# Patient Record
Sex: Male | Born: 1965
Health system: Southern US, Community
[De-identification: ages and names within clinical notes are randomized; demographics above are authoritative.]

## PROBLEM LIST (undated history)

## (undated) DIAGNOSIS — R0789 Other chest pain: Secondary | ICD-10-CM

## (undated) DIAGNOSIS — M8580 Other specified disorders of bone density and structure, unspecified site: Secondary | ICD-10-CM

## (undated) DIAGNOSIS — E669 Obesity, unspecified: Secondary | ICD-10-CM

## (undated) DIAGNOSIS — F17200 Nicotine dependence, unspecified, uncomplicated: Secondary | ICD-10-CM

## (undated) DIAGNOSIS — J45909 Unspecified asthma, uncomplicated: Secondary | ICD-10-CM

## (undated) DIAGNOSIS — K760 Fatty (change of) liver, not elsewhere classified: Secondary | ICD-10-CM

## (undated) DIAGNOSIS — E785 Hyperlipidemia, unspecified: Secondary | ICD-10-CM

## (undated) DIAGNOSIS — I10 Essential (primary) hypertension: Secondary | ICD-10-CM

## (undated) DIAGNOSIS — C6290 Malignant neoplasm of unspecified testis, unspecified whether descended or undescended: Secondary | ICD-10-CM

## (undated) DIAGNOSIS — C439 Malignant melanoma of skin, unspecified: Secondary | ICD-10-CM

## (undated) DIAGNOSIS — Z0000 Encounter for general adult medical examination without abnormal findings: Secondary | ICD-10-CM

## (undated) DIAGNOSIS — F411 Generalized anxiety disorder: Secondary | ICD-10-CM

## (undated) DIAGNOSIS — I517 Cardiomegaly: Secondary | ICD-10-CM

## (undated) DIAGNOSIS — E119 Type 2 diabetes mellitus without complications: Secondary | ICD-10-CM

## (undated) DIAGNOSIS — C4441 Basal cell carcinoma of skin of scalp and neck: Secondary | ICD-10-CM

## (undated) DIAGNOSIS — I38 Endocarditis, valve unspecified: Secondary | ICD-10-CM

## (undated) DIAGNOSIS — M109 Gout, unspecified: Secondary | ICD-10-CM

## (undated) DIAGNOSIS — H9319 Tinnitus, unspecified ear: Secondary | ICD-10-CM

## (undated) HISTORY — DX: Fatty (change of) liver, not elsewhere classified: K76.0

## (undated) HISTORY — DX: Essential (primary) hypertension: I10

## (undated) HISTORY — DX: Type 2 diabetes mellitus without complications: E11.9

## (undated) HISTORY — DX: Generalized anxiety disorder: F41.1

## (undated) HISTORY — DX: Tinnitus, unspecified ear: H93.19

## (undated) HISTORY — DX: Unspecified asthma, uncomplicated: J45.909

## (undated) HISTORY — PX: OTHER SURGICAL HISTORY: SHX169

## (undated) HISTORY — DX: Obesity, unspecified: E66.9

## (undated) HISTORY — DX: Nicotine dependence, unspecified, uncomplicated: F17.200

## (undated) HISTORY — PX: COLONOSCOPY: SHX174

## (undated) HISTORY — PX: POLYPECTOMY: SHX149

## (undated) HISTORY — DX: Basal cell carcinoma of skin of scalp and neck: C44.41

## (undated) HISTORY — DX: Hyperlipidemia, unspecified: E78.5

## (undated) HISTORY — PX: BASAL CELL CARCINOMA EXCISION: SHX1214

## (undated) HISTORY — PX: SKIN CANCER EXCISION: SHX779

## (undated) HISTORY — DX: Other specified disorders of bone density and structure, unspecified site: M85.80

## (undated) HISTORY — DX: Encounter for general adult medical examination without abnormal findings: Z00.00

## (undated) HISTORY — DX: Endocarditis, valve unspecified: I38

## (undated) HISTORY — DX: Other chest pain: R07.89

## (undated) HISTORY — DX: Malignant melanoma of skin, unspecified: C43.9

## (undated) HISTORY — DX: Gout, unspecified: M10.9

## (undated) HISTORY — DX: Malignant neoplasm of unspecified testis, unspecified whether descended or undescended: C62.90

## (undated) HISTORY — DX: Cardiomegaly: I51.7

---
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
## 1997-09-30 HISTORY — PX: SURGERY SCROTAL / TESTICULAR: SUR1316

## 2010-07-03 LAB — HM COLONOSCOPY

## 2012-09-02 ENCOUNTER — Ambulatory Visit (INDEPENDENT_AMBULATORY_CARE_PROVIDER_SITE_OTHER): Payer: BC Managed Care – PPO | Admitting: Family Medicine

## 2012-09-02 VITALS — BP 153/98 | Ht 71.0 in | Wt 225.0 lb

## 2012-09-02 DIAGNOSIS — M79609 Pain in unspecified limb: Secondary | ICD-10-CM

## 2012-09-02 DIAGNOSIS — M79673 Pain in unspecified foot: Secondary | ICD-10-CM

## 2012-09-04 ENCOUNTER — Ambulatory Visit: Payer: BC Managed Care – PPO | Admitting: Internal Medicine

## 2012-09-08 ENCOUNTER — Telehealth: Payer: Self-pay | Admitting: Internal Medicine

## 2012-09-08 ENCOUNTER — Ambulatory Visit (INDEPENDENT_AMBULATORY_CARE_PROVIDER_SITE_OTHER): Payer: BC Managed Care – PPO | Admitting: Internal Medicine

## 2012-09-08 ENCOUNTER — Encounter: Payer: Self-pay | Admitting: Internal Medicine

## 2012-09-08 ENCOUNTER — Encounter: Payer: Self-pay | Admitting: Family Medicine

## 2012-09-08 VITALS — BP 140/82 | HR 66 | Ht 71.25 in | Wt 239.0 lb

## 2012-09-08 DIAGNOSIS — E119 Type 2 diabetes mellitus without complications: Secondary | ICD-10-CM

## 2012-09-08 DIAGNOSIS — C6290 Malignant neoplasm of unspecified testis, unspecified whether descended or undescended: Secondary | ICD-10-CM

## 2012-09-08 DIAGNOSIS — M8580 Other specified disorders of bone density and structure, unspecified site: Secondary | ICD-10-CM

## 2012-09-08 DIAGNOSIS — E559 Vitamin D deficiency, unspecified: Secondary | ICD-10-CM

## 2012-09-08 DIAGNOSIS — M949 Disorder of cartilage, unspecified: Secondary | ICD-10-CM

## 2012-09-08 DIAGNOSIS — M79673 Pain in unspecified foot: Secondary | ICD-10-CM | POA: Insufficient documentation

## 2012-09-08 DIAGNOSIS — I10 Essential (primary) hypertension: Secondary | ICD-10-CM

## 2012-09-08 DIAGNOSIS — E785 Hyperlipidemia, unspecified: Secondary | ICD-10-CM

## 2012-09-08 MED ORDER — METFORMIN HCL 1000 MG PO TABS: 1000.0000 mg | ORAL_TABLET | Freq: Two times a day (BID) | ORAL | Status: DC

## 2012-09-08 MED ORDER — LOSARTAN POTASSIUM-HCTZ 100-25 MG PO TABS: 1.0000 | ORAL_TABLET | Freq: Every day | ORAL | Status: DC

## 2012-09-08 NOTE — Telephone Encounter (Signed)
Lab order

## 2012-09-08 NOTE — Patient Instructions (Signed)
Please return for fasting labs this Thursday morning (cbc, chem7, a1c, urine microalbumin-250.00, lipid/lft-272.4 and vitamin d-vit d def) Also please schedule chem7, a1c-250.0 prior to next visit

## 2012-09-08 NOTE — Assessment & Plan Note (Signed)
most consistent with plantar fasciitis or PF strain.  Much improved today compared to yesterday.  Given this improvement, how injury occurred, fracture or stress fracture extremely unlikely though discussed radiograph possibility.  He would like to wait on imaging as well.  Tylenol, icing, wait a few days before retrying treadmill.  Stretches, heel lifts also discussed.  F/u prn.

## 2012-09-08 NOTE — Addendum Note (Signed)
Addended by: Candie Echevaria L on: 09/08/2012 05:11 PM   Modules accepted: Orders

## 2012-09-08 NOTE — Progress Notes (Signed)
  Subjective:    Patient ID: Steve Beasley, male    DOB: 1966/07/31, 46 y.o.   MRN: [id number]  PCP: None  HPI 46 yo M here for left foot/heel pain  Patient reports he used to be a regular runner for about 10 years then took a break. He had developed stress fractures in his sesamoid bones which led to him stopping running. Then saw another provider who recommended orthotics and stiff shoes and he was able to resume running. Runs 2 times a week a 5K at a time. Also weight trains. States after doing a run on the treadmill on 12/2 (no problems during this) he stepped off the treadmill and felt plantar lateral heel pain. Has known h/o osteopenia as a result of treatment for testicular cancer. Limping yesterday. Was 7/10 pain, now only 2/10 and feels like he's improving.  Past Medical History  Diagnosis Date  . Diabetes mellitus without complication   . Hypertension   . Osteopenia     Current Outpatient Prescriptions on File Prior to Visit  Medication Sig Dispense Refill  . losartan (COZAAR) 50 MG tablet Take 50 mg by mouth daily.      . metFORMIN (GLUCOPHAGE) 1000 MG tablet Take 1,000 mg by mouth 2 (two) times daily with a meal.        Past Surgical History  Procedure Date  . Testicular cancer surgery     No Known Allergies  History   Social History  . Marital Status: Married    Spouse Name: N/A    Number of Children: N/A  . Years of Education: N/A   Occupational History  . Not on file.   Social History Main Topics  . Smoking status: Former Games developer  . Smokeless tobacco: Not on file  . Alcohol Use: Not on file  . Drug Use: Not on file  . Sexually Active: Not on file   Other Topics Concern  . Not on file   Social History Narrative  . No narrative on file    Family History  Problem Relation Age of Onset  . Diabetes Mother   . Hyperlipidemia Mother   . Hypertension Father   . Heart attack Neg Hx   . Sudden death Neg Hx     BP 153/98  Ht 5\' 11"  (1.803 m)   Wt 225 lb (102.059 kg)  BMI 31.38 kg/m2  Review of Systems See HPI above.    Objective:   Physical Exam Gen: NAD  L ankle/foot: No gross deformity, swelling, ecchymoses FROM TTP Negative ant drawer and talar tilt.   Negative syndesmotic compression. Thompsons test negative. NV intact distally.    Assessment & Plan:  1. Left heel pain - most consistent with plantar fasciitis or PF strain.  Much improved today compared to yesterday.  Given this improvement, how injury occurred, fracture or stress fracture extremely unlikely though discussed radiograph possibility.  He would like to wait on imaging as well.  Tylenol, icing, wait a few days before retrying treadmill.  Stretches, heel lifts also discussed.  F/u prn.

## 2012-09-08 NOTE — Telephone Encounter (Signed)
Lab order 09-10-2012 and then future order for week of 11-30-2012           Steve Beasley  Description:  46 year old male  09/08/2012 2:15 PM Office Visit Provider:  Edwyna Perfect, MD  MRN: [id number] Department:  [facility]            Diagnoses  Reason for Visit    DM (diabetes mellitus) - Primary  new to establish discuss health issue   250.00  DM, osteoporosis, hypertension   Testicular cancer      186.9           Vitals - Last Recorded       BP  Pulse  Ht  Wt  BMI  SpO2    140/82  66  5' 11.25" (1.81 m)  239 lb (108.41 kg)  33.10 kg/m2  98%        Vitals History Recorded            Progress Notes     Electronic signature on 09/08/2012          Not recorded            Medications Ordered This Encounter         Disp  Refills  Start  End    losartan-hydrochlorothiazide (HYZAAR) 100-25 MG per tablet  30 tablet  6  09/08/2012      Take 1 tablet by mouth daily. - Oral    metFORMIN (GLUCOPHAGE) 1000 MG tablet  60 tablet  6  09/08/2012      Take 1 tablet (1,000 mg total) by mouth 2 (two) times daily with a meal. - Oral             Discontinued Medications         Reason for Discontinue    losartan (COZAAR) 50 MG tablet      metFORMIN (GLUCOPHAGE) 1000 MG tablet  Reorder             Orders Placed This Encounter     Ambulatory referral to Oncology [[id number] Custom]    Ambulatory referral to Ophthalmology [[id number] Custom]           Patient Instructions     Please return for fasting labs this Thursday morning  (cbc, chem7, a1c, urine microalbumin-250.00, lipid/lft-272.4 and vitamin d-vit d def)  Also please schedule chem7, a1c-250.0 prior to next visit           Follow-up and Disposition     Return in about 3 months (around 12/07/2012).          All Flowsheet Templates (all recorded)     Encounter Vitals Flowsheet   Custom Formula Data Flowsheet   Anthropometrics Flowsheet                                          Other Encounter Related Information          Allergies & Medications      Problem List      History      Patient-Entered Questionnaires      Printed AVS Reports       Printed at    Printed by    09/08/2012 3:16 PM  After Visit Summary  Edwyna Perfect, MD           No data filed  Please return for fasting labs this Thursday morning  (cbc, chem7, a1c, urine microalbumin-250.00, lipid/lft-272.4 and vitamin d-vit d def)  Also please schedule chem7, a1c-250.0 prior to next visit

## 2012-09-10 ENCOUNTER — Other Ambulatory Visit: Payer: Self-pay | Admitting: Internal Medicine

## 2012-09-10 NOTE — Addendum Note (Signed)
Addended by: Mervin Kung A on: 09/10/2012 12:02 PM   Modules accepted: Orders

## 2012-09-10 NOTE — Addendum Note (Signed)
Addended by: Mervin Kung A on: 09/10/2012 11:59 AM   Modules accepted: Orders

## 2012-09-10 NOTE — Telephone Encounter (Signed)
Future lab orders entered and given to the lab for the week of 11/30/12 per 09/08/12 office note below:  Also please schedule chem7, a1c-250.0 prior to next visit

## 2012-09-11 ENCOUNTER — Telehealth: Payer: Self-pay | Admitting: Internal Medicine

## 2012-09-11 LAB — CBC WITH DIFFERENTIAL/PLATELET
Basophils Absolute: 0 10*3/uL (ref 0.0–0.1)
Eosinophils Relative: 1 % (ref 0–5)
HCT: 43.2 % (ref 39.0–52.0)
Hemoglobin: 14.8 g/dL (ref 13.0–17.0)
Lymphocytes Relative: 35 % (ref 12–46)
MCHC: 34.3 g/dL (ref 30.0–36.0)
MCV: 83.4 fL (ref 78.0–100.0)
Monocytes Absolute: 0.3 10*3/uL (ref 0.1–1.0)
Monocytes Relative: 4 % (ref 3–12)
Neutro Abs: 4.4 10*3/uL (ref 1.7–7.7)
RDW: 13.8 % (ref 11.5–15.5)
WBC: 7.4 10*3/uL (ref 4.0–10.5)

## 2012-09-11 LAB — BASIC METABOLIC PANEL
BUN: 22 mg/dL (ref 6–23)
Chloride: 102 mEq/L (ref 96–112)
Creat: 1.26 mg/dL (ref 0.50–1.35)
Glucose, Bld: 129 mg/dL — ABNORMAL HIGH (ref 70–99)
Potassium: 4.5 mEq/L (ref 3.5–5.3)

## 2012-09-11 LAB — HEPATIC FUNCTION PANEL
Alkaline Phosphatase: 77 U/L (ref 39–117)
Bilirubin, Direct: 0.1 mg/dL (ref 0.0–0.3)
Indirect Bilirubin: 0.8 mg/dL (ref 0.0–0.9)
Total Bilirubin: 0.9 mg/dL (ref 0.3–1.2)

## 2012-09-11 LAB — LIPID PANEL
LDL Cholesterol: 103 mg/dL — ABNORMAL HIGH (ref 0–99)
VLDL: 57 mg/dL — ABNORMAL HIGH (ref 0–40)

## 2012-09-11 LAB — MICROALBUMIN / CREATININE URINE RATIO: Microalb Creat Ratio: 24.3 mg/g (ref 0.0–30.0)

## 2012-09-11 LAB — HEMOGLOBIN A1C: Mean Plasma Glucose: 128 mg/dL — ABNORMAL HIGH (ref <117)

## 2012-09-11 LAB — VITAMIN D 25 HYDROXY (VIT D DEFICIENCY, FRACTURES): Vit D, 25-Hydroxy: 23 ng/mL — ABNORMAL LOW (ref 30–89)

## 2012-09-11 NOTE — Telephone Encounter (Signed)
Received medical records from [facility]. Gladis Riffle  P: [phone] F: [phone]

## 2012-09-12 ENCOUNTER — Encounter: Payer: Self-pay | Admitting: Internal Medicine

## 2012-09-12 DIAGNOSIS — I10 Essential (primary) hypertension: Secondary | ICD-10-CM | POA: Insufficient documentation

## 2012-09-12 DIAGNOSIS — E559 Vitamin D deficiency, unspecified: Secondary | ICD-10-CM | POA: Insufficient documentation

## 2012-09-12 DIAGNOSIS — M8580 Other specified disorders of bone density and structure, unspecified site: Secondary | ICD-10-CM | POA: Insufficient documentation

## 2012-09-12 DIAGNOSIS — C6290 Malignant neoplasm of unspecified testis, unspecified whether descended or undescended: Secondary | ICD-10-CM | POA: Insufficient documentation

## 2012-09-12 DIAGNOSIS — E119 Type 2 diabetes mellitus without complications: Secondary | ICD-10-CM | POA: Insufficient documentation

## 2012-09-12 NOTE — Assessment & Plan Note (Signed)
Obtain vitamin d level 

## 2012-09-12 NOTE — Assessment & Plan Note (Signed)
>>  ASSESSMENT AND PLAN FOR HTN (HYPERTENSION) WRITTEN ON 09/12/2012  8:38 PM BY HODGIN, Acie Fredrickson, MD  Mildly suboptimal control. Diet, exercise and weight loss. If elevations persist then consider medication titration

## 2012-09-12 NOTE — Assessment & Plan Note (Signed)
Mildly suboptimal control. Diet, exercise and weight loss. If elevations persist then consider medication titration

## 2012-09-12 NOTE — Assessment & Plan Note (Signed)
Labs bmd ~1.5 years ago

## 2012-09-12 NOTE — Progress Notes (Signed)
  Subjective:    Patient ID: Steve Beasley, male    DOB: 02/19/1966, 46 y.o.   MRN: [id number]  HPI Pt presents to clinic to establish care. Has known h/o testicular cancer without known recurrence. No recent surveillance. Recalls past compression fx and osteopenia noted on BMD. Remembers negative evaluation for secondary causes. fsbs range ~115 without hypoglycemia. Due for diabetic eye exam. H/o past vitamin d deficiency with value of 19 s/p supplementation. BP mildly high. Received flu vaccine already.  Past Medical History  Diagnosis Date  . Diabetes mellitus without complication   . Hypertension   . Osteopenia   . Testicular cancer   . Allergy-induced asthma    Past Surgical History  Procedure Date  . Testicular cancer surgery     reports that he has quit smoking. He does not have any smokeless tobacco history on file. He reports that he drinks alcohol. He reports that he does not use illicit drugs. family history includes Bone cancer in his mother; Breast cancer in his mother; Diabetes in his mother; Hyperlipidemia in his mother; Hypertension in his father; Prostate cancer in his father and maternal grandfather; and Stroke in his paternal grandfather.  There is no history of Heart attack and Sudden death. No Known Allergies   Review of Systems  Respiratory: Negative for shortness of breath.   Cardiovascular: Negative for chest pain.  All other systems reviewed and are negative.       Objective:   Physical Exam  Nursing note and vitals reviewed. Constitutional: He appears well-developed and well-nourished. No distress.  HENT:  Head: Normocephalic and atraumatic.  Right Ear: External ear normal.  Left Ear: External ear normal.  Mouth/Throat: Oropharynx is clear and moist. No oropharyngeal exudate.  Eyes: Conjunctivae normal and EOM are normal. Pupils are equal, round, and reactive to light. No scleral icterus.  Neck: Neck supple. No thyromegaly present.  Cardiovascular:  Normal rate, regular rhythm and normal heart sounds.  Exam reveals no gallop and no friction rub.   No murmur heard. Pulmonary/Chest: Effort normal and breath sounds normal. No respiratory distress. He has no wheezes. He has no rales.  Lymphadenopathy:    He has no cervical adenopathy.  Neurological: He is alert.  Skin: Skin is warm and dry. He is not diaphoretic.  Psychiatric: He has a normal mood and affect.          Assessment & Plan:

## 2012-09-12 NOTE — Assessment & Plan Note (Signed)
Arrange for oncology surveillance.

## 2012-09-12 NOTE — Assessment & Plan Note (Signed)
Obtain cbc, chem7, a1c, tsh and urine microalbumin. Schedule diabetic eye exam. Encourage diabetic diet, regular exercise and weight loss

## 2012-09-16 ENCOUNTER — Telehealth: Payer: Self-pay | Admitting: *Deleted

## 2012-09-16 DIAGNOSIS — E559 Vitamin D deficiency, unspecified: Secondary | ICD-10-CM

## 2012-09-16 NOTE — Telephone Encounter (Signed)
LMOM with contact name & number for return call RE: results & further provider instructions; Future lab order placed/SLS

## 2012-09-16 NOTE — Telephone Encounter (Signed)
Message copied by Regis Bill on Wed Sep 16, 2012  3:51 PM ------      Message from: Edwyna Perfect      Created: Tue Sep 15, 2012  8:10 PM       a1c 6.1. tg mildy high. Low fat diet. Vit d low. Take otc vit d 2000 units qd. Add vit d 25 to next labs dx-vit d def

## 2012-10-01 ENCOUNTER — Ambulatory Visit: Payer: BC Managed Care – PPO

## 2012-10-01 ENCOUNTER — Other Ambulatory Visit: Payer: BC Managed Care – PPO | Admitting: Lab

## 2012-10-01 ENCOUNTER — Telehealth: Payer: Self-pay | Admitting: Hematology & Oncology

## 2012-10-01 ENCOUNTER — Encounter: Payer: BC Managed Care – PPO | Admitting: Hematology & Oncology

## 2012-10-01 NOTE — Telephone Encounter (Signed)
Pt will call back to reschedule no show from today. Helen at referring aware pt was a no show

## 2012-10-06 NOTE — Telephone Encounter (Signed)
Patient informed, understood & agreed/SLS  

## 2012-11-23 ENCOUNTER — Encounter: Payer: Self-pay | Admitting: Internal Medicine

## 2012-12-07 ENCOUNTER — Ambulatory Visit: Payer: BC Managed Care – PPO | Admitting: Internal Medicine

## 2013-04-05 ENCOUNTER — Telehealth: Payer: Self-pay | Admitting: Internal Medicine

## 2013-04-05 MED ORDER — LOSARTAN POTASSIUM-HCTZ 100-25 MG PO TABS: 1.0000 | ORAL_TABLET | Freq: Every day | ORAL | Status: DC

## 2013-04-05 NOTE — Telephone Encounter (Signed)
Refill- losartan potassium hctz 100-25mg . Take one tablet by mouth one time daily. Qty 30 last fill 6.11.14

## 2013-04-05 NOTE — Telephone Encounter (Signed)
Rx request to pharmacy; **Office Visit Needed Prior to Future Refills**/SLS  

## 2013-05-20 ENCOUNTER — Other Ambulatory Visit: Payer: Self-pay | Admitting: Family Medicine

## 2013-05-21 NOTE — Telephone Encounter (Signed)
Left a message for pt to return my call. 15 tablets sent to pharmacy. Pt needs appt before any addt refills will be done.

## 2013-06-02 ENCOUNTER — Telehealth: Payer: Self-pay | Admitting: Family Medicine

## 2013-06-02 ENCOUNTER — Ambulatory Visit (INDEPENDENT_AMBULATORY_CARE_PROVIDER_SITE_OTHER): Payer: BC Managed Care – PPO | Admitting: Family Medicine

## 2013-06-02 ENCOUNTER — Encounter: Payer: Self-pay | Admitting: Family Medicine

## 2013-06-02 VITALS — BP 132/92 | HR 67 | Temp 97.9°F | Ht 71.0 in | Wt 236.1 lb

## 2013-06-02 DIAGNOSIS — E119 Type 2 diabetes mellitus without complications: Secondary | ICD-10-CM

## 2013-06-02 DIAGNOSIS — I10 Essential (primary) hypertension: Secondary | ICD-10-CM

## 2013-06-02 DIAGNOSIS — Z789 Other specified health status: Secondary | ICD-10-CM

## 2013-06-02 DIAGNOSIS — E559 Vitamin D deficiency, unspecified: Secondary | ICD-10-CM

## 2013-06-02 DIAGNOSIS — M79609 Pain in unspecified limb: Secondary | ICD-10-CM

## 2013-06-02 DIAGNOSIS — M8580 Other specified disorders of bone density and structure, unspecified site: Secondary | ICD-10-CM

## 2013-06-02 DIAGNOSIS — C6290 Malignant neoplasm of unspecified testis, unspecified whether descended or undescended: Secondary | ICD-10-CM

## 2013-06-02 DIAGNOSIS — M899 Disorder of bone, unspecified: Secondary | ICD-10-CM

## 2013-06-02 DIAGNOSIS — Z0000 Encounter for general adult medical examination without abnormal findings: Secondary | ICD-10-CM

## 2013-06-02 DIAGNOSIS — M79673 Pain in unspecified foot: Secondary | ICD-10-CM

## 2013-06-02 DIAGNOSIS — F17200 Nicotine dependence, unspecified, uncomplicated: Secondary | ICD-10-CM

## 2013-06-02 DIAGNOSIS — E785 Hyperlipidemia, unspecified: Secondary | ICD-10-CM

## 2013-06-02 DIAGNOSIS — E669 Obesity, unspecified: Secondary | ICD-10-CM

## 2013-06-02 LAB — RENAL FUNCTION PANEL
BUN: 19 mg/dL (ref 6–23)
CO2: 30 mEq/L (ref 19–32)
Chloride: 99 mEq/L (ref 96–112)
Creat: 1.17 mg/dL (ref 0.50–1.35)
Glucose, Bld: 125 mg/dL — ABNORMAL HIGH (ref 70–99)
Phosphorus: 1.5 mg/dL — ABNORMAL LOW (ref 2.3–4.6)
Potassium: 4 mEq/L (ref 3.5–5.3)

## 2013-06-02 LAB — LIPID PANEL
LDL Cholesterol: 95 mg/dL (ref 0–99)
Triglycerides: 288 mg/dL — ABNORMAL HIGH (ref <150)
VLDL: 58 mg/dL — ABNORMAL HIGH (ref 0–40)

## 2013-06-02 LAB — CBC
Platelets: 160 10*3/uL (ref 150–400)
RDW: 13.8 % (ref 11.5–15.5)
WBC: 8.6 10*3/uL (ref 4.0–10.5)

## 2013-06-02 LAB — HEPATIC FUNCTION PANEL
Alkaline Phosphatase: 92 U/L (ref 39–117)
Bilirubin, Direct: 0.2 mg/dL (ref 0.0–0.3)
Indirect Bilirubin: 1 mg/dL — ABNORMAL HIGH (ref 0.0–0.9)
Total Protein: 7 g/dL (ref 6.0–8.3)

## 2013-06-02 LAB — HEMOGLOBIN A1C: Mean Plasma Glucose: 137 mg/dL — ABNORMAL HIGH (ref <117)

## 2013-06-02 MED ORDER — CIPROFLOXACIN HCL 500 MG PO TABS: 500.0000 mg | ORAL_TABLET | Freq: Two times a day (BID) | ORAL | Status: DC

## 2013-06-02 MED ORDER — LOSARTAN POTASSIUM-HCTZ 100-25 MG PO TABS: 1.0000 | ORAL_TABLET | Freq: Every day | ORAL | Status: DC

## 2013-06-02 NOTE — Telephone Encounter (Signed)
LAB ORDER WEEK OF 08-30-2013 Annual exam prior to visit liver, renal, cbc, tsh, hgba1c

## 2013-06-02 NOTE — Patient Instructions (Addendum)
DASH Diet  The DASH diet stands for "Dietary Approaches to Stop Hypertension." It is a healthy eating plan that has been shown to reduce high blood pressure (hypertension) in as little as 14 days, while also possibly providing other significant health benefits. These other health benefits include reducing the risk of breast cancer after menopause and reducing the risk of type 2 diabetes, heart disease, colon cancer, and stroke. Health benefits also include weight loss and slowing kidney failure in patients with chronic kidney disease.   DIET GUIDELINES  · Limit salt (sodium). Your diet should contain less than 1500 mg of sodium daily.  · Limit refined or processed carbohydrates. Your diet should include mostly whole grains. Desserts and added sugars should be used sparingly.  · Include small amounts of heart-healthy fats. These types of fats include nuts, oils, and tub margarine. Limit saturated and trans fats. These fats have been shown to be harmful in the body.  CHOOSING FOODS   The following food groups are based on a 2000 calorie diet. See your Registered Dietitian for individual calorie needs.  Grains and Grain Products (6 to 8 servings daily)  · Eat More Often: Whole-wheat bread, brown rice, whole-grain or wheat pasta, quinoa, popcorn without added fat or salt (air popped).  · Eat Less Often: White bread, white pasta, white rice, cornbread.  Vegetables (4 to 5 servings daily)  · Eat More Often: Fresh, frozen, and canned vegetables. Vegetables may be raw, steamed, roasted, or grilled with a minimal amount of fat.  · Eat Less Often/Avoid: Creamed or fried vegetables. Vegetables in a cheese sauce.  Fruit (4 to 5 servings daily)  · Eat More Often: All fresh, canned (in natural juice), or frozen fruits. Dried fruits without added sugar. One hundred percent fruit juice (½ cup [237 mL] daily).  · Eat Less Often: Dried fruits with added sugar. Canned fruit in light or heavy syrup.  Lean Meats, Fish, and Poultry (2  servings or less daily. One serving is 3 to 4 oz [85-114 g]).  · Eat More Often: Ninety percent or leaner ground beef, tenderloin, sirloin. Round cuts of beef, chicken breast, turkey breast. All fish. Grill, bake, or broil your meat. Nothing should be fried.  · Eat Less Often/Avoid: Fatty cuts of meat, turkey, or chicken leg, thigh, or wing. Fried cuts of meat or fish.  Dairy (2 to 3 servings)  · Eat More Often: Low-fat or fat-free milk, low-fat plain or light yogurt, reduced-fat or part-skim cheese.  · Eat Less Often/Avoid: Milk (whole, 2%). Whole milk yogurt. Full-fat cheeses.  Nuts, Seeds, and Legumes (4 to 5 servings per week)  · Eat More Often: All without added salt.  · Eat Less Often/Avoid: Salted nuts and seeds, canned beans with added salt.  Fats and Sweets (limited)  · Eat More Often: Vegetable oils, tub margarines without trans fats, sugar-free gelatin. Mayonnaise and salad dressings.  · Eat Less Often/Avoid: Coconut oils, palm oils, butter, stick margarine, cream, half and half, cookies, candy, pie.  FOR MORE INFORMATION  The Dash Diet Eating Plan: www.dashdiet.org  Document Released: 09/05/2011 Document Revised: 12/09/2011 Document Reviewed: 09/05/2011  Exit[employer]® Patient Information ©2014 [employer].

## 2013-06-03 LAB — VITAMIN D 25 HYDROXY (VIT D DEFICIENCY, FRACTURES): Vit D, 25-Hydroxy: 30 ng/mL (ref 30–89)

## 2013-06-03 NOTE — Telephone Encounter (Signed)
Lab order placed.

## 2013-06-06 ENCOUNTER — Encounter: Payer: Self-pay | Admitting: Family Medicine

## 2013-06-06 DIAGNOSIS — F17200 Nicotine dependence, unspecified, uncomplicated: Secondary | ICD-10-CM

## 2013-06-06 DIAGNOSIS — E669 Obesity, unspecified: Secondary | ICD-10-CM

## 2013-06-06 DIAGNOSIS — E663 Overweight: Secondary | ICD-10-CM | POA: Insufficient documentation

## 2013-06-06 HISTORY — DX: Obesity, unspecified: E66.9

## 2013-06-06 HISTORY — DX: Nicotine dependence, unspecified, uncomplicated: F17.200

## 2013-06-06 NOTE — Assessment & Plan Note (Signed)
>>  ASSESSMENT AND PLAN FOR HTN (HYPERTENSION) WRITTEN ON 06/06/2013 11:05 AM BY BLYTH, STACEY A, MD  Well controlled at today's visit but has been fluctuant elsewhere. No changes for now.

## 2013-06-06 NOTE — Assessment & Plan Note (Signed)
Encouraged DASH diet, avoid trans fats, increase exercise.

## 2013-06-06 NOTE — Assessment & Plan Note (Signed)
Well controlled at today's visit but has been fluctuant elsewhere. No changes for now.

## 2013-06-06 NOTE — Assessment & Plan Note (Signed)
Avoid simple carbs. Continue metformin and monitor

## 2013-06-06 NOTE — Assessment & Plan Note (Signed)
Taking vitamin D 2000IU daily

## 2013-06-06 NOTE — Assessment & Plan Note (Signed)
Encouraged complete cessation. 

## 2013-06-06 NOTE — Assessment & Plan Note (Signed)
Check vitamin d, Citracal bid.

## 2013-06-06 NOTE — Assessment & Plan Note (Signed)
Doing well 

## 2013-06-06 NOTE — Assessment & Plan Note (Signed)
Good foot wear, inserts, ice and Aspercreme stretching.

## 2013-06-06 NOTE — Progress Notes (Signed)
Patient ID: Steve Beasley, male   DOB: [birth year], 47 y.o.   MRN: [id number] MANDY FITZWATER [id number] Sep 27, 1966 06/06/2013      Progress Note-Follow Up  Subjective  Chief Complaint  Chief Complaint  Patient presents with  . Follow-up    HPI  Patient is a 47 year old male who is in today for followup. He is generally doing well. Continues to struggle with heel pain but is manageable. No recent illness. Denies any chest pain or palpitations. No new fractures. No fevers or chills. No shortness or breath GI or GU concerns noted today. Taking medications as prescribed and to take his medications today  Past Medical History  Diagnosis Date  . Diabetes mellitus without complication   . Hypertension   . Osteopenia   . Testicular cancer   . Allergy-induced asthma   . Obesity, unspecified 06/06/2013    Past Surgical History  Procedure Laterality Date  . Testicular cancer surgery      Family History  Problem Relation Age of Onset  . Diabetes Mother   . Hyperlipidemia Mother   . Hypertension Father   . Heart attack Neg Hx   . Sudden death Neg Hx   . Stroke Paternal Grandfather   . Prostate cancer Father   . Prostate cancer Maternal Grandfather   . Breast cancer Mother   . Bone cancer Mother     History   Social History  . Marital Status: Married    Spouse Name: N/A    Number of Children: N/A  . Years of Education: N/A   Occupational History  . Not on file.   Social History Main Topics  . Smoking status: Former Games developer  . Smokeless tobacco: Not on file  . Alcohol Use: Yes  . Drug Use: No  . Sexual Activity: Not on file   Other Topics Concern  . Not on file   Social History Narrative  . No narrative on file    Current Outpatient Prescriptions on File Prior to Visit  Medication Sig Dispense Refill  . metFORMIN (GLUCOPHAGE) 1000 MG tablet Take 1 tablet (1,000 mg total) by mouth 2 (two) times daily with a meal.  60 tablet  6  . Probiotic Product (ALIGN) 4 MG  CAPS Take 1 tablet by mouth.       No current facility-administered medications on file prior to visit.    No Known Allergies  Review of Systems  Review of Systems  Constitutional: Negative for fever and malaise/fatigue.  HENT: Negative for congestion.   Eyes: Negative for discharge.  Respiratory: Negative for shortness of breath.   Cardiovascular: Negative for chest pain, palpitations and leg swelling.  Gastrointestinal: Negative for nausea, abdominal pain and diarrhea.  Genitourinary: Negative for dysuria.  Musculoskeletal: Negative for falls.  Skin: Negative for rash.  Neurological: Negative for loss of consciousness and headaches.  Endo/Heme/Allergies: Negative for polydipsia.  Psychiatric/Behavioral: Negative for depression and suicidal ideas. The patient is not nervous/anxious and does not have insomnia.     Objective  BP 132/92  Pulse 67  Temp(Src) 97.9 F (36.6 C) (Oral)  Ht 5\' 11"  (1.803 m)  Wt 236 lb 1.3 oz (107.085 kg)  BMI 32.94 kg/m2  SpO2 97%  Physical Exam  Physical Exam  Constitutional: He is oriented to person, place, and time and well-developed, well-nourished, and in no distress. No distress.  HENT:  Head: Normocephalic and atraumatic.  Eyes: Conjunctivae are normal.  Neck: Neck supple. No thyromegaly present.  Cardiovascular: Normal rate, regular  rhythm and normal heart sounds.   No murmur heard. Pulmonary/Chest: Effort normal and breath sounds normal. No respiratory distress.  Abdominal: He exhibits no distension and no mass. There is no tenderness.  Musculoskeletal: He exhibits no edema.  Neurological: He is alert and oriented to person, place, and time.  Skin: Skin is warm.  Psychiatric: Memory, affect and judgment normal.    Lab Results  Component Value Date   TSH 1.553 06/02/2013   Lab Results  Component Value Date   WBC 8.6 06/02/2013   HGB 15.1 06/02/2013   HCT 43.2 06/02/2013   MCV 83.6 06/02/2013   PLT 160 06/02/2013   Lab Results   Component Value Date   CREATININE 1.17 06/02/2013   BUN 19 06/02/2013   NA 136 06/02/2013   K 4.0 06/02/2013   CL 99 06/02/2013   CO2 30 06/02/2013   Lab Results  Component Value Date   ALT 52 06/02/2013   AST 29 06/02/2013   ALKPHOS 92 06/02/2013   BILITOT 1.2 06/02/2013   Lab Results  Component Value Date   CHOL 189 06/02/2013   Lab Results  Component Value Date   HDL 36* 06/02/2013   Lab Results  Component Value Date   LDLCALC 95 06/02/2013   Lab Results  Component Value Date   TRIG 288* 06/02/2013   Lab Results  Component Value Date   CHOLHDL 5.3 06/02/2013     Assessment & Plan  Osteopenia Check vitamin d, Citracal bid.   Vitamin D deficiency Taking vitamin D 2000IU daily  Heel pain Good foot wear, inserts, ice and Aspercreme stretching.   HTN (hypertension) Well controlled at today's visit but has been fluctuant elsewhere. No changes for now.   Obesity, unspecified Encouraged DASH diet, avoid trans fats, increase exercise.   Testicular cancer Doing well.  DM (diabetes mellitus), type 2 Avoid simple carbs. Continue metformin and monitor  Nicotine use disorder Encouraged complete cessation.

## 2013-06-14 MED ORDER — ATORVASTATIN CALCIUM 10 MG PO TABS: 10.0000 mg | ORAL_TABLET | Freq: Every day | ORAL | Status: DC

## 2013-06-14 NOTE — Addendum Note (Signed)
Addended by: Court Joy on: 06/14/2013 12:27 PM   Modules accepted: Orders

## 2013-06-14 NOTE — Progress Notes (Signed)
Quick Note:  Patient Informed and voiced understanding.  RX sent to pharmacy. Pt is in [place] so he won't be able to pick this up until next week ______

## 2013-06-28 NOTE — Progress Notes (Signed)
This encounter was created in error - please disregard.

## 2013-07-12 ENCOUNTER — Ambulatory Visit (INDEPENDENT_AMBULATORY_CARE_PROVIDER_SITE_OTHER): Payer: BC Managed Care – PPO | Admitting: Physician Assistant

## 2013-07-12 ENCOUNTER — Encounter: Payer: Self-pay | Admitting: Physician Assistant

## 2013-07-12 ENCOUNTER — Ambulatory Visit (HOSPITAL_BASED_OUTPATIENT_CLINIC_OR_DEPARTMENT_OTHER)
Admission: RE | Admit: 2013-07-12 | Discharge: 2013-07-12 | Disposition: A | Discharge status: Home / Self Care | Payer: BC Managed Care – PPO | Source: Ambulatory Visit | Attending: Physician Assistant | Admitting: Physician Assistant

## 2013-07-12 VITALS — BP 126/80 | HR 64 | Temp 98.8°F | Resp 16 | Ht 71.0 in | Wt 239.3 lb

## 2013-07-12 DIAGNOSIS — Z8547 Personal history of malignant neoplasm of testis: Secondary | ICD-10-CM | POA: Insufficient documentation

## 2013-07-12 DIAGNOSIS — R1011 Right upper quadrant pain: Secondary | ICD-10-CM | POA: Insufficient documentation

## 2013-07-12 DIAGNOSIS — R109 Unspecified abdominal pain: Secondary | ICD-10-CM | POA: Insufficient documentation

## 2013-07-12 LAB — CBC WITH DIFFERENTIAL/PLATELET
Eosinophils Relative: 1 % (ref 0–5)
HCT: 40.8 % (ref 39.0–52.0)
Lymphocytes Relative: 25 % (ref 12–46)
Lymphs Abs: 2.1 10*3/uL (ref 0.7–4.0)
MCV: 82.3 fL (ref 78.0–100.0)
Monocytes Absolute: 0.7 10*3/uL (ref 0.1–1.0)
Monocytes Relative: 7 % (ref 3–12)
RBC: 4.96 MIL/uL (ref 4.22–5.81)
RDW: 13.5 % (ref 11.5–15.5)
WBC: 8.7 10*3/uL (ref 4.0–10.5)

## 2013-07-12 NOTE — Progress Notes (Signed)
Patient ID: Steve Beasley, male   DOB: 05-04-1966, 47 y.o.   MRN: [id number]  Patient presents to clinic today c/o RUQ pain first noticed last Friday evening.  Patient states pain is sharp and is non-radiating.  Pain is usually 5/10 and intermittent.  Pain is worse after meals.  Associated with several episodes of diarrhea Friday evening.  No diarrhea since.  Patient endorses mild nausea with pain but denies emesis.  Denies history of gallstone or kidney stone.  Has significant family history of gallstones.  Denies excessive alcohol consumption.  Denies fever, chills, sweats.  Does endorse poor diet.  Denies chest pain, palpitations or shortness of breath.  Denies flank pain, dysuria, urgency or frequency.  Denies melena or hematochezia.   Past Medical History  Diagnosis Date  . Diabetes mellitus without complication   . Hypertension   . Osteopenia   . Testicular cancer   . Allergy-induced asthma   . Obesity, unspecified 06/06/2013  . Nicotine use disorder 06/06/2013    smokeless    Current Outpatient Prescriptions on File Prior to Visit  Medication Sig Dispense Refill  . losartan-hydrochlorothiazide (HYZAAR) 100-25 MG per tablet Take 1 tablet by mouth daily.  30 tablet  5  . metFORMIN (GLUCOPHAGE) 1000 MG tablet Take 1 tablet (1,000 mg total) by mouth 2 (two) times daily with a meal.  60 tablet  6  . Probiotic Product (ALIGN) 4 MG CAPS Take 1 tablet by mouth.      Marland Kitchen atorvastatin (LIPITOR) 10 MG tablet Take 1 tablet (10 mg total) by mouth at bedtime.  30 tablet  3   No current facility-administered medications on file prior to visit.    No Known Allergies  Family History  Problem Relation Age of Onset  . Diabetes Mother   . Hyperlipidemia Mother   . Hypertension Father   . Heart attack Neg Hx   . Sudden death Neg Hx   . Stroke Paternal Grandfather   . Prostate cancer Father   . Prostate cancer Maternal Grandfather   . Breast cancer Mother   . Bone cancer Mother     History    Social History  . Marital Status: Married    Spouse Name: N/A    Number of Children: N/A  . Years of Education: N/A   Social History Main Topics  . Smoking status: Former Games developer  . Smokeless tobacco: None  . Alcohol Use: Yes  . Drug Use: No  . Sexual Activity: None   Other Topics Concern  . None   Social History Narrative  . None   ROS See HPI.  All other ROS are negative.  Filed Vitals:   07/12/13 1456  BP: 126/80  Pulse: 64  Temp: 98.8 F (37.1 C)  Resp: 16    Physical Exam  Vitals reviewed. Constitutional: He is oriented to person, place, and time and well-developed, well-nourished, and in no distress.  HENT:  Head: Normocephalic and atraumatic.  Eyes: Conjunctivae are normal.  Neck: Neck supple.  Cardiovascular: Normal rate, regular rhythm, normal heart sounds and intact distal pulses.   Pulmonary/Chest: Effort normal and breath sounds normal. No respiratory distress. He has no wheezes. He has no rales. He exhibits no tenderness.  Abdominal: Soft. Bowel sounds are normal. He exhibits no distension and no mass. There is no rebound and no guarding.  RUQ tenderness to palpation.  Murphy sign negative.  Lymphadenopathy:    He has no cervical adenopathy.  Neurological: He is alert and oriented  to person, place, and time. No cranial nerve deficit.  Skin: Skin is warm and dry. No rash noted.  Psychiatric: Affect normal.     Recent Results (from the past 2160 hour(s))  HEMOGLOBIN A1C     Status: Abnormal   Collection Time    06/02/13 10:38 AM      Result Value Range   Hemoglobin A1C 6.4 (*) <5.7 %   Comment:                                                                            According to the ADA Clinical Practice Recommendations for 2011, when     HbA1c is used as a screening test:             >=6.5%   Diagnostic of Diabetes Mellitus                (if abnormal result is confirmed)           5.7-6.4%   Increased risk of developing Diabetes  [place]           References:Diagnosis and Classification of Diabetes Mellitus,Diabetes     Care,2011,34(Suppl 1):S62-S69 and Standards of Medical Care in             Diabetes - 2011,Diabetes Care,2011,34 (Suppl 1):S11-S61.         Mean Plasma Glucose 137 (*) <117 mg/dL  LIPID PANEL     Status: Abnormal   Collection Time    06/02/13 10:38 AM      Result Value Range   Cholesterol 189  0 - 200 mg/dL   Comment: ATP III Classification:           < 200        mg/dL        Desirable          200 - 239     mg/dL        Borderline High          >= 240        mg/dL        High         Triglycerides 288 (*) <150 mg/dL   HDL 36 (*) >40 mg/dL   Total CHOL/HDL Ratio 5.3     VLDL 58 (*) 0 - 40 mg/dL   LDL Cholesterol 95  0 - 99 mg/dL   Comment:       Total Cholesterol/HDL Ratio:CHD Risk                            Coronary Heart Disease Risk Table                                            Men       Women              1/2 Average Risk              3.4        3.3  Average Risk              5.0        4.4               2X Average Risk              9.6        7.1               3X Average Risk             23.4       11.0     Use the calculated Patient Ratio above and the CHD Risk table      to determine the patient's CHD Risk.     ATP III Classification (LDL):           < 100        mg/dL         Optimal          100 - 129     mg/dL         Near or Above Optimal          130 - 159     mg/dL         Borderline High          160 - 189     mg/dL         High           > 190        mg/dL         Very High        HEPATIC FUNCTION PANEL     Status: Abnormal   Collection Time    06/02/13 10:38 AM      Result Value Range   Total Bilirubin 1.2  0.3 - 1.2 mg/dL   Bilirubin, Direct 0.2  0.0 - 0.3 mg/dL   Indirect Bilirubin 1.0 (*) 0.0 - 0.9 mg/dL   Alkaline Phosphatase 92  39 - 117 U/L   AST 29  0 - 37 U/L   ALT 52  0 - 53 U/L   Total Protein 7.0  6.0 - 8.3 g/dL   Albumin 4.3   3.5 - 5.2 g/dL  RENAL FUNCTION PANEL     Status: Abnormal   Collection Time    06/02/13 10:38 AM      Result Value Range   Sodium 136  135 - 145 mEq/L   Potassium 4.0  3.5 - 5.3 mEq/L   Chloride 99  96 - 112 mEq/L   CO2 30  19 - 32 mEq/L   Glucose, Bld 125 (*) 70 - 99 mg/dL   BUN 19  6 - 23 mg/dL   Creat 9.60  4.54 - 0.98 mg/dL   Albumin 4.3  3.5 - 5.2 g/dL   Calcium 11.9  8.4 - 14.7 mg/dL   Phosphorus 1.5 (*) 2.3 - 4.6 mg/dL  VITAMIN D 25 HYDROXY     Status: None   Collection Time    06/02/13 10:38 AM      Result Value Range   Vit D, 25-Hydroxy 30  30 - 89 ng/mL   Comment: This assay accurately quantifies Vitamin D, which is the sum of the     25-Hydroxy forms of Vitamin D2 and D3.  Studies have shown that the     optimum concentration of 25-Hydroxy Vitamin D is 30 ng/mL or higher.      Concentrations of  Vitamin D between 20 and 29 ng/mL are considered to     be insufficient and concentrations less than 20 ng/mL are considered     to be deficient for Vitamin D.  CBC     Status: None   Collection Time    06/02/13 10:38 AM      Result Value Range   WBC 8.6  4.0 - 10.5 K/uL   RBC 5.17  4.22 - 5.81 MIL/uL   Hemoglobin 15.1  13.0 - 17.0 g/dL   HCT 30.8  65.7 - 84.6 %   MCV 83.6  78.0 - 100.0 fL   MCH 29.2  26.0 - 34.0 pg   MCHC 35.0  30.0 - 36.0 g/dL   RDW 96.2  95.2 - 84.1 %   Platelets 160  150 - 400 K/uL  TSH     Status: None   Collection Time    06/02/13 10:38 AM      Result Value Range   TSH 1.553  0.350 - 4.500 uIU/mL    Assessment/Plan: RUQ pain Will obtain labs to include CBC, CMP, Lipase.  Abdominal US.  Increase fiber intake, daily probiotic.  BRAT diet for now.  Will tailor treatment depending on labs/imaging studies.

## 2013-07-12 NOTE — Patient Instructions (Signed)
Please obtain labs.  Stop by front desk after labs for instructions about ultrasound.  I will call you with all of your results and we will treat you accordingly.  Also I would advise a bland diet over the next couple of days -- read information below.  Take a daily fiber supplement.  B.R.A.T. Diet Your doctor has recommended the B.R.A.T. diet for you or your child until the condition improves. This is often used to help control diarrhea and vomiting symptoms. If you or your child can tolerate clear liquids, you may have:  Bananas.   Rice.   Applesauce.   Toast (and other simple starches such as crackers, potatoes, noodles).  Be sure to avoid dairy products, meats, and fatty foods until symptoms are better. Fruit juices such as apple, grape, and prune juice can make diarrhea worse. Avoid these. Continue this diet for 2 days or as instructed by your caregiver. Document Released: 09/16/2005 Document Revised: 09/05/2011 Document Reviewed: 03/05/2007 [facility] Patient Information 2012 [place], [place].

## 2013-07-12 NOTE — Assessment & Plan Note (Signed)
Will obtain labs to include CBC, CMP, Lipase.  Abdominal US.  Increase fiber intake, daily probiotic.  BRAT diet for now.  Will tailor treatment depending on labs/imaging studies.

## 2013-07-13 ENCOUNTER — Telehealth: Payer: Self-pay | Admitting: Physician Assistant

## 2013-07-13 LAB — COMPREHENSIVE METABOLIC PANEL
BUN: 15 mg/dL (ref 6–23)
CO2: 31 mEq/L (ref 19–32)
Calcium: 10.3 mg/dL (ref 8.4–10.5)
Chloride: 100 mEq/L (ref 96–112)
Creat: 1.16 mg/dL (ref 0.50–1.35)
Glucose, Bld: 153 mg/dL — ABNORMAL HIGH (ref 70–99)

## 2013-07-13 LAB — URINALYSIS, ROUTINE W REFLEX MICROSCOPIC
Ketones, ur: NEGATIVE mg/dL
Nitrite: NEGATIVE
Protein, ur: NEGATIVE mg/dL
Specific Gravity, Urine: 1.014 (ref 1.005–1.030)
Urobilinogen, UA: 0.2 mg/dL (ref 0.0–1.0)

## 2013-07-13 LAB — LIPASE: Lipase: 46 U/L (ref 0–75)

## 2013-07-13 NOTE — Telephone Encounter (Signed)
Called patient and discussed results with patient.  All labs and abdominal US were negatvie for acute worrisome finding.  Liver did demonstrate some fatty changes.  Patient on lipitor as prescribed by Dr. Abner Greenspan.  Discussed diet and exercise with patient.  Patient states pain is gone, but there is mild lingering soreness.  Patient instructed to return to clinic if symptoms recur as further imaging may be needed.  Encourage bland diet over the next few days.  Encourage probiotic and fiber supplement.

## 2013-08-02 ENCOUNTER — Other Ambulatory Visit: Payer: Self-pay | Admitting: *Deleted

## 2013-08-02 MED ORDER — METFORMIN HCL 1000 MG PO TABS: 1000.0000 mg | ORAL_TABLET | Freq: Two times a day (BID) | ORAL | Status: DC

## 2013-08-02 NOTE — Telephone Encounter (Signed)
Rx request to pharmacy/[facility]  

## 2013-08-05 ENCOUNTER — Other Ambulatory Visit: Payer: Self-pay

## 2013-09-13 ENCOUNTER — Ambulatory Visit (INDEPENDENT_AMBULATORY_CARE_PROVIDER_SITE_OTHER): Payer: BC Managed Care – PPO | Admitting: Family Medicine

## 2013-09-13 ENCOUNTER — Ambulatory Visit (HOSPITAL_BASED_OUTPATIENT_CLINIC_OR_DEPARTMENT_OTHER)
Admission: RE | Admit: 2013-09-13 | Discharge: 2013-09-13 | Disposition: A | Discharge status: Home / Self Care | Payer: BC Managed Care – PPO | Source: Ambulatory Visit | Attending: Family Medicine | Admitting: Family Medicine

## 2013-09-13 ENCOUNTER — Encounter: Payer: Self-pay | Admitting: Family Medicine

## 2013-09-13 VITALS — BP 142/82 | HR 97 | Temp 97.5°F | Ht 71.0 in | Wt 221.1 lb

## 2013-09-13 DIAGNOSIS — K7689 Other specified diseases of liver: Secondary | ICD-10-CM

## 2013-09-13 DIAGNOSIS — F17200 Nicotine dependence, unspecified, uncomplicated: Secondary | ICD-10-CM

## 2013-09-13 DIAGNOSIS — K760 Fatty (change of) liver, not elsewhere classified: Secondary | ICD-10-CM

## 2013-09-13 DIAGNOSIS — M8580 Other specified disorders of bone density and structure, unspecified site: Secondary | ICD-10-CM

## 2013-09-13 DIAGNOSIS — Z0000 Encounter for general adult medical examination without abnormal findings: Secondary | ICD-10-CM

## 2013-09-13 DIAGNOSIS — J209 Acute bronchitis, unspecified: Secondary | ICD-10-CM

## 2013-09-13 DIAGNOSIS — E669 Obesity, unspecified: Secondary | ICD-10-CM

## 2013-09-13 DIAGNOSIS — J45909 Unspecified asthma, uncomplicated: Secondary | ICD-10-CM

## 2013-09-13 DIAGNOSIS — F411 Generalized anxiety disorder: Secondary | ICD-10-CM

## 2013-09-13 DIAGNOSIS — E119 Type 2 diabetes mellitus without complications: Secondary | ICD-10-CM

## 2013-09-13 DIAGNOSIS — I10 Essential (primary) hypertension: Secondary | ICD-10-CM

## 2013-09-13 DIAGNOSIS — Z1283 Encounter for screening for malignant neoplasm of skin: Secondary | ICD-10-CM

## 2013-09-13 DIAGNOSIS — R6889 Other general symptoms and signs: Secondary | ICD-10-CM | POA: Insufficient documentation

## 2013-09-13 LAB — CBC
MCH: 29.2 pg (ref 26.0–34.0)
MCHC: 34.3 g/dL (ref 30.0–36.0)
MCV: 85.2 fL (ref 78.0–100.0)
Platelets: 166 10*3/uL (ref 150–400)
RDW: 13.8 % (ref 11.5–15.5)

## 2013-09-13 MED ORDER — BUPROPION HCL ER (SR) 150 MG PO TB12: 150.0000 mg | ORAL_TABLET | Freq: Two times a day (BID) | ORAL | Status: DC

## 2013-09-13 MED ORDER — AZITHROMYCIN 250 MG PO TABS: ORAL_TABLET | ORAL | Status: DC

## 2013-09-13 MED ORDER — ALBUTEROL SULFATE HFA 108 (90 BASE) MCG/ACT IN AERS: 2.0000 | INHALATION_SPRAY | Freq: Four times a day (QID) | RESPIRATORY_TRACT | Status: DC | PRN

## 2013-09-13 NOTE — Progress Notes (Signed)
Patient ID: Steve Beasley, male   DOB: December 28, 1965, 47 y.o.   MRN: [id number] Steve Beasley [id number] Feb 10, 1966 09/13/2013      Progress Note-Follow Up  Subjective  Chief Complaint  Chief Complaint  Patient presents with  . Annual Exam    physical    HPI  Patient is a 47 year old male who is in today for annual exam. He is struggling with respiratory symptoms at the moment. Notes head congestion and chest congestion over the past week. Struggles with a headache for malaise. Cough is overwhelming at times. Is worse with exertion. No other recent illness. Has been exercising regularly and trying to maintain a heart healthy diet. Shows good weight loss since last visit. He takes a Mucinex D to try and deal with her cold symptoms recently without any great improvement. Patient stopped Lipitor due to some right upper quadrant pain. unfortunately pain persisted and ultrasound was negative for gallstones but positive for fatty liver.  Past Medical History  Diagnosis Date  . Diabetes mellitus without complication   . Hypertension   . Osteopenia   . Testicular cancer   . Allergy-induced asthma   . Obesity, unspecified 06/06/2013  . Nicotine use disorder 06/06/2013    smokeless    Past Surgical History  Procedure Laterality Date  . Testicular cancer surgery      Family History  Problem Relation Age of Onset  . Diabetes Mother   . Hyperlipidemia Mother   . Hypertension Father   . Heart attack Neg Hx   . Sudden death Neg Hx   . Stroke Paternal Grandfather   . Prostate cancer Father   . Prostate cancer Maternal Grandfather   . Breast cancer Mother   . Bone cancer Mother     History   Social History  . Marital Status: Married    Spouse Name: N/A    Number of Children: N/A  . Years of Education: N/A   Occupational History  . Not on file.   Social History Main Topics  . Smoking status: Former Games developer  . Smokeless tobacco: Not on file  . Alcohol Use: Yes  . Drug Use:  No  . Sexual Activity: Not on file   Other Topics Concern  . Not on file   Social History Narrative  . No narrative on file    Current Outpatient Prescriptions on File Prior to Visit  Medication Sig Dispense Refill  . ibuprofen (ADVIL,MOTRIN) 200 MG tablet Take 200 mg by mouth every 6 (six) hours as needed for pain.      Marland Kitchen losartan-hydrochlorothiazide (HYZAAR) 100-25 MG per tablet Take 1 tablet by mouth daily.  30 tablet  5  . metFORMIN (GLUCOPHAGE) 1000 MG tablet Take 1 tablet (1,000 mg total) by mouth 2 (two) times daily with a meal.  60 tablet  2  . Probiotic Product (ALIGN) 4 MG CAPS Take 1 tablet by mouth.       No current facility-administered medications on file prior to visit.    No Known Allergies  Review of Systems  Review of Systems  Constitutional: Negative for fever and malaise/fatigue.  HENT: Negative for congestion.   Eyes: Negative for discharge.  Respiratory: Negative for shortness of breath.   Cardiovascular: Negative for chest pain, palpitations and leg swelling.  Gastrointestinal: Negative for nausea, abdominal pain and diarrhea.  Genitourinary: Negative for dysuria.  Musculoskeletal: Negative for falls.  Skin: Negative for rash.  Neurological: Negative for loss of consciousness and headaches.  Endo/Heme/Allergies: Negative  for polydipsia.  Psychiatric/Behavioral: Negative for depression and suicidal ideas. The patient is not nervous/anxious and does not have insomnia.     Objective  BP 158/80  Pulse 97  Temp(Src) 97.5 F (36.4 C) (Oral)  Ht 5\' 11"  (1.803 m)  Wt 221 lb 1.3 oz (100.281 kg)  BMI 30.85 kg/m2  SpO2 97%  Physical Exam  Physical Exam  Constitutional: He is oriented to person, place, and time and well-developed, well-nourished, and in no distress. No distress.  HENT:  Head: Normocephalic and atraumatic.  Eyes: Conjunctivae are normal.  Neck: Neck supple. No thyromegaly present.  Cardiovascular: Normal rate, regular rhythm and  normal heart sounds.   No murmur heard. Pulmonary/Chest: Effort normal and breath sounds normal. No respiratory distress.  Abdominal: He exhibits no distension and no mass. There is no tenderness.  Musculoskeletal: He exhibits no edema.  Neurological: He is alert and oriented to person, place, and time.  Skin: Skin is warm.  Psychiatric: Memory, affect and judgment normal.    Lab Results  Component Value Date   TSH 1.553 06/02/2013   Lab Results  Component Value Date   WBC 8.7 07/12/2013   HGB 14.3 07/12/2013   HCT 40.8 07/12/2013   MCV 82.3 07/12/2013   PLT 145* 07/12/2013   Lab Results  Component Value Date   CREATININE 1.16 07/12/2013   BUN 15 07/12/2013   NA 141 07/12/2013   K 4.0 07/12/2013   CL 100 07/12/2013   CO2 31 07/12/2013   Lab Results  Component Value Date   ALT 47 07/12/2013   AST 24 07/12/2013   ALKPHOS 80 07/12/2013   BILITOT 1.1 07/12/2013   Lab Results  Component Value Date   CHOL 189 06/02/2013   Lab Results  Component Value Date   HDL 36* 06/02/2013   Lab Results  Component Value Date   LDLCALC 95 06/02/2013   Lab Results  Component Value Date   TRIG 288* 06/02/2013   Lab Results  Component Value Date   CHOLHDL 5.3 06/02/2013     Assessment & Plan  Anxiety state, unspecified Started on wellbutrin after discussion.  Preventative health care declines Tdap had flu shot in October. encouraged heart heatlhy diet and regular exercise.   Nicotine use disorder encouraged complete cessaiton, counseled for greater than 3 minutes regarding need for complete cessation  Obesity, unspecified Good weight loss since last visit. Continue exercise. Encouraged heart heathy diet  HTN (hypertension) elevated with acute illness, generally well controlled, enocuraged DASH diet and continue exercise

## 2013-09-13 NOTE — Patient Instructions (Signed)

## 2013-09-13 NOTE — Progress Notes (Signed)
Pre visit review using our clinic review tool, if applicable. No additional management support is needed unless otherwise documented below in the visit note. 

## 2013-09-14 LAB — LIPID PANEL
Cholesterol: 167 mg/dL (ref 0–200)
HDL: 32 mg/dL — ABNORMAL LOW (ref >39)
LDL Cholesterol: 86 mg/dL (ref 0–99)
Total CHOL/HDL Ratio: 5.2 Ratio

## 2013-09-14 LAB — RENAL FUNCTION PANEL
Albumin: 4.4 g/dL (ref 3.5–5.2)
Calcium: 10.6 mg/dL — ABNORMAL HIGH (ref 8.4–10.5)
Creat: 1.17 mg/dL (ref 0.50–1.35)
Glucose, Bld: 94 mg/dL (ref 70–99)
Sodium: 137 mEq/L (ref 135–145)

## 2013-09-14 LAB — HEPATIC FUNCTION PANEL
AST: 24 U/L (ref 0–37)
Albumin: 4.4 g/dL (ref 3.5–5.2)
Total Bilirubin: 1.1 mg/dL (ref 0.3–1.2)
Total Protein: 7.2 g/dL (ref 6.0–8.3)

## 2013-09-14 LAB — TSH: TSH: 2.122 u[IU]/mL (ref 0.350–4.500)

## 2013-09-14 LAB — VITAMIN D 25 HYDROXY (VIT D DEFICIENCY, FRACTURES): Vit D, 25-Hydroxy: 27 ng/mL — ABNORMAL LOW (ref 30–89)

## 2013-09-14 LAB — HEMOGLOBIN A1C
Hgb A1c MFr Bld: 5.8 % — ABNORMAL HIGH (ref <5.7)
Mean Plasma Glucose: 120 mg/dL — ABNORMAL HIGH (ref <117)

## 2013-09-19 ENCOUNTER — Encounter: Payer: Self-pay | Admitting: Family Medicine

## 2013-09-19 DIAGNOSIS — F411 Generalized anxiety disorder: Secondary | ICD-10-CM

## 2013-09-19 DIAGNOSIS — J209 Acute bronchitis, unspecified: Secondary | ICD-10-CM | POA: Insufficient documentation

## 2013-09-19 DIAGNOSIS — K760 Fatty (change of) liver, not elsewhere classified: Secondary | ICD-10-CM

## 2013-09-19 DIAGNOSIS — Z0000 Encounter for general adult medical examination without abnormal findings: Secondary | ICD-10-CM | POA: Insufficient documentation

## 2013-09-19 DIAGNOSIS — F32A Depression, unspecified: Secondary | ICD-10-CM | POA: Insufficient documentation

## 2013-09-19 HISTORY — DX: Fatty (change of) liver, not elsewhere classified: K76.0

## 2013-09-19 HISTORY — DX: Encounter for general adult medical examination without abnormal findings: Z00.00

## 2013-09-19 HISTORY — DX: Generalized anxiety disorder: F41.1

## 2013-09-19 NOTE — Assessment & Plan Note (Signed)
elevated with acute illness, generally well controlled, enocuraged DASH diet and continue exercise

## 2013-09-19 NOTE — Assessment & Plan Note (Signed)
declines Tdap had flu shot in October. encouraged heart heatlhy diet and regular exercise.

## 2013-09-19 NOTE — Assessment & Plan Note (Signed)
encouraged complete cessaiton, counseled for greater than 3 minutes regarding need for complete cessation

## 2013-09-19 NOTE — Assessment & Plan Note (Signed)
Good weight loss since last visit. Continue exercise. Encouraged heart heathy diet

## 2013-09-19 NOTE — Assessment & Plan Note (Signed)
>>  ASSESSMENT AND PLAN FOR HTN (HYPERTENSION) WRITTEN ON 09/19/2013 11:19 AM BY BLYTH, STACEY A, MD  elevated with acute illness, generally well controlled, enocuraged DASH diet and continue exercise

## 2013-09-19 NOTE — Assessment & Plan Note (Signed)
Started on wellbutrin after discussion.

## 2013-09-27 ENCOUNTER — Telehealth: Payer: Self-pay | Admitting: Family Medicine

## 2013-09-27 MED ORDER — CEFDINIR 300 MG PO CAPS: 300.0000 mg | ORAL_CAPSULE | Freq: Two times a day (BID) | ORAL | Status: DC

## 2013-09-27 NOTE — Telephone Encounter (Signed)
Patient informed and RX sent. Pt states he is doing all the recommended things

## 2013-09-27 NOTE — Telephone Encounter (Signed)
Patient was in two weeks ago for cpe and got antibiotic at that time.  Has finished the med and now has congestion in head and chest and now he is coughing up green mucus.  His throat is also sore and has white spots.  Could you call him in something to [facility]

## 2013-09-27 NOTE — Telephone Encounter (Signed)
He can have Cefdinir 300 mg po bid x 7 days if no better needs to come back in, make sure he takes Mucinex and probiotics and increases rest and hydration

## 2013-09-27 NOTE — Telephone Encounter (Signed)
Please advise 

## 2013-12-01 ENCOUNTER — Other Ambulatory Visit: Payer: Self-pay | Admitting: Family Medicine

## 2013-12-19 ENCOUNTER — Other Ambulatory Visit: Payer: Self-pay | Admitting: Family Medicine

## 2014-01-10 ENCOUNTER — Other Ambulatory Visit: Payer: Self-pay | Admitting: Family Medicine

## 2014-01-10 ENCOUNTER — Telehealth: Payer: Self-pay

## 2014-01-10 DIAGNOSIS — E559 Vitamin D deficiency, unspecified: Secondary | ICD-10-CM

## 2014-01-10 DIAGNOSIS — Z0000 Encounter for general adult medical examination without abnormal findings: Secondary | ICD-10-CM

## 2014-01-10 DIAGNOSIS — E119 Type 2 diabetes mellitus without complications: Secondary | ICD-10-CM

## 2014-01-10 DIAGNOSIS — I10 Essential (primary) hypertension: Secondary | ICD-10-CM

## 2014-01-10 DIAGNOSIS — M8580 Other specified disorders of bone density and structure, unspecified site: Secondary | ICD-10-CM

## 2014-01-10 NOTE — Telephone Encounter (Signed)
Lab order placed.

## 2014-01-10 NOTE — Telephone Encounter (Signed)
Pt came to the lab today for labs to be ran? Please advise what labs you would like done and diagnosis?

## 2014-01-10 NOTE — Telephone Encounter (Signed)
Vit d, PTH for hypercalcemia, cbc, tsh, hepatic and renal for diabetes, hyperlipid

## 2014-01-11 LAB — HEPATIC FUNCTION PANEL
ALK PHOS: 93 U/L (ref 39–117)
ALT: 30 U/L (ref 0–53)
AST: 24 U/L (ref 0–37)
Albumin: 4.1 g/dL (ref 3.5–5.2)
BILIRUBIN INDIRECT: 1 mg/dL (ref 0.2–1.2)
Bilirubin, Direct: 0.3 mg/dL (ref 0.0–0.3)
TOTAL PROTEIN: 6.7 g/dL (ref 6.0–8.3)
Total Bilirubin: 1.3 mg/dL — ABNORMAL HIGH (ref 0.2–1.2)

## 2014-01-11 LAB — RENAL FUNCTION PANEL
Albumin: 4.1 g/dL (ref 3.5–5.2)
BUN: 19 mg/dL (ref 6–23)
CO2: 28 mEq/L (ref 19–32)
Calcium: 10.4 mg/dL (ref 8.4–10.5)
Chloride: 104 mEq/L (ref 96–112)
Creat: 1.24 mg/dL (ref 0.50–1.35)
Glucose, Bld: 116 mg/dL — ABNORMAL HIGH (ref 70–99)
PHOSPHORUS: 2.1 mg/dL — AB (ref 2.3–4.6)
Potassium: 4.6 mEq/L (ref 3.5–5.3)
Sodium: 139 mEq/L (ref 135–145)

## 2014-01-11 LAB — CBC
HEMATOCRIT: 42 % (ref 39.0–52.0)
Hemoglobin: 14.9 g/dL (ref 13.0–17.0)
MCH: 29.2 pg (ref 26.0–34.0)
MCHC: 35.5 g/dL (ref 30.0–36.0)
MCV: 82.4 fL (ref 78.0–100.0)
Platelets: 136 10*3/uL — ABNORMAL LOW (ref 150–400)
RBC: 5.1 MIL/uL (ref 4.22–5.81)
RDW: 13.6 % (ref 11.5–15.5)
WBC: 5.7 10*3/uL (ref 4.0–10.5)

## 2014-01-12 ENCOUNTER — Telehealth: Payer: Self-pay | Admitting: *Deleted

## 2014-01-12 DIAGNOSIS — E559 Vitamin D deficiency, unspecified: Secondary | ICD-10-CM

## 2014-01-12 LAB — VITAMIN D 25 HYDROXY (VIT D DEFICIENCY, FRACTURES): Vit D, 25-Hydroxy: 23 ng/mL — ABNORMAL LOW (ref 30–89)

## 2014-01-12 LAB — HEMOGLOBIN A1C
Hgb A1c MFr Bld: 6.1 % — ABNORMAL HIGH (ref <5.7)
Mean Plasma Glucose: 128 mg/dL — ABNORMAL HIGH (ref <117)

## 2014-01-12 LAB — PTH, INTACT AND CALCIUM
Calcium: 10.4 mg/dL (ref 8.4–10.5)
PTH: 51.9 pg/mL (ref 14.0–72.0)

## 2014-01-12 MED ORDER — VITAMIN D (ERGOCALCIFEROL) 1.25 MG (50000 UNIT) PO CAPS: 50000.0000 [IU] | ORAL_CAPSULE | ORAL | Status: DC

## 2014-01-12 NOTE — Telephone Encounter (Signed)
Test has been added per [facility].  Notified pt of result and he voices understanding. Rx sent to [facility] and lab order has been entered.

## 2014-01-12 NOTE — Telephone Encounter (Signed)
Message copied by Ronny Flurry on Wed Jan 12, 2014  2:45 PM ------      Message from: Penni Homans A      Created: Wed Jan 12, 2014  1:14 PM       Please have lab add a [id number] due to hyperglycemia. Also needs a prescription for Vitamin D 50000IU caps, 1 cap po weekly x 12 weeks then recheck ------

## 2014-01-17 ENCOUNTER — Ambulatory Visit (INDEPENDENT_AMBULATORY_CARE_PROVIDER_SITE_OTHER): Payer: BC Managed Care – PPO | Admitting: Family Medicine

## 2014-01-17 ENCOUNTER — Encounter: Payer: Self-pay | Admitting: Family Medicine

## 2014-01-17 VITALS — BP 138/76 | HR 62 | Temp 97.9°F | Ht 71.0 in | Wt 228.1 lb

## 2014-01-17 DIAGNOSIS — E119 Type 2 diabetes mellitus without complications: Secondary | ICD-10-CM

## 2014-01-17 DIAGNOSIS — E785 Hyperlipidemia, unspecified: Secondary | ICD-10-CM

## 2014-01-17 DIAGNOSIS — M899 Disorder of bone, unspecified: Secondary | ICD-10-CM

## 2014-01-17 DIAGNOSIS — K760 Fatty (change of) liver, not elsewhere classified: Secondary | ICD-10-CM

## 2014-01-17 DIAGNOSIS — M949 Disorder of cartilage, unspecified: Secondary | ICD-10-CM

## 2014-01-17 DIAGNOSIS — Z23 Encounter for immunization: Secondary | ICD-10-CM

## 2014-01-17 DIAGNOSIS — M8580 Other specified disorders of bone density and structure, unspecified site: Secondary | ICD-10-CM

## 2014-01-17 DIAGNOSIS — I10 Essential (primary) hypertension: Secondary | ICD-10-CM

## 2014-01-17 DIAGNOSIS — K7689 Other specified diseases of liver: Secondary | ICD-10-CM

## 2014-01-17 NOTE — Progress Notes (Signed)
Patient ID: Steve Beasley, male   DOB: 06/03/1966, 48 y.o.   MRN: [id number] Steve Beasley [id number] Dec 05, 1965 01/17/2014      Progress Note-Follow Up  Subjective  Chief Complaint  Chief Complaint  Patient presents with  . Follow-up    4 month  . Injections    tdap    HPI  Patient is a 48 year old male in today for routine medical care. Well. Notes probiotics and helped his abdominal pain and GI discomfort. He's had no recent illness. No polyuria or polydipsia. Denies CP/palp/SOB/HA/congestion/fevers/GI or GU c/o. Taking meds as prescribed  Past Medical History  Diagnosis Date  . Diabetes mellitus without complication   . Hypertension   . Osteopenia   . Testicular cancer   . Allergy-induced asthma   . Obesity, unspecified 06/06/2013  . Nicotine use disorder 06/06/2013    smokeless  . Preventative health care 09/19/2013  . Anxiety state, unspecified 09/19/2013  . Fatty liver disease, nonalcoholic 57/32/2025    Past Surgical History  Procedure Laterality Date  . Testicular cancer surgery      Family History  Problem Relation Age of Onset  . Diabetes Mother   . Breast cancer Mother   . Bone cancer Mother   . Cancer Mother     breast with bone mets  . Depression Mother   . Hypertension Father   . Prostate cancer Father   . Hyperlipidemia Father   . Cancer Father     prostate  . Heart attack Neg Hx   . Sudden death Neg Hx   . Stroke Paternal Grandfather   . Prostate cancer Maternal Grandfather   . Hypertension Brother   . Gallstones Brother   . Anxiety disorder Daughter   . ADD / ADHD Daughter   . Bulemia Daughter   . Depression Son   . Anxiety disorder Maternal Aunt     social anxiety, anger    History   Social History  . Marital Status: Married    Spouse Name: N/A    Number of Children: N/A  . Years of Education: N/A   Occupational History  . Not on file.   Social History Main Topics  . Smoking status: Former Research scientist (life sciences)  . Smokeless tobacco:  Not on file  . Alcohol Use: Yes  . Drug Use: No  . Sexual Activity: Yes   Other Topics Concern  . Not on file   Social History Narrative  . No narrative on file    Current Outpatient Prescriptions on File Prior to Visit  Medication Sig Dispense Refill  . albuterol (PROVENTIL HFA;VENTOLIN HFA) 108 (90 BASE) MCG/ACT inhaler Inhale 2 puffs into the lungs every 6 (six) hours as needed for wheezing or shortness of breath.  1 Inhaler  2  . atorvastatin (LIPITOR) 10 MG tablet       . losartan-hydrochlorothiazide (HYZAAR) 100-25 MG per tablet TAKE ONE TABLET BY MOUTH ONE TIME DAILY   30 tablet  4  . metFORMIN (GLUCOPHAGE) 1000 MG tablet TAKE ONE TABLET TWICE DAILY WITH MEALS   60 tablet  1  . Probiotic Product (ALIGN) 4 MG CAPS Take 1 tablet by mouth.      . Vitamin D, Ergocalciferol, (DRISDOL) 50000 UNITS CAPS capsule Take 1 capsule (50,000 Units total) by mouth every 7 (seven) days. For 12 weeks then recheck vitamin d level  12 capsule  0  . buPROPion (WELLBUTRIN SR) 150 MG 12 hr tablet Take 1 tablet (150 mg total) by  mouth 2 (two) times daily.  60 tablet  5   No current facility-administered medications on file prior to visit.    No Known Allergies  Review of Systems  Review of Systems  Constitutional: Negative for fever and malaise/fatigue.  HENT: Negative for congestion.   Eyes: Negative for discharge.  Respiratory: Negative for shortness of breath.   Cardiovascular: Negative for chest pain, palpitations and leg swelling.  Gastrointestinal: Negative for nausea, abdominal pain and diarrhea.  Genitourinary: Negative for dysuria.  Musculoskeletal: Negative for falls.  Skin: Negative for rash.  Neurological: Negative for loss of consciousness and headaches.  Endo/Heme/Allergies: Negative for polydipsia.  Psychiatric/Behavioral: Negative for depression and suicidal ideas. The patient is not nervous/anxious and does not have insomnia.     Objective  BP 138/76  Pulse 62   Temp(Src) 97.9 F (36.6 C) (Oral)  Ht 5\' 11"  (1.803 m)  Wt 228 lb 1.3 oz (103.456 kg)  BMI 31.82 kg/m2  SpO2 97%  Physical Exam  Physical Exam  Constitutional: He is oriented to person, place, and time and well-developed, well-nourished, and in no distress. No distress.  HENT:  Head: Normocephalic and atraumatic.  Eyes: Conjunctivae are normal.  Neck: Neck supple. No thyromegaly present.  Cardiovascular: Normal rate, regular rhythm and normal heart sounds.   No murmur heard. Pulmonary/Chest: Effort normal and breath sounds normal. No respiratory distress.  Abdominal: He exhibits no distension and no mass. There is no tenderness.  Musculoskeletal: He exhibits no edema.  Neurological: He is alert and oriented to person, place, and time.  Skin: Skin is warm.  Psychiatric: Memory, affect and judgment normal.    Lab Results  Component Value Date   TSH 2.122 09/13/2013   Lab Results  Component Value Date   WBC 5.7 01/10/2014   HGB 14.9 01/10/2014   HCT 42.0 01/10/2014   MCV 82.4 01/10/2014   PLT 136* 01/10/2014   Lab Results  Component Value Date   CREATININE 1.24 01/10/2014   BUN 19 01/10/2014   NA 139 01/10/2014   K 4.6 01/10/2014   CL 104 01/10/2014   CO2 28 01/10/2014   Lab Results  Component Value Date   ALT 30 01/10/2014   AST 24 01/10/2014   ALKPHOS 93 01/10/2014   BILITOT 1.3* 01/10/2014   Lab Results  Component Value Date   CHOL 167 09/13/2013   Lab Results  Component Value Date   HDL 32* 09/13/2013   Lab Results  Component Value Date   LDLCALC 86 09/13/2013   Lab Results  Component Value Date   TRIG 244* 09/13/2013   Lab Results  Component Value Date   CHOLHDL 5.2 09/13/2013     Assessment & Plan  DM (diabetes mellitus), type 2 hgba1c acceptable, minimize simple carbs. Increase exercise as tolerated. Continue current meds  Fatty liver disease, nonalcoholic Encouraged DASH diet, decrease po intake and increase exercise as tolerated. Needs 7-8 hours  of sleep nightly. Avoid trans fats, eat small, frequent meals every 4-5 hours with lean proteins, complex carbs and healthy fats. Minimize simple carbs, GMO foods.  HTN (hypertension) Well controlled, no changes to meds. Encouraged heart healthy diet such as the DASH diet and exercise as tolerated.   Other and unspecified hyperlipidemia Tolerating statin, encouraged heart healthy diet, avoid trans fats, minimize simple carbs and saturated fats. Increase exercise as tolerated  Osteopenia Encouraged Citracal daily and vitamin D supplements as well as increased exercise

## 2014-01-17 NOTE — Progress Notes (Signed)
Pre visit review using our clinic review tool, if applicable. No additional management support is needed unless otherwise documented below in the visit note. 

## 2014-01-17 NOTE — Addendum Note (Signed)
Addended by: Varney Daily on: 01/17/2014 09:04 AM   Modules accepted: Orders

## 2014-01-17 NOTE — Patient Instructions (Addendum)
Add vitamin d to labs prior to next visit   Hypertension As your heart beats, it forces blood through your arteries. This force is your blood pressure. If the pressure is too high, it is called hypertension (HTN) or high blood pressure. HTN is dangerous because you may have it and not know it. High blood pressure may mean that your heart has to work harder to pump blood. Your arteries may be narrow or stiff. The extra work puts you at risk for heart disease, stroke, and other problems.  Blood pressure consists of two numbers, a higher number over a lower, 110/72, for example. It is stated as "110 over 72." The ideal is below 120 for the top number (systolic) and under 80 for the bottom (diastolic). Write down your blood pressure today. You should pay close attention to your blood pressure if you have certain conditions such as:  Heart failure.  Prior heart attack.  Diabetes  Chronic kidney disease.  Prior stroke.  Multiple risk factors for heart disease. To see if you have HTN, your blood pressure should be measured while you are seated with your arm held at the level of the heart. It should be measured at least twice. A one-time elevated blood pressure reading (especially in the Emergency Department) does not mean that you need treatment. There may be conditions in which the blood pressure is different between your right and left arms. It is important to see your caregiver soon for a recheck. Most people have essential hypertension which means that there is not a specific cause. This type of high blood pressure may be lowered by changing lifestyle factors such as:  Stress.  Smoking.  Lack of exercise.  Excessive weight.  Drug/tobacco/alcohol use.  Eating less salt. Most people do not have symptoms from high blood pressure until it has caused damage to the body. Effective treatment can often prevent, delay or reduce that damage. TREATMENT  When a cause has been identified,  treatment for high blood pressure is directed at the cause. There are a large number of medications to treat HTN. These fall into several categories, and your caregiver will help you select the medicines that are best for you. Medications may have side effects. You should review side effects with your caregiver. If your blood pressure stays high after you have made lifestyle changes or started on medicines,   Your medication(s) may need to be changed.  Other problems may need to be addressed.  Be certain you understand your prescriptions, and know how and when to take your medicine.  Be sure to follow up with your caregiver within the time frame advised (usually within two weeks) to have your blood pressure rechecked and to review your medications.  If you are taking more than one medicine to lower your blood pressure, make sure you know how and at what times they should be taken. Taking two medicines at the same time can result in blood pressure that is too low. SEEK IMMEDIATE MEDICAL CARE IF:  You develop a severe headache, blurred or changing vision, or confusion.  You have unusual weakness or numbness, or a faint feeling.  You have severe chest or abdominal pain, vomiting, or breathing problems. MAKE SURE YOU:   Understand these instructions.  Will watch your condition.  Will get help right away if you are not doing well or get worse. Document Released: 09/16/2005 Document Revised: 12/09/2011 Document Reviewed: 05/06/2008 [facility] Patient Information 2014 [place].

## 2014-01-19 ENCOUNTER — Encounter: Payer: Self-pay | Admitting: Family Medicine

## 2014-01-19 DIAGNOSIS — E785 Hyperlipidemia, unspecified: Secondary | ICD-10-CM

## 2014-01-19 HISTORY — DX: Hyperlipidemia, unspecified: E78.5

## 2014-01-19 NOTE — Assessment & Plan Note (Signed)
Tolerating statin, encouraged heart healthy diet, avoid trans fats, minimize simple carbs and saturated fats. Increase exercise as tolerated 

## 2014-01-19 NOTE — Assessment & Plan Note (Signed)
hgba1c acceptable, minimize simple carbs. Increase exercise as tolerated. Continue current meds 

## 2014-01-19 NOTE — Assessment & Plan Note (Signed)
Encouraged DASH diet, decrease po intake and increase exercise as tolerated. Needs 7-8 hours of sleep nightly. Avoid trans fats, eat small, frequent meals every 4-5 hours with lean proteins, complex carbs and healthy fats. Minimize simple carbs, GMO foods. 

## 2014-01-19 NOTE — Assessment & Plan Note (Signed)
Well controlled, no changes to meds. Encouraged heart healthy diet such as the DASH diet and exercise as tolerated.  °

## 2014-01-19 NOTE — Assessment & Plan Note (Signed)
Encouraged Citracal daily and vitamin D supplements as well as increased exercise

## 2014-01-19 NOTE — Assessment & Plan Note (Signed)
>>  ASSESSMENT AND PLAN FOR HTN (HYPERTENSION) WRITTEN ON 01/19/2014 12:36 PM BY BLYTH, STACEY A, MD  Well controlled, no changes to meds. Encouraged heart healthy diet such as the DASH diet and exercise as tolerated.

## 2014-02-27 ENCOUNTER — Other Ambulatory Visit: Payer: Self-pay | Admitting: Family Medicine

## 2014-02-28 NOTE — Telephone Encounter (Signed)
Please advise refill? Is patient to continue taking the Vitamin D?

## 2014-02-28 NOTE — Telephone Encounter (Signed)
Yes but we gave him 12 caps in mid April he should have enough til we recheck his labs in July?

## 2014-03-25 ENCOUNTER — Other Ambulatory Visit: Payer: Self-pay | Admitting: Family Medicine

## 2014-04-20 ENCOUNTER — Telehealth: Payer: Self-pay

## 2014-04-20 DIAGNOSIS — E785 Hyperlipidemia, unspecified: Secondary | ICD-10-CM

## 2014-04-20 DIAGNOSIS — E119 Type 2 diabetes mellitus without complications: Secondary | ICD-10-CM

## 2014-04-20 DIAGNOSIS — I10 Essential (primary) hypertension: Secondary | ICD-10-CM

## 2014-04-20 NOTE — Telephone Encounter (Signed)
Return in about 3 months (around 04/18/2014) for follow up DM, HTN, hi chol, labs prior, lipid, renal, cbc, tsh, hepatic, hgba1c prior.     Pt will return to lab on 04/21/2014 orders entered

## 2014-04-21 ENCOUNTER — Ambulatory Visit (INDEPENDENT_AMBULATORY_CARE_PROVIDER_SITE_OTHER): Payer: BC Managed Care – PPO | Admitting: Family Medicine

## 2014-04-21 ENCOUNTER — Encounter: Payer: Self-pay | Admitting: Family Medicine

## 2014-04-21 VITALS — BP 144/82 | HR 66 | Temp 98.7°F | Ht 71.0 in | Wt 229.8 lb

## 2014-04-21 DIAGNOSIS — E669 Obesity, unspecified: Secondary | ICD-10-CM

## 2014-04-21 DIAGNOSIS — M899 Disorder of bone, unspecified: Secondary | ICD-10-CM

## 2014-04-21 DIAGNOSIS — M949 Disorder of cartilage, unspecified: Secondary | ICD-10-CM

## 2014-04-21 DIAGNOSIS — I10 Essential (primary) hypertension: Secondary | ICD-10-CM

## 2014-04-21 DIAGNOSIS — E119 Type 2 diabetes mellitus without complications: Secondary | ICD-10-CM

## 2014-04-21 DIAGNOSIS — M8580 Other specified disorders of bone density and structure, unspecified site: Secondary | ICD-10-CM

## 2014-04-21 DIAGNOSIS — E559 Vitamin D deficiency, unspecified: Secondary | ICD-10-CM

## 2014-04-21 DIAGNOSIS — F17200 Nicotine dependence, unspecified, uncomplicated: Secondary | ICD-10-CM

## 2014-04-21 NOTE — Progress Notes (Signed)
Pre visit review using our clinic review tool, if applicable. No additional management support is needed unless otherwise documented below in the visit note. 

## 2014-04-21 NOTE — Patient Instructions (Signed)
Vitamin d level with next blood draw   Basic Carbohydrate Counting for Diabetes Mellitus Carbohydrate counting is a method for keeping track of the amount of carbohydrates you eat. Eating carbohydrates naturally increases the level of sugar (glucose) in your blood, so it is important for you to know the amount that is okay for you to have in every meal. Carbohydrate counting helps keep the level of glucose in your blood within normal limits. The amount of carbohydrates allowed is different for every person. A dietitian can help you calculate the amount that is right for you. Once you know the amount of carbohydrates you can have, you can count the carbohydrates in the foods you want to eat. Carbohydrates are found in the following foods:  Grains, such as breads and cereals.  Dried beans and soy products.  Starchy vegetables, such as potatoes, peas, and corn.  Fruit and fruit juices.  Milk and yogurt.  Sweets and snack foods, such as cake, cookies, candy, chips, soft drinks, and fruit drinks. CARBOHYDRATE COUNTING There are two ways to count the carbohydrates in your food. You can use either of the methods or a combination of both. Reading the "Nutrition Facts" on [place] The "Nutrition Facts" is an area that is included on the labels of almost all packaged food and beverages in the [place]. It includes the serving size of that food or beverage and information about the nutrients in each serving of the food, including the grams (g) of carbohydrate per serving.  Decide the number of servings of this food or beverage that you will be able to eat or drink. Multiply that number of servings by the number of grams of carbohydrate that is listed on the label for that serving. The total will be the amount of carbohydrates you will be having when you eat or drink this food or beverage. Learning Standard Serving Sizes of Food When you eat food that is not packaged or does not include  "Nutrition Facts" on the label, you need to measure the servings in order to count the amount of carbohydrates.A serving of most carbohydrate-rich foods contains about 15 g of carbohydrates. The following list includes serving sizes of carbohydrate-rich foods that provide 15 g ofcarbohydrate per serving:   1 slice of bread (1 oz) or 1 six-inch tortilla.    of a hamburger bun or English muffin.  4-6 crackers.   cup unsweetened dry cereal.    cup hot cereal.   cup rice or pasta.    cup mashed potatoes or  of a large baked potato.  1 cup fresh fruit or one small piece of fruit.    cup canned or frozen fruit or fruit juice.  1 cup milk.   cup plain fat-free yogurt or yogurt sweetened with artificial sweeteners.   cup cooked dried beans or starchy vegetable, such as peas, corn, or potatoes.  Decide the number of standard-size servings that you will eat. Multiply that number of servings by 15 (the grams of carbohydrates in that serving). For example, if you eat 2 cups of strawberries, you will have eaten 2 servings and 30 g of carbohydrates (2 servings x 15 g = 30 g). For foods such as soups and casseroles, in which more than one food is mixed in, you will need to count the carbohydrates in each food that is included. EXAMPLE OF CARBOHYDRATE COUNTING Sample Dinner  3 oz chicken breast.   cup of brown rice.   cup of corn.  1 cup milk.   1 cup strawberries with sugar-free whipped topping.  Carbohydrate Calculation Step 1: Identify the foods that contain carbohydrates:   Rice.   Corn.   Milk.   Strawberries. Step 2:Calculate the number of servings eaten of each:   2 servings of rice.   1 serving of corn.   1 serving of milk.   1 serving of strawberries. Step 3: Multiply each of those number of servings by 15 g:   2 servings of rice x 15 g = 30 g.   1 serving of corn x 15 g = 15 g.   1 serving of milk x 15 g = 15 g.   1 serving of  strawberries x 15 g = 15 g. Step 4: Add together all of the amounts to find the total grams of carbohydrates eaten: 30 g + 15 g + 15 g + 15 g = 75 g. Document Released: 09/16/2005 Document Revised: 01/31/2014 Document Reviewed: 08/13/2013 [facility] Patient Information 2015 [place], [place]. This information is not intended to replace advice given to you by your health care provider. Make sure you discuss any questions you have with your health care provider.

## 2014-04-21 NOTE — Assessment & Plan Note (Addendum)
Increased mid back pain, will follow up with repeat Dexa scan and maintain vit d and calcium

## 2014-04-22 NOTE — Assessment & Plan Note (Signed)
hgba1c acceptable, minimize simple carbs. Increase exercise as tolerated. Continue current meds. Agrees to repeat labs soon

## 2014-04-22 NOTE — Progress Notes (Signed)
Patient ID: Steve Beasley, male   DOB: July 24, 1966, 48 y.o.   MRN: [id number] KEVAL NAM [id number] 1966-05-04 04/22/2014      Progress Note-Follow Up  Subjective  Chief Complaint  No chief complaint on file.   HPI  Patient is a 48 year old male in today for routine medical care. He is in today for followup. He forgot to go to his labs prior to visit. He notes increased stress with a recent job change but feels she's tolerating it fairly well. Has stopped and he chewed tobacco and is just using nicotine gum. No recent illness. Denies any fevers, chills or new concerns. Denies CP/palp/SOB/HA/congestion/fevers/GI or GU c/o. Taking meds as prescribed  Past Medical History  Diagnosis Date  . Diabetes mellitus without complication   . Hypertension   . Osteopenia   . Testicular cancer   . Allergy-induced asthma   . Obesity, unspecified 06/06/2013  . Nicotine use disorder 06/06/2013    smokeless  . Preventative health care 09/19/2013  . Anxiety state, unspecified 09/19/2013  . Fatty liver disease, nonalcoholic 50/35/4656  . Other and unspecified hyperlipidemia 01/19/2014    Past Surgical History  Procedure Laterality Date  . Testicular cancer surgery      Family History  Problem Relation Age of Onset  . Diabetes Mother   . Breast cancer Mother   . Bone cancer Mother   . Cancer Mother     breast with bone mets  . Depression Mother   . Hypertension Father   . Prostate cancer Father   . Hyperlipidemia Father   . Cancer Father     prostate  . Heart attack Neg Hx   . Sudden death Neg Hx   . Stroke Paternal Grandfather   . Prostate cancer Maternal Grandfather   . Hypertension Brother   . Gallstones Brother   . Anxiety disorder Daughter   . ADD / ADHD Daughter   . Bulemia Daughter   . Depression Son   . Anxiety disorder Maternal Aunt     social anxiety, anger    History   Social History  . Marital Status: Married    Spouse Name: N/A    Number of Children: N/A   . Years of Education: N/A   Occupational History  . Not on file.   Social History Main Topics  . Smoking status: Former Research scientist (life sciences)  . Smokeless tobacco: Not on file  . Alcohol Use: Yes  . Drug Use: No  . Sexual Activity: Yes   Other Topics Concern  . Not on file   Social History Narrative  . No narrative on file    Current Outpatient Prescriptions on File Prior to Visit  Medication Sig Dispense Refill  . albuterol (PROVENTIL HFA;VENTOLIN HFA) 108 (90 BASE) MCG/ACT inhaler Inhale 2 puffs into the lungs every 6 (six) hours as needed for wheezing or shortness of breath.  1 Inhaler  2  . atorvastatin (LIPITOR) 10 MG tablet TAKE ONE TABLET BY MOUTH NIGHTLY AT BEDTIME   30 tablet  2  . buPROPion (WELLBUTRIN SR) 150 MG 12 hr tablet Take 1 tablet (150 mg total) by mouth 2 (two) times daily.  60 tablet  5  . losartan-hydrochlorothiazide (HYZAAR) 100-25 MG per tablet TAKE ONE TABLET BY MOUTH ONE TIME DAILY   30 tablet  4  . metFORMIN (GLUCOPHAGE) 1000 MG tablet TAKE ONE TABLET TWICE DAILY WITH MEALS   60 tablet  3  . Probiotic Product (ALIGN) 4 MG CAPS Take  1 tablet by mouth.      . Vitamin D, Ergocalciferol, (DRISDOL) 50000 UNITS CAPS capsule Take 1 capsule (50,000 Units total) by mouth every 7 (seven) days. For 12 weeks then recheck vitamin d level  12 capsule  0   No current facility-administered medications on file prior to visit.    No Known Allergies  Review of Systems  Review of Systems  Constitutional: Negative for fever and malaise/fatigue.  HENT: Negative for congestion.   Eyes: Negative for discharge.  Respiratory: Negative for shortness of breath.   Cardiovascular: Negative for chest pain, palpitations and leg swelling.  Gastrointestinal: Negative for nausea, abdominal pain and diarrhea.  Genitourinary: Negative for dysuria.  Musculoskeletal: Negative for falls.  Skin: Negative for rash.  Neurological: Negative for loss of consciousness and headaches.   Endo/Heme/Allergies: Negative for polydipsia.  Psychiatric/Behavioral: Negative for depression and suicidal ideas. The patient is nervous/anxious. The patient does not have insomnia.     Objective  BP 144/82  Pulse 66  Temp(Src) 98.7 F (37.1 C) (Oral)  Ht 5\' 11"  (1.803 m)  Wt 229 lb 12.8 oz (104.237 kg)  BMI 32.06 kg/m2  SpO2 98%  Physical Exam  Physical Exam  Constitutional: He is oriented to person, place, and time and well-developed, well-nourished, and in no distress. No distress.  HENT:  Head: Normocephalic and atraumatic.  Eyes: Conjunctivae are normal.  Neck: Neck supple. No thyromegaly present.  Cardiovascular: Normal rate, regular rhythm and normal heart sounds.   No murmur heard. Pulmonary/Chest: Effort normal and breath sounds normal. No respiratory distress.  Abdominal: He exhibits no distension and no mass. There is no tenderness.  Musculoskeletal: He exhibits no edema.  Neurological: He is alert and oriented to person, place, and time.  Skin: Skin is warm.  Psychiatric: Memory, affect and judgment normal.    Lab Results  Component Value Date   TSH 2.122 09/13/2013   Lab Results  Component Value Date   WBC 5.7 01/10/2014   HGB 14.9 01/10/2014   HCT 42.0 01/10/2014   MCV 82.4 01/10/2014   PLT 136* 01/10/2014   Lab Results  Component Value Date   CREATININE 1.24 01/10/2014   BUN 19 01/10/2014   NA 139 01/10/2014   K 4.6 01/10/2014   CL 104 01/10/2014   CO2 28 01/10/2014   Lab Results  Component Value Date   ALT 30 01/10/2014   AST 24 01/10/2014   ALKPHOS 93 01/10/2014   BILITOT 1.3* 01/10/2014   Lab Results  Component Value Date   CHOL 167 09/13/2013   Lab Results  Component Value Date   HDL 32* 09/13/2013   Lab Results  Component Value Date   LDLCALC 86 09/13/2013   Lab Results  Component Value Date   TRIG 244* 09/13/2013   Lab Results  Component Value Date   CHOLHDL 5.2 09/13/2013     Assessment & Plan  Osteopenia Increased mid  back pain, will follow up with repeat Dexa scan and maintain vit d and calcium  Obesity, unspecified Encouraged DASH diet, decrease po intake and increase exercise as tolerated. Needs 7-8 hours of sleep nightly. Avoid trans fats, eat small, frequent meals every 4-5 hours with lean proteins, complex carbs and healthy fats. Minimize simple carbs, GMO foods.  DM (diabetes mellitus), type 2 hgba1c acceptable, minimize simple carbs. Increase exercise as tolerated. Continue current meds. Agrees to repeat labs soon  HTN (hypertension) Well controlled, no changes to meds. Encouraged heart healthy diet such as the DASH  diet and exercise as tolerated. Minimize sodium, nicotine and caffeine  Nicotine use disorder Is no longer chewing, only using nicotine gum sparingly. Will attempt to stop altogether.   Vitamin D deficiency Will check level with new labs, is taking 50000IU weekly

## 2014-04-22 NOTE — Assessment & Plan Note (Signed)
>>  ASSESSMENT AND PLAN FOR HTN (HYPERTENSION) WRITTEN ON 04/22/2014 12:57 PM BY BLYTH, STACEY A, MD  Well controlled, no changes to meds. Encouraged heart healthy diet such as the DASH diet and exercise as tolerated. Minimize sodium, nicotine and caffeine

## 2014-04-22 NOTE — Assessment & Plan Note (Signed)
Will check level with new labs, is taking 50000IU weekly

## 2014-04-22 NOTE — Assessment & Plan Note (Signed)
Well controlled, no changes to meds. Encouraged heart healthy diet such as the DASH diet and exercise as tolerated. Minimize sodium, nicotine and caffeine

## 2014-04-22 NOTE — Assessment & Plan Note (Signed)
Is no longer chewing, only using nicotine gum sparingly. Will attempt to stop altogether.

## 2014-04-22 NOTE — Assessment & Plan Note (Signed)
Encouraged DASH diet, decrease po intake and increase exercise as tolerated. Needs 7-8 hours of sleep nightly. Avoid trans fats, eat small, frequent meals every 4-5 hours with lean proteins, complex carbs and healthy fats. Minimize simple carbs, GMO foods. 

## 2014-04-27 ENCOUNTER — Ambulatory Visit (INDEPENDENT_AMBULATORY_CARE_PROVIDER_SITE_OTHER)
Admission: RE | Admit: 2014-04-27 | Discharge: 2014-04-27 | Disposition: A | Discharge status: Home / Self Care | Payer: BC Managed Care – PPO | Source: Ambulatory Visit | Attending: Family Medicine | Admitting: Family Medicine

## 2014-04-27 DIAGNOSIS — M949 Disorder of cartilage, unspecified: Secondary | ICD-10-CM

## 2014-04-27 DIAGNOSIS — M899 Disorder of bone, unspecified: Secondary | ICD-10-CM

## 2014-04-27 DIAGNOSIS — M8580 Other specified disorders of bone density and structure, unspecified site: Secondary | ICD-10-CM

## 2014-05-23 ENCOUNTER — Other Ambulatory Visit: Payer: Self-pay | Admitting: Family Medicine

## 2014-05-23 NOTE — Telephone Encounter (Signed)
Rx sent to pharmacy. Pt is due for f/u 10/23. Please call to schedule. LDM

## 2014-05-24 NOTE — Telephone Encounter (Signed)
Left message for patient to return my call.

## 2014-05-25 NOTE — Telephone Encounter (Signed)
Left message for patient to return my call.

## 2014-06-13 ENCOUNTER — Telehealth: Payer: Self-pay | Admitting: *Deleted

## 2014-06-13 NOTE — Telephone Encounter (Signed)
VM left for pt in regards to DB f/u for a BP check  

## 2014-07-06 ENCOUNTER — Other Ambulatory Visit: Payer: Self-pay | Admitting: Family Medicine

## 2014-07-06 ENCOUNTER — Telehealth: Payer: Self-pay | Admitting: *Deleted

## 2014-07-06 ENCOUNTER — Telehealth: Payer: Self-pay | Admitting: Lab

## 2014-07-06 ENCOUNTER — Other Ambulatory Visit (INDEPENDENT_AMBULATORY_CARE_PROVIDER_SITE_OTHER): Payer: BC Managed Care – PPO

## 2014-07-06 DIAGNOSIS — Z79899 Other long term (current) drug therapy: Secondary | ICD-10-CM

## 2014-07-06 DIAGNOSIS — E119 Type 2 diabetes mellitus without complications: Secondary | ICD-10-CM

## 2014-07-06 DIAGNOSIS — I10 Essential (primary) hypertension: Secondary | ICD-10-CM

## 2014-07-06 DIAGNOSIS — E785 Hyperlipidemia, unspecified: Secondary | ICD-10-CM

## 2014-07-06 DIAGNOSIS — E1169 Type 2 diabetes mellitus with other specified complication: Secondary | ICD-10-CM

## 2014-07-06 DIAGNOSIS — K760 Fatty (change of) liver, not elsewhere classified: Secondary | ICD-10-CM

## 2014-07-06 DIAGNOSIS — E669 Obesity, unspecified: Secondary | ICD-10-CM

## 2014-07-06 DIAGNOSIS — E559 Vitamin D deficiency, unspecified: Secondary | ICD-10-CM

## 2014-07-06 NOTE — Telephone Encounter (Signed)
Dr. Charlett Blake   This patient came in to have blood work done, could you please put the labs in with the diagnosis code. Please order lab as future if your put them in as a telephone note. We are not able to release labs if they are not ordered as future.   Thank you   S.T

## 2014-07-06 NOTE — Telephone Encounter (Signed)
So do I understand correctly that if he takes his meds he feels better and his bp is better controlled? His number today is OK so no changes and if he needs refill give it.

## 2014-07-06 NOTE — Telephone Encounter (Signed)
Rx request to pharmacy/[facility]  

## 2014-07-06 NOTE — Telephone Encounter (Signed)
Called patient to inform that he is due for 3-Mth F/U on 10.23.15 and that we would need to do labs within a week of his appointment; pt stated that [facility] did draw blood this morning, but that it was from his 07.23.15 appt, as he never came back in for the labs; apologized for the misunderstanding and informed pt that I would place the provider requested labs from 07.23.15 OV. Scheduled pt's follow-up appt for Fri, 10.23.15 at 11:00am and informed him that he would receive call when results have been received/SLS  Follow-up Instructions  Return in about 3 months (around 07/22/2014) for lipid, renal, cbc, tsh, hepatic, hgba1c prior.  Lab Orders placed, forwarded information to Angie in lab/SLS

## 2014-07-06 NOTE — Telephone Encounter (Signed)
Pt came in for BP check.  (BP:138/78,P:68 w/meds).  Pt stated the his BP readings was:(this am:184/87 w/o meds),last Friday:191/84 w/o meds,and last Sat:132/81 w/meds.  Pt stated that when he went w/o his meds he had a headache and felt bad.  Please advise.//AB/CMA

## 2014-07-07 ENCOUNTER — Telehealth: Payer: Self-pay | Admitting: Family Medicine

## 2014-07-07 LAB — CBC WITH DIFFERENTIAL/PLATELET
Basophils Absolute: 0 10*3/uL (ref 0.0–0.1)
Basophils Relative: 0.3 % (ref 0.0–3.0)
Eosinophils Absolute: 0.1 10*3/uL (ref 0.0–0.7)
Eosinophils Relative: 1.4 % (ref 0.0–5.0)
HCT: 44.1 % (ref 39.0–52.0)
HEMOGLOBIN: 14.8 g/dL (ref 13.0–17.0)
LYMPHS PCT: 27.7 % (ref 12.0–46.0)
Lymphs Abs: 2.6 10*3/uL (ref 0.7–4.0)
MCHC: 33.6 g/dL (ref 30.0–36.0)
MCV: 86.4 fl (ref 78.0–100.0)
MONOS PCT: 4.7 % (ref 3.0–12.0)
Monocytes Absolute: 0.4 10*3/uL (ref 0.1–1.0)
NEUTROS ABS: 6.2 10*3/uL (ref 1.4–7.7)
Neutrophils Relative %: 65.9 % (ref 43.0–77.0)
Platelets: 157 10*3/uL (ref 150.0–400.0)
RBC: 5.11 Mil/uL (ref 4.22–5.81)
RDW: 12.9 % (ref 11.5–15.5)
WBC: 9.4 10*3/uL (ref 4.0–10.5)

## 2014-07-07 LAB — BASIC METABOLIC PANEL
BUN: 18 mg/dL (ref 6–23)
CO2: 30 mEq/L (ref 19–32)
Calcium: 10.3 mg/dL (ref 8.4–10.5)
Chloride: 102 mEq/L (ref 96–112)
Creatinine, Ser: 1.3 mg/dL (ref 0.4–1.5)
GFR: 63.42 mL/min (ref >60.00)
GLUCOSE: 153 mg/dL — AB (ref 70–99)
POTASSIUM: 4.1 meq/L (ref 3.5–5.1)
SODIUM: 138 meq/L (ref 135–145)

## 2014-07-07 LAB — LIPID PANEL
CHOLESTEROL: 127 mg/dL (ref 0–200)
HDL: 29.6 mg/dL — AB (ref >39.00)
LDL Cholesterol: 60 mg/dL (ref 0–99)
NonHDL: 97.4
Total CHOL/HDL Ratio: 4
Triglycerides: 186 mg/dL — ABNORMAL HIGH (ref 0.0–149.0)
VLDL: 37.2 mg/dL (ref 0.0–40.0)

## 2014-07-07 LAB — HEPATIC FUNCTION PANEL
ALBUMIN: 3.6 g/dL (ref 3.5–5.2)
ALK PHOS: 81 U/L (ref 39–117)
ALT: 62 U/L — ABNORMAL HIGH (ref 0–53)
AST: 34 U/L (ref 0–37)
Bilirubin, Direct: 0.1 mg/dL (ref 0.0–0.3)
TOTAL PROTEIN: 7.3 g/dL (ref 6.0–8.3)
Total Bilirubin: 1.3 mg/dL — ABNORMAL HIGH (ref 0.2–1.2)

## 2014-07-07 LAB — TSH: TSH: 1.92 u[IU]/mL (ref 0.35–4.50)

## 2014-07-07 LAB — HEMOGLOBIN A1C: Hgb A1c MFr Bld: 6.6 % — ABNORMAL HIGH (ref 4.6–6.5)

## 2014-07-07 NOTE — Telephone Encounter (Signed)
Spoke with the pt and informed him of the note and recommendation below.   Pt understood and agreed.  Pt stated that's correct he feels better and bp is controlled when he takes his meds.  Pt said he will not need any refills he has enough.  Pt stated that he has an appt with Dr. Charlett Blake on (07/22/14) in case she wants to change he meds then.//AB/CMA

## 2014-07-07 NOTE — Telephone Encounter (Signed)
emmi emailed °

## 2014-07-07 NOTE — Telephone Encounter (Signed)
LMOM @ (7:59am) asking the pt to RTC regarding note below.//AB/CMA

## 2014-07-14 ENCOUNTER — Other Ambulatory Visit: Payer: Self-pay | Admitting: Family Medicine

## 2014-07-22 ENCOUNTER — Ambulatory Visit (INDEPENDENT_AMBULATORY_CARE_PROVIDER_SITE_OTHER): Payer: BC Managed Care – PPO | Admitting: Family Medicine

## 2014-07-22 ENCOUNTER — Encounter: Payer: Self-pay | Admitting: Family Medicine

## 2014-07-22 VITALS — BP 133/74 | HR 66 | Temp 98.1°F | Ht 71.0 in | Wt 231.6 lb

## 2014-07-22 DIAGNOSIS — E785 Hyperlipidemia, unspecified: Secondary | ICD-10-CM

## 2014-07-22 DIAGNOSIS — I10 Essential (primary) hypertension: Secondary | ICD-10-CM

## 2014-07-22 DIAGNOSIS — E559 Vitamin D deficiency, unspecified: Secondary | ICD-10-CM

## 2014-07-22 DIAGNOSIS — L989 Disorder of the skin and subcutaneous tissue, unspecified: Secondary | ICD-10-CM

## 2014-07-22 DIAGNOSIS — R109 Unspecified abdominal pain: Secondary | ICD-10-CM

## 2014-07-22 DIAGNOSIS — R0600 Dyspnea, unspecified: Secondary | ICD-10-CM

## 2014-07-22 DIAGNOSIS — L578 Other skin changes due to chronic exposure to nonionizing radiation: Secondary | ICD-10-CM

## 2014-07-22 DIAGNOSIS — R350 Frequency of micturition: Secondary | ICD-10-CM

## 2014-07-22 DIAGNOSIS — E118 Type 2 diabetes mellitus with unspecified complications: Secondary | ICD-10-CM

## 2014-07-22 LAB — URINALYSIS
BILIRUBIN URINE: NEGATIVE
Hgb urine dipstick: NEGATIVE
Ketones, ur: NEGATIVE
Leukocytes, UA: NEGATIVE
Nitrite: NEGATIVE
Specific Gravity, Urine: 1.015 (ref 1.000–1.030)
TOTAL PROTEIN, URINE-UPE24: NEGATIVE
URINE GLUCOSE: NEGATIVE
Urobilinogen, UA: 0.2 (ref 0.0–1.0)
pH: 6.5 (ref 5.0–8.0)

## 2014-07-22 NOTE — Progress Notes (Signed)
Pre visit review using our clinic review tool, if applicable. No additional management support is needed unless otherwise documented below in the visit note. 

## 2014-07-22 NOTE — Patient Instructions (Signed)

## 2014-07-22 NOTE — Progress Notes (Signed)
Patient ID: Steve Beasley, male   DOB: Nov 04, 1965, 48 y.o.   MRN: [id number] Steve Beasley [id number] 03/02/1966 07/22/2014      Progress Note-Follow Up  Subjective  Chief Complaint  Chief Complaint  Patient presents with  . Follow-up    3 month    HPI  Patient is a 48 year old male in today for routine medical care. He is in today for follow up. Is generally doing well. Is traveling out of the country this week. No polyuria or polydipsia, he reports he ran out of bp meds this week and his systolic bp climbed to 973 to 190; now 120s since restarting. Notes some urinary frequency but no dysuria. Denies CP/palp/SOB/HA/congestion/fevers/GI c/o. Taking meds as prescribed  Past Medical History  Diagnosis Date  . Diabetes mellitus without complication   . Hypertension   . Osteopenia   . Testicular cancer   . Allergy-induced asthma   . Obesity, unspecified 06/06/2013  . Nicotine use disorder 06/06/2013    smokeless  . Preventative health care 09/19/2013  . Anxiety state, unspecified 09/19/2013  . Fatty liver disease, nonalcoholic 53/29/9242  . Other and unspecified hyperlipidemia 01/19/2014    Past Surgical History  Procedure Laterality Date  . Testicular cancer surgery      Family History  Problem Relation Age of Onset  . Diabetes Mother   . Breast cancer Mother   . Bone cancer Mother   . Cancer Mother     breast with bone mets  . Depression Mother   . Hypertension Father   . Prostate cancer Father   . Hyperlipidemia Father   . Cancer Father     prostate  . Heart attack Neg Hx   . Sudden death Neg Hx   . Stroke Paternal Grandfather   . Prostate cancer Maternal Grandfather   . Hypertension Brother   . Gallstones Brother   . Anxiety disorder Daughter   . ADD / ADHD Daughter   . Bulemia Daughter   . Depression Son   . Anxiety disorder Maternal Aunt     social anxiety, anger    History   Social History  . Marital Status: Married    Spouse Name: N/A     Number of Children: N/A  . Years of Education: N/A   Occupational History  . Not on file.   Social History Main Topics  . Smoking status: Former Research scientist (life sciences)  . Smokeless tobacco: Not on file  . Alcohol Use: Yes  . Drug Use: No  . Sexual Activity: Yes   Other Topics Concern  . Not on file   Social History Narrative  . No narrative on file    Current Outpatient Prescriptions on File Prior to Visit  Medication Sig Dispense Refill  . atorvastatin (LIPITOR) 10 MG tablet TAKE ONE TABLET BY MOUTH NIGHTLY AT BEDTIME   30 tablet  1  . buPROPion (WELLBUTRIN SR) 150 MG 12 hr tablet Take 1 tablet (150 mg total) by mouth 2 (two) times daily.  60 tablet  5  . losartan-hydrochlorothiazide (HYZAAR) 100-25 MG per tablet TAKE ONE TABLET BY MOUTH ONE TIME DAILY   30 tablet  0  . metFORMIN (GLUCOPHAGE) 1000 MG tablet TAKE ONE TABLET TWICE DAILY WITH MEALS   60 tablet  2  . Probiotic Product (ALIGN) 4 MG CAPS Take 1 tablet by mouth.      Marland Kitchen albuterol (PROVENTIL HFA;VENTOLIN HFA) 108 (90 BASE) MCG/ACT inhaler Inhale 2 puffs into the lungs every 6 (  six) hours as needed for wheezing or shortness of breath.  1 Inhaler  2   No current facility-administered medications on file prior to visit.    No Known Allergies  Review of Systems  Review of Systems  Constitutional: Negative for fever and malaise/fatigue.  HENT: Negative for congestion.   Eyes: Negative for discharge.  Respiratory: Negative for shortness of breath.   Cardiovascular: Negative for chest pain, palpitations and leg swelling.  Gastrointestinal: Negative for nausea, abdominal pain and diarrhea.  Genitourinary: Negative for dysuria.  Musculoskeletal: Negative for falls.  Skin: Negative for rash.  Neurological: Negative for loss of consciousness and headaches.  Endo/Heme/Allergies: Negative for polydipsia.  Psychiatric/Behavioral: Negative for depression and suicidal ideas. The patient is not nervous/anxious and does not have insomnia.      Objective  BP 133/74  Pulse 66  Temp(Src) 98.1 F (36.7 C) (Oral)  Ht 5\' 11"  (1.803 m)  Wt 231 lb 9.6 oz (105.053 kg)  BMI 32.32 kg/m2  SpO2 100%  Physical Exam  Physical Exam  Constitutional: He is oriented to person, place, and time and well-developed, well-nourished, and in no distress. No distress.  HENT:  Head: Normocephalic and atraumatic.  Eyes: Conjunctivae are normal.  Neck: Neck supple. No thyromegaly present.  Cardiovascular: Normal rate, regular rhythm and normal heart sounds.   No murmur heard. Pulmonary/Chest: Effort normal and breath sounds normal. No respiratory distress.  Abdominal: He exhibits no distension and no mass. There is no tenderness.  Musculoskeletal: He exhibits no edema.  Neurological: He is alert and oriented to person, place, and time.  Skin: Skin is warm.  Psychiatric: Memory, affect and judgment normal.    Lab Results  Component Value Date   TSH 1.92 07/06/2014   Lab Results  Component Value Date   WBC 9.4 07/06/2014   HGB 14.8 07/06/2014   HCT 44.1 07/06/2014   MCV 86.4 07/06/2014   PLT 157.0 07/06/2014   Lab Results  Component Value Date   CREATININE 1.3 07/06/2014   BUN 18 07/06/2014   NA 138 07/06/2014   K 4.1 07/06/2014   CL 102 07/06/2014   CO2 30 07/06/2014   Lab Results  Component Value Date   ALT 62* 07/06/2014   AST 34 07/06/2014   ALKPHOS 81 07/06/2014   BILITOT 1.3* 07/06/2014   Lab Results  Component Value Date   CHOL 127 07/06/2014   Lab Results  Component Value Date   HDL 29.60* 07/06/2014   Lab Results  Component Value Date   LDLCALC 60 07/06/2014   Lab Results  Component Value Date   TRIG 186.0* 07/06/2014   Lab Results  Component Value Date   CHOLHDL 4 07/06/2014     Assessment & Plan   HTN (hypertension) Well controlled, no changes to meds. Encouraged heart healthy diet such as the DASH diet and exercise as tolerated.   Hyperlipidemia Tolerating statin, encouraged heart healthy diet, avoid  trans fats, minimize simple carbs and saturated fats. Increase exercise as tolerated  DM (diabetes mellitus), type 2 hgba1c acceptable, minimize simple carbs. Increase exercise as tolerated. Continue current meds  Vitamin D deficiency Continue vitamin d supplements at 5000 IU daily and recheck with next visit.  Dyspnea Is traveling this week if symptoms worsen will seek care, will proceed with Echo when he returns and consider cardiology referral as indicated.  Skin lesion of face Referral to dermatology for consideration  Urinary frequency Urine culture negative

## 2014-07-23 LAB — URINE CULTURE: Colony Count: 8000

## 2014-07-25 DIAGNOSIS — R0600 Dyspnea, unspecified: Secondary | ICD-10-CM | POA: Insufficient documentation

## 2014-07-25 DIAGNOSIS — R350 Frequency of micturition: Secondary | ICD-10-CM | POA: Insufficient documentation

## 2014-07-25 DIAGNOSIS — L989 Disorder of the skin and subcutaneous tissue, unspecified: Secondary | ICD-10-CM | POA: Insufficient documentation

## 2014-07-25 NOTE — Assessment & Plan Note (Signed)
Referral to dermatology for consideration

## 2014-07-25 NOTE — Assessment & Plan Note (Signed)
>>  ASSESSMENT AND PLAN FOR HTN (HYPERTENSION) WRITTEN ON 07/25/2014 12:11 AM BY BLYTH, STACEY A, MD  Well controlled, no changes to meds. Encouraged heart healthy diet such as the DASH diet and exercise as tolerated.

## 2014-07-25 NOTE — Assessment & Plan Note (Signed)
Continue vitamin d supplements at 5000 IU daily and recheck with next visit.

## 2014-07-25 NOTE — Assessment & Plan Note (Signed)
Is traveling this week if symptoms worsen will seek care, will proceed with Echo when he returns and consider cardiology referral as indicated.

## 2014-07-25 NOTE — Assessment & Plan Note (Signed)
Urine culture negative.

## 2014-07-25 NOTE — Assessment & Plan Note (Signed)
hgba1c acceptable, minimize simple carbs. Increase exercise as tolerated. Continue current meds 

## 2014-07-25 NOTE — Assessment & Plan Note (Signed)
Tolerating statin, encouraged heart healthy diet, avoid trans fats, minimize simple carbs and saturated fats. Increase exercise as tolerated 

## 2014-07-25 NOTE — Assessment & Plan Note (Signed)
Well controlled, no changes to meds. Encouraged heart healthy diet such as the DASH diet and exercise as tolerated.  °

## 2014-08-03 ENCOUNTER — Ambulatory Visit (HOSPITAL_BASED_OUTPATIENT_CLINIC_OR_DEPARTMENT_OTHER)
Admission: RE | Admit: 2014-08-03 | Discharge: 2014-08-03 | Disposition: A | Discharge status: Home / Self Care | Payer: BC Managed Care – PPO | Source: Ambulatory Visit | Attending: Cardiovascular Disease | Admitting: Cardiovascular Disease

## 2014-08-03 DIAGNOSIS — R0609 Other forms of dyspnea: Secondary | ICD-10-CM

## 2014-08-03 DIAGNOSIS — I517 Cardiomegaly: Secondary | ICD-10-CM | POA: Insufficient documentation

## 2014-08-03 DIAGNOSIS — R0600 Dyspnea, unspecified: Secondary | ICD-10-CM

## 2014-08-03 DIAGNOSIS — I10 Essential (primary) hypertension: Secondary | ICD-10-CM | POA: Diagnosis not present

## 2014-08-03 DIAGNOSIS — E119 Type 2 diabetes mellitus without complications: Secondary | ICD-10-CM | POA: Diagnosis not present

## 2014-08-03 NOTE — Progress Notes (Signed)
Echocardiogram 2D Echocardiogram has been performed.  Steve Beasley 08/03/2014, 9:37 AM

## 2014-08-23 ENCOUNTER — Encounter: Payer: Self-pay | Admitting: Family Medicine

## 2014-08-31 ENCOUNTER — Other Ambulatory Visit: Payer: Self-pay | Admitting: Family Medicine

## 2014-08-31 NOTE — Telephone Encounter (Signed)
Done

## 2014-08-31 NOTE — Telephone Encounter (Signed)
Caller name:Kaneshiro, Siyon Linck Relation to RA:QTMA  Call back number: [phone] Pharmacy: [facility] [phone]  Reason for call:  Pt requesting a refill of losartan-hydrochlorothiazide (HYZAAR) 100-25 MG per tablet

## 2014-09-13 ENCOUNTER — Encounter: Payer: Self-pay | Admitting: Family Medicine

## 2014-09-13 ENCOUNTER — Ambulatory Visit (INDEPENDENT_AMBULATORY_CARE_PROVIDER_SITE_OTHER): Payer: BC Managed Care – PPO | Admitting: Family Medicine

## 2014-09-13 VITALS — BP 142/77 | HR 69 | Temp 98.3°F | Wt 232.6 lb

## 2014-09-13 DIAGNOSIS — I517 Cardiomegaly: Secondary | ICD-10-CM

## 2014-09-13 DIAGNOSIS — E785 Hyperlipidemia, unspecified: Secondary | ICD-10-CM

## 2014-09-13 DIAGNOSIS — R51 Headache: Secondary | ICD-10-CM

## 2014-09-13 DIAGNOSIS — M109 Gout, unspecified: Secondary | ICD-10-CM

## 2014-09-13 DIAGNOSIS — I10 Essential (primary) hypertension: Secondary | ICD-10-CM

## 2014-09-13 DIAGNOSIS — R519 Headache, unspecified: Secondary | ICD-10-CM

## 2014-09-13 DIAGNOSIS — R5383 Other fatigue: Secondary | ICD-10-CM

## 2014-09-13 DIAGNOSIS — E559 Vitamin D deficiency, unspecified: Secondary | ICD-10-CM

## 2014-09-13 DIAGNOSIS — E669 Obesity, unspecified: Secondary | ICD-10-CM

## 2014-09-13 DIAGNOSIS — M25571 Pain in right ankle and joints of right foot: Secondary | ICD-10-CM

## 2014-09-13 DIAGNOSIS — R0683 Snoring: Secondary | ICD-10-CM

## 2014-09-13 LAB — URIC ACID: URIC ACID, SERUM: 8.1 mg/dL — AB (ref 4.0–7.8)

## 2014-09-13 LAB — VITAMIN D 25 HYDROXY (VIT D DEFICIENCY, FRACTURES): VITD: 43.97 ng/mL (ref 30.00–100.00)

## 2014-09-13 NOTE — Progress Notes (Signed)
Pre visit review using our clinic review tool, if applicable. No additional management support is needed unless otherwise documented below in the visit note. 

## 2014-09-13 NOTE — Patient Instructions (Signed)
Minimize sodium, caffeine, alcohol  Sleep Apnea  Sleep apnea is a sleep disorder characterized by abnormal pauses in breathing while you sleep. When your breathing pauses, the level of oxygen in your blood decreases. This causes you to move out of deep sleep and into light sleep. As a result, your quality of sleep is poor, and the system that carries your blood throughout your body (cardiovascular system) experiences stress. If sleep apnea remains untreated, the following conditions can develop:  High blood pressure (hypertension).  Coronary artery disease.  Inability to achieve or maintain an erection (impotence).  Impairment of your thought process (cognitive dysfunction). There are three types of sleep apnea: 1. Obstructive sleep apnea--Pauses in breathing during sleep because of a blocked airway. 2. Central sleep apnea--Pauses in breathing during sleep because the area of the brain that controls your breathing does not send the correct signals to the muscles that control breathing. 3. Mixed sleep apnea--A combination of both obstructive and central sleep apnea. RISK FACTORS The following risk factors can increase your risk of developing sleep apnea:  Being overweight.  Smoking.  Having narrow passages in your nose and throat.  Being of older age.  Being male.  Alcohol use.  Sedative and tranquilizer use.  Ethnicity. Among individuals younger than 35 years, African Americans are at increased risk of sleep apnea. SYMPTOMS   Difficulty staying asleep.  Daytime sleepiness and fatigue.  Loss of energy.  Irritability.  Loud, heavy snoring.  Morning headaches.  Trouble concentrating.  Forgetfulness.  Decreased interest in sex. DIAGNOSIS  In order to diagnose sleep apnea, your caregiver will perform a physical examination. Your caregiver may suggest that you take a home sleep test. Your caregiver may also recommend that you spend the night in a sleep lab. In the  sleep lab, several monitors record information about your heart, lungs, and brain while you sleep. Your leg and arm movements and blood oxygen level are also recorded. TREATMENT The following actions may help to resolve mild sleep apnea:  Sleeping on your side.   Using a decongestant if you have nasal congestion.   Avoiding the use of depressants, including alcohol, sedatives, and narcotics.   Losing weight and modifying your diet if you are overweight. There also are devices and treatments to help open your airway:  Oral appliances. These are custom-made mouthpieces that shift your lower jaw forward and slightly open your bite. This opens your airway.  Devices that create positive airway pressure. This positive pressure "splints" your airway open to help you breathe better during sleep. The following devices create positive airway pressure:  Continuous positive airway pressure (CPAP) device. The CPAP device creates a continuous level of air pressure with an air pump. The air is delivered to your airway through a mask while you sleep. This continuous pressure keeps your airway open.  Nasal expiratory positive airway pressure (EPAP) device. The EPAP device creates positive air pressure as you exhale. The device consists of single-use valves, which are inserted into each nostril and held in place by adhesive. The valves create very little resistance when you inhale but create much more resistance when you exhale. That increased resistance creates the positive airway pressure. This positive pressure while you exhale keeps your airway open, making it easier to breath when you inhale again.  Bilevel positive airway pressure (BPAP) device. The BPAP device is used mainly in patients with central sleep apnea. This device is similar to the CPAP device because it also uses an air pump  to deliver continuous air pressure through a mask. However, with the BPAP machine, the pressure is set at two different  levels. The pressure when you exhale is lower than the pressure when you inhale.  Surgery. Typically, surgery is only done if you cannot comply with less invasive treatments or if the less invasive treatments do not improve your condition. Surgery involves removing excess tissue in your airway to create a wider passage way. Document Released: 09/06/2002 Document Revised: 01/11/2013 Document Reviewed: 01/23/2012 [facility] Patient Information 2015 [place], [place]. This information is not intended to replace advice given to you by your health care provider. Make sure you discuss any questions you have with your health care provider.

## 2014-09-13 NOTE — Progress Notes (Signed)
Steve Beasley  [id number] 1965-11-04 09/13/2014      Progress Note-Follow Up  Subjective  Chief Complaint  Chief Complaint  Patient presents with  . Follow-up  . Foot Pain    (R) since Friday    HPI  Patient is a 48 y.o. male in today for routine medical care. Patient insisted in for follow-up. Is noting recent increase in cough but overall feeling well. Has had some episodes of facial sleep. Reports history of falling asleep and staying asleep. Time in recent echocardiogram right atrial and right ventricular lead. Has been diagnosed with dust mite allergy. Is also complaining of pain in his right great toe. No redness or warmth. Denies CP/palp/SOB/HA/congestion/fevers/GI or GU c/o. Taking meds as prescribed  Past Medical History  Diagnosis Date  . Diabetes mellitus without complication   . Hypertension   . Osteopenia   . Testicular cancer   . Allergy-induced asthma   . Obesity, unspecified 06/06/2013  . Nicotine use disorder 06/06/2013    smokeless  . Preventative health care 09/19/2013  . Anxiety state, unspecified 09/19/2013  . Fatty liver disease, nonalcoholic 41/32/4401  . Other and unspecified hyperlipidemia 01/19/2014    Past Surgical History  Procedure Laterality Date  . Testicular cancer surgery      Family History  Problem Relation Age of Onset  . Diabetes Mother   . Breast cancer Mother   . Bone cancer Mother   . Cancer Mother     breast with bone mets  . Depression Mother   . Hypertension Father   . Prostate cancer Father   . Hyperlipidemia Father   . Cancer Father     prostate  . Heart attack Neg Hx   . Sudden death Neg Hx   . Stroke Paternal Grandfather   . Prostate cancer Maternal Grandfather   . Hypertension Brother   . Gallstones Brother   . Anxiety disorder Daughter   . ADD / ADHD Daughter   . Bulemia Daughter   . Depression Son   . Anxiety disorder Maternal Aunt     social anxiety, anger    History   Social History  . Marital  Status: Married    Spouse Name: N/A    Number of Children: N/A  . Years of Education: N/A   Occupational History  . Not on file.   Social History Main Topics  . Smoking status: Former Research scientist (life sciences)  . Smokeless tobacco: Not on file  . Alcohol Use: Yes  . Drug Use: No  . Sexual Activity: Yes   Other Topics Concern  . Not on file   Social History Narrative    Current Outpatient Prescriptions on File Prior to Visit  Medication Sig Dispense Refill  . albuterol (PROVENTIL HFA;VENTOLIN HFA) 108 (90 BASE) MCG/ACT inhaler Inhale 2 puffs into the lungs every 6 (six) hours as needed for wheezing or shortness of breath. 1 Inhaler 2  . atorvastatin (LIPITOR) 10 MG tablet TAKE ONE TABLET BY MOUTH NIGHTLY AT BEDTIME  30 tablet 1  . buPROPion (WELLBUTRIN SR) 150 MG 12 hr tablet Take 1 tablet (150 mg total) by mouth 2 (two) times daily. 60 tablet 5  . Cholecalciferol (VITAMIN D3) 5000 UNITS CAPS Take 1 capsule by mouth daily.    Marland Kitchen losartan-hydrochlorothiazide (HYZAAR) 100-25 MG per tablet TAKE ONE TABLET BY MOUTH ONE TIME DAILY  30 tablet 6  . metFORMIN (GLUCOPHAGE) 1000 MG tablet TAKE ONE TABLET TWICE DAILY WITH MEALS  60 tablet 2  .  Probiotic Product (ALIGN) 4 MG CAPS Take 1 tablet by mouth.     No current facility-administered medications on file prior to visit.    No Known Allergies  Review of Systems  Review of Systems  Constitutional: Positive for malaise/fatigue. Negative for fever.  HENT: Negative for congestion.   Eyes: Negative for discharge.  Respiratory: Positive for shortness of breath.   Cardiovascular: Negative for chest pain, palpitations and leg swelling.  Gastrointestinal: Negative for nausea, abdominal pain and diarrhea.  Genitourinary: Negative for dysuria.  Musculoskeletal: Negative for falls.  Skin: Negative for rash.  Neurological: Negative for loss of consciousness and headaches.  Endo/Heme/Allergies: Negative for polydipsia.  Psychiatric/Behavioral: Negative for  depression and suicidal ideas. The patient is not nervous/anxious and does not have insomnia.     Objective  BP 142/77 mmHg  Pulse 69  Temp(Src) 98.3 F (36.8 C) (Oral)  Wt 232 lb 9.6 oz (105.507 kg)  SpO2 100%  Physical Exam  Physical Exam  Constitutional: He is oriented to person, place, and time and well-developed, well-nourished, and in no distress. No distress.  HENT:  Head: Normocephalic and atraumatic.  Eyes: Conjunctivae are normal.  Neck: Neck supple. No thyromegaly present.  Cardiovascular: Normal rate, regular rhythm and normal heart sounds.   No murmur heard. Pulmonary/Chest: Effort normal and breath sounds normal. No respiratory distress.  Abdominal: He exhibits no distension and no mass. There is no tenderness.  Musculoskeletal: He exhibits no edema.  Neurological: He is alert and oriented to person, place, and time.  Skin: Skin is warm.  Psychiatric: Memory, affect and judgment normal.    Lab Results  Component Value Date   TSH 1.92 07/06/2014   Lab Results  Component Value Date   WBC 9.4 07/06/2014   HGB 14.8 07/06/2014   HCT 44.1 07/06/2014   MCV 86.4 07/06/2014   PLT 157.0 07/06/2014   Lab Results  Component Value Date   CREATININE 1.3 07/06/2014   BUN 18 07/06/2014   NA 138 07/06/2014   K 4.1 07/06/2014   CL 102 07/06/2014   CO2 30 07/06/2014   Lab Results  Component Value Date   ALT 62* 07/06/2014   AST 34 07/06/2014   ALKPHOS 81 07/06/2014   BILITOT 1.3* 07/06/2014   Lab Results  Component Value Date   CHOL 127 07/06/2014   Lab Results  Component Value Date   HDL 29.60* 07/06/2014   Lab Results  Component Value Date   LDLCALC 60 07/06/2014   Lab Results  Component Value Date   TRIG 186.0* 07/06/2014   Lab Results  Component Value Date   CHOLHDL 4 07/06/2014     Assessment & Plan  HTN (hypertension) Well controlled, no changes to meds. Encouraged heart healthy diet such as the DASH diet and exercise as tolerated.  Encouraged to minimize caffeine and sodium   Hyperlipidemia Encouraged heart healthy diet, increase exercise, avoid trans fats, consider a krill oil cap daily. Tolerating statin  Obesity Encouraged DASH diet, decrease po intake and increase exercise as tolerated. Needs 7-8 hours of sleep nightly. Avoid trans fats, eat small, frequent meals every 4-5 hours with lean proteins, complex carbs and healthy fats. Minimize simple carbs, GMO foods.  Right heart enlargement Echo findings show enlarged right side of heart with elevated bp. Will explore sleep study due to snoring, fitful sleep, frequent awakenings. Encouraged DASH diet, decrease po intake and increase exercise as tolerated. Needs 7-8 hours of sleep nightly. Avoid trans fats, eat small, frequent meals  every 4-5 hours with lean proteins, complex carbs and healthy fats. Minimize simple carbs  Gout Recurrent pain at base of right great toe noted to have increased uric acid, encouraged increased hydration and good footwear. Start Allopurinol dialy and Aleve prn

## 2014-09-14 ENCOUNTER — Encounter: Payer: Self-pay | Admitting: Family Medicine

## 2014-09-15 ENCOUNTER — Telehealth: Payer: Self-pay | Admitting: Family Medicine

## 2014-09-15 ENCOUNTER — Other Ambulatory Visit: Payer: Self-pay | Admitting: Family Medicine

## 2014-09-15 NOTE — Telephone Encounter (Signed)
Caller name: Elyon Relation to pt: self Call back number: [phone] [facility] [address]  Reason for call:   Patient thought that Dr. Charlett Blake was going to call in meloxicam and allopurinol to the pharmacy. Also, patient requesting a prescription of life scan one touch ultra blue test strips to be sent to pharmacy

## 2014-09-15 NOTE — Telephone Encounter (Signed)
Please advise.//AB/CMA 

## 2014-09-15 NOTE — Telephone Encounter (Signed)
OK to send in Meloxicam 15 mg po daily prn pain with food. Disp #30 with 2 rf, also Allopurinol 100 mg po daily disp #30 with 2 rf. Also OK to send in life scan one touch ultra blue test strips at patient request. Sig: check blood sugar daily and prn. Disp #100 with 2 rf

## 2014-09-16 ENCOUNTER — Telehealth: Payer: Self-pay | Admitting: Family Medicine

## 2014-09-16 MED ORDER — GLUCOSE BLOOD VI STRP: ORAL_STRIP | Status: DC

## 2014-09-16 MED ORDER — MELOXICAM 15 MG PO TABS: ORAL_TABLET | ORAL | Status: DC

## 2014-09-16 MED ORDER — ALLOPURINOL 100 MG PO TABS: 100.0000 mg | ORAL_TABLET | Freq: Every day | ORAL | Status: DC

## 2014-09-16 MED ORDER — ATORVASTATIN CALCIUM 10 MG PO TABS
10.0000 mg | ORAL_TABLET | Freq: Every day | ORAL | Status: DC
Start: 2014-09-16 — End: 2015-02-16

## 2014-09-16 NOTE — Telephone Encounter (Signed)
Rx sent to the pharmacy by e-script.//AB/CMA 

## 2014-09-16 NOTE — Telephone Encounter (Signed)
Caller name: Keandre, Linden Relation to pt: self  Call back number: [phone] Pharmacy: [facility] [phone]  Reason for call:  Pt requesting a refill of atorvastatin (LIPITOR) 10 MG tablet

## 2014-09-16 NOTE — Telephone Encounter (Signed)
Rx's sent to the pharmacy by e-script.//AB/CMA 

## 2014-09-18 ENCOUNTER — Encounter: Payer: Self-pay | Admitting: Family Medicine

## 2014-09-18 DIAGNOSIS — I38 Endocarditis, valve unspecified: Secondary | ICD-10-CM

## 2014-09-18 DIAGNOSIS — M109 Gout, unspecified: Secondary | ICD-10-CM

## 2014-09-18 DIAGNOSIS — I517 Cardiomegaly: Secondary | ICD-10-CM | POA: Insufficient documentation

## 2014-09-18 HISTORY — DX: Endocarditis, valve unspecified: I38

## 2014-09-18 HISTORY — DX: Gout, unspecified: M10.9

## 2014-09-18 HISTORY — DX: Cardiomegaly: I51.7

## 2014-09-18 NOTE — Assessment & Plan Note (Signed)
Recurrent pain at base of right great toe noted to have increased uric acid, encouraged increased hydration and good footwear. Start Allopurinol dialy and Aleve prn

## 2014-09-18 NOTE — Assessment & Plan Note (Signed)
Encouraged DASH diet, decrease po intake and increase exercise as tolerated. Needs 7-8 hours of sleep nightly. Avoid trans fats, eat small, frequent meals every 4-5 hours with lean proteins, complex carbs and healthy fats. Minimize simple carbs, GMO foods. 

## 2014-09-18 NOTE — Assessment & Plan Note (Addendum)
Well controlled, no changes to meds. Encouraged heart healthy diet such as the DASH diet and exercise as tolerated. Encouraged to minimize caffeine and sodium

## 2014-09-18 NOTE — Assessment & Plan Note (Signed)
>>  ASSESSMENT AND PLAN FOR HTN (HYPERTENSION) WRITTEN ON 09/18/2014  3:27 PM BY BLYTH, STACEY A, MD  Well controlled, no changes to meds. Encouraged heart healthy diet such as the DASH diet and exercise as tolerated. Encouraged to minimize caffeine and sodium

## 2014-09-18 NOTE — Assessment & Plan Note (Signed)
Echo findings show enlarged right side of heart with elevated bp. Will explore sleep study due to snoring, fitful sleep, frequent awakenings. Encouraged DASH diet, decrease po intake and increase exercise as tolerated. Needs 7-8 hours of sleep nightly. Avoid trans fats, eat small, frequent meals every 4-5 hours with lean proteins, complex carbs and healthy fats. Minimize simple carbs

## 2014-09-18 NOTE — Assessment & Plan Note (Signed)
Encouraged heart healthy diet, increase exercise, avoid trans fats, consider a krill oil cap daily. Tolerating statin 

## 2014-09-27 ENCOUNTER — Encounter: Payer: Self-pay | Admitting: Family Medicine

## 2014-10-04 ENCOUNTER — Other Ambulatory Visit: Payer: Self-pay | Admitting: Family Medicine

## 2014-10-04 NOTE — Telephone Encounter (Signed)
Rx sent to the pharmacy by e-script.//AB/CMA 

## 2014-10-16 ENCOUNTER — Other Ambulatory Visit: Payer: Self-pay | Admitting: Family Medicine

## 2014-10-17 ENCOUNTER — Encounter: Payer: Self-pay | Admitting: Pulmonary Disease

## 2014-10-17 ENCOUNTER — Ambulatory Visit (HOSPITAL_BASED_OUTPATIENT_CLINIC_OR_DEPARTMENT_OTHER): Payer: BLUE CROSS/BLUE SHIELD | Attending: Family Medicine | Admitting: Radiology

## 2014-10-17 ENCOUNTER — Ambulatory Visit (INDEPENDENT_AMBULATORY_CARE_PROVIDER_SITE_OTHER): Payer: BLUE CROSS/BLUE SHIELD | Admitting: Pulmonary Disease

## 2014-10-17 VITALS — Ht 71.0 in | Wt 223.0 lb

## 2014-10-17 VITALS — BP 128/76 | HR 72 | Temp 97.8°F | Ht 71.0 in | Wt 230.8 lb

## 2014-10-17 DIAGNOSIS — R0683 Snoring: Secondary | ICD-10-CM

## 2014-10-17 DIAGNOSIS — I517 Cardiomegaly: Secondary | ICD-10-CM

## 2014-10-17 DIAGNOSIS — G4733 Obstructive sleep apnea (adult) (pediatric): Secondary | ICD-10-CM

## 2014-10-17 DIAGNOSIS — R5383 Other fatigue: Secondary | ICD-10-CM

## 2014-10-17 DIAGNOSIS — R51 Headache: Secondary | ICD-10-CM | POA: Diagnosis not present

## 2014-10-17 DIAGNOSIS — R519 Headache, unspecified: Secondary | ICD-10-CM

## 2014-10-17 NOTE — Patient Instructions (Signed)
Will see if we can get your sleep study scheduled earlier. Work on [employer] loss Will arrange followup visit once the study is done.

## 2014-10-17 NOTE — Assessment & Plan Note (Signed)
The patient's history is very suggestive of clinically significant sleep disordered breathing. I have had a long discussion with him about the pathophysiology of sleep apnea, including its impact to cardiovascular health and quality of life. He is scheduled for a sleep study in March, but I would like to try and get this earlier based on his severe symptomatology. I have also encouraged him to work aggressively on weight loss.

## 2014-10-17 NOTE — Progress Notes (Signed)
   Subjective:    Patient ID: Steve Beasley, male    DOB: 19-May-1966, 49 y.o.   MRN: [id number]  HPI The patient is a 49 year old male who I've been asked to see for possible obstructive sleep apnea. He has been noted to have loud snoring, as well as an abnormal breathing pattern during sleep. He has also had at times both snoring and choking arousals. He has frequent awakenings at night, and does not feel rested in the mornings upon arising. He notes definite sleep pressure during the day while at work, and will often move his desk to the standing position and to drink tea. He notes definite sleepiness in the evenings while trying to watch television or movies, and has mild sleep pressure with driving longer distances. The patient's weight is up about 20 pounds over the last 2 years, and his Epworth score today is 9. It should be noted that he has had a recent echocardiogram that showed mild right ventricle dilatation, but no pulmonary hypertension by estimation.   Review of Systems  Constitutional: Negative for fever and unexpected weight change.  HENT: Negative for congestion, dental problem, ear pain, nosebleeds, postnasal drip, rhinorrhea, sinus pressure, sneezing, sore throat and trouble swallowing.   Eyes: Negative for redness and itching.  Respiratory: Negative for cough, chest tightness, shortness of breath and wheezing.   Cardiovascular: Negative for palpitations and leg swelling.  Gastrointestinal: Negative for nausea and vomiting.  Genitourinary: Negative for dysuria.  Musculoskeletal: Negative for joint swelling.  Skin: Negative for rash.  Neurological: Negative for headaches.  Hematological: Does not bruise/bleed easily.  Psychiatric/Behavioral: Negative for dysphoric mood. The patient is not nervous/anxious.        Objective:   Physical Exam Constitutional:  Overweight male, no acute distress  HENT:  Nares patent without discharge, septal deviation to the left with  narrowing.   Oropharynx without exudate, palate and uvula are thick and elongated.   Eyes:  Perrla, eomi, no scleral icterus  Neck:  No JVD, no TMG  Cardiovascular:  Normal rate, regular rhythm, no rubs or gallops.  No murmurs        Intact distal pulses  Pulmonary :  Normal breath sounds, no stridor or respiratory distress   No rales, rhonchi, or wheezing  Abdominal:  Soft, nondistended, bowel sounds present.  No tenderness noted.   Musculoskeletal:  No lower extremity edema noted.  Lymph Nodes:  No cervical lymphadenopathy noted  Skin:  No cyanosis noted  Neurologic:  Alert, appropriate, moves all 4 extremities without obvious deficit.         Assessment & Plan:

## 2014-10-21 ENCOUNTER — Other Ambulatory Visit: Payer: BLUE CROSS/BLUE SHIELD

## 2014-10-24 ENCOUNTER — Other Ambulatory Visit (INDEPENDENT_AMBULATORY_CARE_PROVIDER_SITE_OTHER): Payer: BLUE CROSS/BLUE SHIELD

## 2014-10-24 DIAGNOSIS — E119 Type 2 diabetes mellitus without complications: Secondary | ICD-10-CM | POA: Diagnosis not present

## 2014-10-24 DIAGNOSIS — R10A0 Flank pain, unspecified side: Secondary | ICD-10-CM

## 2014-10-24 DIAGNOSIS — E559 Vitamin D deficiency, unspecified: Secondary | ICD-10-CM

## 2014-10-24 DIAGNOSIS — E785 Hyperlipidemia, unspecified: Secondary | ICD-10-CM

## 2014-10-24 DIAGNOSIS — E669 Obesity, unspecified: Secondary | ICD-10-CM

## 2014-10-24 DIAGNOSIS — L989 Disorder of the skin and subcutaneous tissue, unspecified: Secondary | ICD-10-CM | POA: Diagnosis not present

## 2014-10-24 DIAGNOSIS — R109 Unspecified abdominal pain: Secondary | ICD-10-CM | POA: Diagnosis not present

## 2014-10-24 DIAGNOSIS — I10 Essential (primary) hypertension: Secondary | ICD-10-CM | POA: Diagnosis not present

## 2014-10-24 DIAGNOSIS — R350 Frequency of micturition: Secondary | ICD-10-CM | POA: Diagnosis not present

## 2014-10-24 DIAGNOSIS — L578 Other skin changes due to chronic exposure to nonionizing radiation: Secondary | ICD-10-CM | POA: Diagnosis not present

## 2014-10-24 DIAGNOSIS — E118 Type 2 diabetes mellitus with unspecified complications: Secondary | ICD-10-CM

## 2014-10-24 DIAGNOSIS — E1169 Type 2 diabetes mellitus with other specified complication: Secondary | ICD-10-CM

## 2014-10-24 DIAGNOSIS — R0600 Dyspnea, unspecified: Secondary | ICD-10-CM

## 2014-10-24 LAB — RENAL FUNCTION PANEL
Albumin: 4 g/dL (ref 3.5–5.2)
BUN: 14 mg/dL (ref 6–23)
CO2: 28 mEq/L (ref 19–32)
Calcium: 10.2 mg/dL (ref 8.4–10.5)
Chloride: 101 mEq/L (ref 96–112)
Creatinine, Ser: 1.21 mg/dL (ref 0.40–1.50)
GFR: 67.59 mL/min (ref >60.00)
GLUCOSE: 135 mg/dL — AB (ref 70–99)
Phosphorus: 2.3 mg/dL (ref 2.3–4.6)
Potassium: 3.5 mEq/L (ref 3.5–5.1)
Sodium: 136 mEq/L (ref 135–145)

## 2014-10-24 LAB — HEPATIC FUNCTION PANEL
ALK PHOS: 86 U/L (ref 39–117)
ALT: 48 U/L (ref 0–53)
AST: 27 U/L (ref 0–37)
Albumin: 4 g/dL (ref 3.5–5.2)
BILIRUBIN TOTAL: 0.8 mg/dL (ref 0.2–1.2)
Bilirubin, Direct: 0.2 mg/dL (ref 0.0–0.3)
TOTAL PROTEIN: 6.6 g/dL (ref 6.0–8.3)

## 2014-10-24 LAB — LIPID PANEL
Cholesterol: 93 mg/dL (ref 0–200)
HDL: 31 mg/dL — AB (ref >39.00)
LDL Cholesterol: 33 mg/dL (ref 0–99)
NonHDL: 62
TRIGLYCERIDES: 147 mg/dL (ref 0.0–149.0)
Total CHOL/HDL Ratio: 3
VLDL: 29.4 mg/dL (ref 0.0–40.0)

## 2014-10-24 LAB — HEMOGLOBIN A1C: Hgb A1c MFr Bld: 6.3 % (ref 4.6–6.5)

## 2014-10-24 LAB — VITAMIN D 25 HYDROXY (VIT D DEFICIENCY, FRACTURES): VITD: 39.49 ng/mL (ref 30.00–100.00)

## 2014-10-24 LAB — CBC
HCT: 42.2 % (ref 39.0–52.0)
Hemoglobin: 14.4 g/dL (ref 13.0–17.0)
MCHC: 34.2 g/dL (ref 30.0–36.0)
MCV: 84.9 fl (ref 78.0–100.0)
RBC: 4.97 Mil/uL (ref 4.22–5.81)
RDW: 12.8 % (ref 11.5–15.5)
WBC: 7.5 10*3/uL (ref 4.0–10.5)

## 2014-10-24 LAB — TSH: TSH: 1.88 u[IU]/mL (ref 0.35–4.50)

## 2014-10-25 ENCOUNTER — Telehealth: Payer: Self-pay | Admitting: Pulmonary Disease

## 2014-10-25 DIAGNOSIS — G4730 Sleep apnea, unspecified: Secondary | ICD-10-CM

## 2014-10-25 NOTE — Sleep Study (Signed)
[place]   NAME: Steve Beasley  DATE OF BIRTH: April 12, 1966  MEDICAL RECORD [id number]  LOCATION: [facility]   PHYSICIAN: ALVA,RAKESH V.   DATE OF STUDY:    SLEEP STUDY TYPE: Nocturnal Polysomnogram   REFERRING PHYSICIAN: Rigoberto Noel, MD   INDICATION FOR STUDY:  49 year old man with loud snoring, excessive daytime fatigue, restless and non-refreshing sleep, with choking episodes. At the time of this study ,they weighed 233 pounds with a height of 5 ft 11 inches and the BMI of 31, neck size of 17 inches. Epworth sleepiness score was 11   This nocturnal polysomnogram was performed with a sleep technologist in attendance. EEG, EOG,EMG and respiratory parameters recorded. Sleep stages, arousals, limb movements and respiratory data was scored according to criteria laid out by the American Academy of sleep medicine.   SLEEP ARCHITECTURE: Lights out was at 2236 PM and lights on was at 447 AM. Total sleep time was 283 minutes with a sleep period time of 313 minutes and a sleep efficiency of 76 %. Sleep latency was 49 minutes with latency to REM sleep of 83 minutes and wake after sleep onset of 40 minutes. . Sleep stages as a percentage of total sleep time was N1 -17 %,N2- 67 % and REM sleep 16 % ( 46 minutes) . The longest period of REM sleep was around 2 AM.   AROUSAL DATA : There were 120  arousals with an arousal index of 25 events per hour. Most of these were spontaneous & 22 were associated with respiratory events  RESPIRATORY DATA: There were 1 obstructive apneas, 4 central apneas, 0 mixed apneas and 33 hypopneas with apnea -hypopnea index of 8 events per hour. There were 64 RERAs with an RDI of 22 events per hour. Supine sleep was noted. Events were worse during supine REM sleep.  MOVEMENT/PARASOMNIA: There were 0 PLMS with a PLM index of 0 events per hour. The PLM arousal index was 0 per hour.  OXYGEN DATA: The lowest desaturation  was 78 % during REM sleep and the desaturation index was 11 per hour.   CARDIAC DATA: The low heart rate was 50 beats per minute. The high heart rate recorded was an artifact. No arrhythmias were noted   DISCUSSION -Loud snoring was noted . he did not meet criteria for CPAP intervention. he was desensitized with a medium nasal pillows.   IMPRESSION :  1. Mild obstructive sleep apnea with hypopneas causing sleep fragmentation and severe oxygen desaturation. Events were predominantly noted during REM supine sleep. There were desaturations without respiratory events. 2. No evidence of cardiac arrhythmias,periodic limb movements or behavioral disturbance during sleep.  3. Sleep efficiency was moderately poor.  RECOMMENDATION:  1. Treatment options for this degree of sleep disordered breathing include weight loss, CPAP therapy and/ or oral appliance.  2. Patient should be cautioned against driving when sleepy  3. They should be asked to avoid medications with sedative side effects  4. Given severity of desaturation, consider evaluation for underlying cardiopulmonary disease.   Rigoberto Noel MD Diplomate, American Board of Sleep Medicine    ELECTRONICALLY SIGNED ON: 10/25/2014  [facility]  PH: [phone] FX: [phone]  [place]

## 2014-10-25 NOTE — Telephone Encounter (Signed)
lmomtcb x1 

## 2014-10-25 NOTE — Telephone Encounter (Signed)
Mindy, pt needs ov to review recent sleep study.  Thanks.

## 2014-10-26 NOTE — Telephone Encounter (Signed)
lmomtcb x 2  

## 2014-10-26 NOTE — Telephone Encounter (Signed)
Called spoke with pt. appt scheduled 1/28

## 2014-10-27 ENCOUNTER — Encounter: Payer: Self-pay | Admitting: Pulmonary Disease

## 2014-10-27 ENCOUNTER — Ambulatory Visit (INDEPENDENT_AMBULATORY_CARE_PROVIDER_SITE_OTHER): Payer: BLUE CROSS/BLUE SHIELD | Admitting: Pulmonary Disease

## 2014-10-27 VITALS — BP 112/66 | HR 63 | Temp 97.0°F | Ht 71.0 in | Wt 227.6 lb

## 2014-10-27 DIAGNOSIS — G4733 Obstructive sleep apnea (adult) (pediatric): Secondary | ICD-10-CM

## 2014-10-27 NOTE — Patient Instructions (Signed)
You have very mild sleep apnea.  Treatment can include a trial of weight loss, dental appliance, and cpap.  Think about your various options, and let me know.

## 2014-10-27 NOTE — Progress Notes (Signed)
   Subjective:    Patient ID: Steve Beasley, male    DOB: 1966-02-26, 49 y.o.   MRN: [id number]  HPI The patient comes in today for follow-up of his recent sleep study. He was found to have very mild obstructive sleep apnea, with an AHI of only 8 events per hour. He did have significant desaturation during REM as low as 78%, but was very transient in nature. I have had a long discussion with him about his sleep study and the various treatment options, and answered all of his questions.   Review of Systems  Constitutional: Negative for fever and unexpected weight change.  HENT: Negative for congestion, dental problem, ear pain, nosebleeds, postnasal drip, rhinorrhea, sinus pressure, sneezing, sore throat and trouble swallowing.   Eyes: Negative for redness and itching.  Respiratory: Negative for cough, chest tightness, shortness of breath and wheezing.   Cardiovascular: Negative for palpitations and leg swelling.  Gastrointestinal: Negative for nausea and vomiting.  Genitourinary: Negative for dysuria.  Musculoskeletal: Negative for joint swelling.  Skin: Negative for rash.  Neurological: Negative for headaches.  Hematological: Does not bruise/bleed easily.  Psychiatric/Behavioral: Negative for dysphoric mood. The patient is not nervous/anxious.        Objective:   Physical Exam Overweight male in no acute distress Nose without purulence or discharge noted Neck without lymphadenopathy or thyromegaly Lower extremities with minimal edema, no cyanosis Alert and oriented, moves all 4 extremities.       Assessment & Plan:

## 2014-10-27 NOTE — Assessment & Plan Note (Signed)
The patient has very mild obstructive sleep apnea with transient desaturation on his sleep study, and therefore I suspect this is not the etiology for his mild right ventricular dilatation. I have discussed with him the various approaches to mild sleep apnea, including a trial of weight loss alone, dental appliance, and also CPAP. The decision to treat this aggressively should be based upon its impact to his and his wife's quality of life. The patient would like to think about his various options, and will let me know. He may also need further evaluation of his mild right ventricular dilatation.

## 2014-11-04 ENCOUNTER — Encounter: Payer: Self-pay | Admitting: Family Medicine

## 2014-11-04 ENCOUNTER — Ambulatory Visit (INDEPENDENT_AMBULATORY_CARE_PROVIDER_SITE_OTHER): Payer: BLUE CROSS/BLUE SHIELD | Admitting: Family Medicine

## 2014-11-04 VITALS — BP 134/74 | HR 66 | Temp 97.9°F | Ht 71.0 in | Wt 230.2 lb

## 2014-11-04 DIAGNOSIS — R739 Hyperglycemia, unspecified: Secondary | ICD-10-CM

## 2014-11-04 DIAGNOSIS — G4733 Obstructive sleep apnea (adult) (pediatric): Secondary | ICD-10-CM

## 2014-11-04 DIAGNOSIS — E119 Type 2 diabetes mellitus without complications: Secondary | ICD-10-CM

## 2014-11-04 DIAGNOSIS — L989 Disorder of the skin and subcutaneous tissue, unspecified: Secondary | ICD-10-CM

## 2014-11-04 DIAGNOSIS — R0789 Other chest pain: Secondary | ICD-10-CM

## 2014-11-04 DIAGNOSIS — E785 Hyperlipidemia, unspecified: Secondary | ICD-10-CM

## 2014-11-04 DIAGNOSIS — I10 Essential (primary) hypertension: Secondary | ICD-10-CM

## 2014-11-04 NOTE — Patient Instructions (Signed)

## 2014-11-04 NOTE — Progress Notes (Signed)
Pre visit review using our clinic review tool, if applicable. No additional management support is needed unless otherwise documented below in the visit note. 

## 2014-11-13 ENCOUNTER — Encounter: Payer: Self-pay | Admitting: Family Medicine

## 2014-11-13 DIAGNOSIS — R0789 Other chest pain: Secondary | ICD-10-CM | POA: Insufficient documentation

## 2014-11-13 HISTORY — DX: Other chest pain: R07.89

## 2014-11-13 NOTE — Assessment & Plan Note (Signed)
Using CPAP routinely, feels well

## 2014-11-13 NOTE — Assessment & Plan Note (Signed)
Improved on recheck. Well controlled, no changes to meds. Encouraged heart healthy diet such as the DASH diet and exercise as tolerated.  

## 2014-11-13 NOTE — Assessment & Plan Note (Signed)
Tolerating statin, encouraged heart healthy diet, avoid trans fats, minimize simple carbs and saturated fats. Increase exercise as tolerated 

## 2014-11-13 NOTE — Assessment & Plan Note (Signed)
Had some episodes of chest tightness in past couple of months, one was during a golf game. patient needs 81 mg Aspirin and fo call if symptoms return. Referred to cardiology

## 2014-11-13 NOTE — Assessment & Plan Note (Signed)
hgba1c acceptable, minimize simple carbs. Increase exercise as tolerated. Continue current meds 

## 2014-11-13 NOTE — Assessment & Plan Note (Signed)
>>  ASSESSMENT AND PLAN FOR HTN (HYPERTENSION) WRITTEN ON 11/13/2014  9:08 PM BY BLYTH, STACEY A, MD  Improved on recheck. Well controlled, no changes to meds. Encouraged heart healthy diet such as the DASH diet and exercise as tolerated.

## 2014-11-13 NOTE — Progress Notes (Signed)
Steve Beasley  [id number] [birth year] 11/13/2014      Progress Note-Follow Up  Subjective  Chief Complaint  Chief Complaint  Patient presents with  . Follow-up    3 mos    HPI  Patient is a 50 y.o. male in today for routine medical care. Patient is in today for follow-up. Reports he feels well today but has had a couple of concerning episodes recently. Has had 2 episodes of chest tightness while he was active over the last couple of months. One was while playing golf in the 95 weather. No syncope, nausea, palpitations associated with this episode. Otherwise has felt well. No recent fever or acute illness. Denies/palp/SOB/HA/congestion/fevers/GI or GU c/o. Taking meds as prescribed  Past Medical History  Diagnosis Date  . Diabetes mellitus without complication   . Hypertension   . Osteopenia   . Testicular cancer   . Allergy-induced asthma   . Obesity, unspecified 06/06/2013  . Nicotine use disorder 06/06/2013    smokeless  . Preventative health care 09/19/2013  . Anxiety state, unspecified 09/19/2013  . Fatty liver disease, nonalcoholic 80/16/5537  . Other and unspecified hyperlipidemia 01/19/2014  . Valvular heart disease 09/18/2014  . Right heart enlargement 09/18/2014  . Gout 09/18/2014  . Atypical chest pain 11/13/2014    Past Surgical History  Procedure Laterality Date  . Testicular cancer surgery      Family History  Problem Relation Age of Onset  . Diabetes Mother   . Breast cancer Mother   . Bone cancer Mother   . Cancer Mother     breast with bone mets  . Depression Mother   . Hypertension Father   . Prostate cancer Father   . Hyperlipidemia Father   . Cancer Father     prostate  . Heart attack Neg Hx   . Sudden death Neg Hx   . Stroke Paternal Grandfather   . Prostate cancer Maternal Grandfather   . Hypertension Brother   . Gallstones Brother   . Anxiety disorder Daughter   . ADD / ADHD Daughter   . Bulemia Daughter   . Depression Son   .  Anxiety disorder Maternal Aunt     social anxiety, anger    History   Social History  . Marital Status: Married    Spouse Name: N/A  . Number of Children: 2  . Years of Education: N/A   Occupational History  . finance    Social History Main Topics  . Smoking status: Former Smoker    Types: Cigarettes  . Smokeless tobacco: Former Systems developer    Quit date: 02/28/2014     Comment: 30 years ago socially  . Alcohol Use: 0.0 oz/week    0 Standard drinks or equivalent per week  . Drug Use: No  . Sexual Activity: Yes   Other Topics Concern  . Not on file   Social History Narrative    Current Outpatient Prescriptions on File Prior to Visit  Medication Sig Dispense Refill  . albuterol (PROVENTIL HFA;VENTOLIN HFA) 108 (90 BASE) MCG/ACT inhaler Inhale 2 puffs into the lungs every 6 (six) hours as needed for wheezing or shortness of breath. 1 Inhaler 2  . allopurinol (ZYLOPRIM) 100 MG tablet Take 1 tablet (100 mg total) by mouth daily. 30 tablet 2  . atorvastatin (LIPITOR) 10 MG tablet Take 1 tablet (10 mg total) by mouth at bedtime. 30 tablet 5  . buPROPion (WELLBUTRIN SR) 150 MG 12 hr tablet TAKE ONE TABLET BY  MOUTH TWICE DAILY  60 tablet 4  . Cholecalciferol (VITAMIN D3) 5000 UNITS CAPS Take 1 capsule by mouth daily.    Marland Kitchen glucose blood test strip CHECK BLOOD SUGAR DAILY AND PRN. 100 each 2  . losartan-hydrochlorothiazide (HYZAAR) 100-25 MG per tablet TAKE ONE TABLET BY MOUTH ONE TIME DAILY  30 tablet 6  . meloxicam (MOBIC) 15 MG tablet Take 1 tablet by mouth daily as needed for pain with food. 30 tablet 2  . metFORMIN (GLUCOPHAGE) 1000 MG tablet TAKE ONE TABLET TWICE DAILY WITH MEALS  60 tablet 1  . Probiotic Product (ALIGN) 4 MG CAPS Take 1 tablet by mouth.     No current facility-administered medications on file prior to visit.    No Known Allergies  Review of Systems  Review of Systems  Constitutional: Negative for fever and malaise/fatigue.  HENT: Negative for congestion.     Eyes: Negative for discharge.  Respiratory: Negative for shortness of breath.   Cardiovascular: Positive for chest pain. Negative for palpitations and leg swelling.  Gastrointestinal: Negative for nausea, abdominal pain and diarrhea.  Genitourinary: Negative for dysuria.  Musculoskeletal: Negative for falls.  Skin: Negative for rash.  Neurological: Negative for loss of consciousness and headaches.  Endo/Heme/Allergies: Negative for polydipsia.  Psychiatric/Behavioral: Negative for depression and suicidal ideas. The patient is not nervous/anxious and does not have insomnia.     Objective  BP 134/74 mmHg  Pulse 66  Temp(Src) 97.9 F (36.6 C) (Oral)  Ht 5\' 11"  (1.803 m)  Wt 230 lb 3.2 oz (104.418 kg)  BMI 32.12 kg/m2  SpO2 99%  Physical Exam  Physical Exam  Constitutional: He is oriented to person, place, and time and well-developed, well-nourished, and in no distress. No distress.  HENT:  Head: Normocephalic and atraumatic.  Eyes: Conjunctivae are normal.  Neck: Neck supple. No thyromegaly present.  Cardiovascular: Normal rate, regular rhythm and normal heart sounds.   No murmur heard. Pulmonary/Chest: Effort normal and breath sounds normal. No respiratory distress.  Abdominal: He exhibits no distension and no mass. There is no tenderness.  Musculoskeletal: He exhibits no edema.  Neurological: He is alert and oriented to person, place, and time.  Skin: Skin is warm.  Psychiatric: Memory, affect and judgment normal.    Lab Results  Component Value Date   TSH 1.88 10/24/2014   Lab Results  Component Value Date   WBC 7.5 10/24/2014   HGB 14.4 10/24/2014   HCT 42.2 10/24/2014   MCV 84.9 10/24/2014   PLT 146.0 Repeated and verified X2.* 10/24/2014   Lab Results  Component Value Date   CREATININE 1.21 10/24/2014   BUN 14 10/24/2014   NA 136 10/24/2014   K 3.5 10/24/2014   CL 101 10/24/2014   CO2 28 10/24/2014   Lab Results  Component Value Date   ALT 48  10/24/2014   AST 27 10/24/2014   ALKPHOS 86 10/24/2014   BILITOT 0.8 10/24/2014   Lab Results  Component Value Date   CHOL 93 10/24/2014   Lab Results  Component Value Date   HDL 31.00* 10/24/2014   Lab Results  Component Value Date   LDLCALC 33 10/24/2014   Lab Results  Component Value Date   TRIG 147.0 10/24/2014   Lab Results  Component Value Date   CHOLHDL 3 10/24/2014     Assessment & Plan  HTN (hypertension) Improved on recheck. Well controlled, no changes to meds. Encouraged heart healthy diet such as the DASH diet and exercise as  tolerated.    OSA (obstructive sleep apnea) Using CPAP routinely, feels well   DM (diabetes mellitus), type 2 hgba1c acceptable, minimize simple carbs. Increase exercise as tolerated. Continue current meds   Hyperlipidemia Tolerating statin, encouraged heart healthy diet, avoid trans fats, minimize simple carbs and saturated fats. Increase exercise as tolerated   Skin lesion of face Recent trip to dermatology found a melanoma on his left arm and a BCC on his cheek   Atypical chest pain Had some episodes of chest tightness in past couple of months, one was during a golf game. patient needs 81 mg Aspirin and fo call if symptoms return. Referred to cardiology

## 2014-11-13 NOTE — Assessment & Plan Note (Signed)
Recent trip to dermatology found a melanoma on his left arm and a BCC on his cheek

## 2014-12-02 ENCOUNTER — Institutional Professional Consult (permissible substitution): Payer: BLUE CROSS/BLUE SHIELD | Admitting: Cardiology

## 2014-12-11 ENCOUNTER — Encounter (HOSPITAL_BASED_OUTPATIENT_CLINIC_OR_DEPARTMENT_OTHER): Payer: BC Managed Care – PPO

## 2014-12-15 ENCOUNTER — Other Ambulatory Visit: Payer: Self-pay | Admitting: Family Medicine

## 2014-12-21 ENCOUNTER — Encounter: Payer: Self-pay | Admitting: Family Medicine

## 2014-12-21 ENCOUNTER — Ambulatory Visit (INDEPENDENT_AMBULATORY_CARE_PROVIDER_SITE_OTHER): Payer: BLUE CROSS/BLUE SHIELD | Admitting: Family Medicine

## 2014-12-21 VITALS — BP 144/87 | HR 65 | Ht 71.0 in | Wt 222.0 lb

## 2014-12-21 DIAGNOSIS — M7661 Achilles tendinitis, right leg: Secondary | ICD-10-CM | POA: Diagnosis not present

## 2014-12-21 NOTE — Patient Instructions (Signed)
You have achilles tendinopathy. Wear heel lifts when up and walking around at least the next 2 weeks. Icing 15 minutes at a time 3-4 times a day. Calf raises on level ground (one or two legged) 3 sets of 10 once a day for next 6 weeks. Ibuprofen 600mg  three times a day with food OR aleve 2 tabs twice a day with food for pain and inflammation (usually for 7-10 days then as needed). Wait 1-2 weeks before returning to running and do so on level ground or a treadmill. Consider nitro patches, physical therapy if not improving. Follow up with me in 2 weeks or as needed.

## 2014-12-22 ENCOUNTER — Ambulatory Visit: Payer: BLUE CROSS/BLUE SHIELD | Admitting: Family Medicine

## 2014-12-27 DIAGNOSIS — M7661 Achilles tendinitis, right leg: Secondary | ICD-10-CM | POA: Insufficient documentation

## 2014-12-27 NOTE — Progress Notes (Signed)
PCP: Penni Homans, MD  Subjective:   HPI: Patient is a 49 y.o. male here for right heel pain.  Patient reports he's running about 3-4 miles 3x/week. Started to get pain and tenderness in achilles area on 3/20. Slight swelling. Has been limping. Taking aleve as needed.  Past Medical History  Diagnosis Date  . Diabetes mellitus without complication   . Hypertension   . Osteopenia   . Testicular cancer   . Allergy-induced asthma   . Obesity, unspecified 06/06/2013  . Nicotine use disorder 06/06/2013    smokeless  . Preventative health care 09/19/2013  . Anxiety state, unspecified 09/19/2013  . Fatty liver disease, nonalcoholic 89/38/1017  . Other and unspecified hyperlipidemia 01/19/2014  . Valvular heart disease 09/18/2014  . Right heart enlargement 09/18/2014  . Gout 09/18/2014  . Atypical chest pain 11/13/2014    Current Outpatient Prescriptions on File Prior to Visit  Medication Sig Dispense Refill  . albuterol (PROVENTIL HFA;VENTOLIN HFA) 108 (90 BASE) MCG/ACT inhaler Inhale 2 puffs into the lungs every 6 (six) hours as needed for wheezing or shortness of breath. 1 Inhaler 2  . allopurinol (ZYLOPRIM) 100 MG tablet TAKE 1 TABLET BY MOUTH EVERY DAY 30 tablet 8  . atorvastatin (LIPITOR) 10 MG tablet Take 1 tablet (10 mg total) by mouth at bedtime. 30 tablet 5  . buPROPion (WELLBUTRIN SR) 150 MG 12 hr tablet TAKE ONE TABLET BY MOUTH TWICE DAILY  60 tablet 4  . Cholecalciferol (VITAMIN D3) 5000 UNITS CAPS Take 1 capsule by mouth daily.    Marland Kitchen glucose blood test strip CHECK BLOOD SUGAR DAILY AND PRN. 100 each 2  . losartan-hydrochlorothiazide (HYZAAR) 100-25 MG per tablet TAKE ONE TABLET BY MOUTH ONE TIME DAILY  30 tablet 6  . meloxicam (MOBIC) 15 MG tablet Take 1 tablet by mouth daily as needed for pain with food. 30 tablet 2  . metFORMIN (GLUCOPHAGE) 1000 MG tablet TAKE 1 TABLET BY MOUTH TWICE DAILY WITH MEALS 60 tablet 8  . Probiotic Product (ALIGN) 4 MG CAPS Take 1 tablet by  mouth.     No current facility-administered medications on file prior to visit.    Past Surgical History  Procedure Laterality Date  . Testicular cancer surgery      No Known Allergies  History   Social History  . Marital Status: Married    Spouse Name: N/A  . Number of Children: 2  . Years of Education: N/A   Occupational History  . finance    Social History Main Topics  . Smoking status: Former Smoker    Types: Cigarettes  . Smokeless tobacco: Former Systems developer    Quit date: 02/28/2014     Comment: 30 years ago socially  . Alcohol Use: 0.0 oz/week    0 Standard drinks or equivalent per week  . Drug Use: No  . Sexual Activity: Yes   Other Topics Concern  . Not on file   Social History Narrative    Family History  Problem Relation Age of Onset  . Diabetes Mother   . Breast cancer Mother   . Bone cancer Mother   . Cancer Mother     breast with bone mets  . Depression Mother   . Hypertension Father   . Prostate cancer Father   . Hyperlipidemia Father   . Cancer Father     prostate  . Heart attack Neg Hx   . Sudden death Neg Hx   . Stroke Paternal Grandfather   .  Prostate cancer Maternal Grandfather   . Hypertension Brother   . Gallstones Brother   . Anxiety disorder Daughter   . ADD / ADHD Daughter   . Bulemia Daughter   . Depression Son   . Anxiety disorder Maternal Aunt     social anxiety, anger    BP 144/87 mmHg  Pulse 65  Ht 5\' 11"  (1.803 m)  Wt 222 lb (100.699 kg)  BMI 30.98 kg/m2  Review of Systems: See HPI above.    Objective:  Physical Exam:  Gen: NAD  Right foot/ankle: Small nodule within achilles proximal to insertion.  No gross deformity, swelling, ecchymoses. FROM with pain on plantarflexion and full dorsiflexion. TTP achilles proximal to insertion.  No other tenderness. Negative ant drawer and talar tilt.   Negative syndesmotic compression. Thompsons test negative. NV intact distally.    Assessment & Plan:  1. Right  achilles tendinopathy - heel lifts, icing.  Shown home exercises to do daily.  NSAIDs for 7-10 days then as needed.  Consider nitro patches, PT if not improving.  F/u in 2 weeks or as needed.

## 2014-12-27 NOTE — Assessment & Plan Note (Signed)
heel lifts, icing.  Shown home exercises to do daily.  NSAIDs for 7-10 days then as needed.  Consider nitro patches, PT if not improving.  F/u in 2 weeks or as needed.

## 2015-01-03 ENCOUNTER — Encounter: Payer: Self-pay | Admitting: Cardiology

## 2015-01-03 ENCOUNTER — Ambulatory Visit (INDEPENDENT_AMBULATORY_CARE_PROVIDER_SITE_OTHER): Payer: BLUE CROSS/BLUE SHIELD | Admitting: Cardiology

## 2015-01-03 VITALS — BP 132/74 | HR 67 | Ht 71.0 in | Wt 232.0 lb

## 2015-01-03 DIAGNOSIS — I10 Essential (primary) hypertension: Secondary | ICD-10-CM

## 2015-01-03 DIAGNOSIS — E669 Obesity, unspecified: Secondary | ICD-10-CM | POA: Diagnosis not present

## 2015-01-03 DIAGNOSIS — E119 Type 2 diabetes mellitus without complications: Secondary | ICD-10-CM | POA: Diagnosis not present

## 2015-01-03 DIAGNOSIS — R079 Chest pain, unspecified: Secondary | ICD-10-CM

## 2015-01-03 NOTE — Progress Notes (Signed)
Cardiology Office Note   Date:  01/03/2015   ID:  Steve Beasley, DOB [birth year], MRN [id number]  PCP:  Penni Homans, MD  Cardiologist:   Candee Furbish, MD       History of Present Illness: Steve Beasley is a 49 y.o. male who presents for evaluation of chest pain. He's had a few episodes of tightness in his chest over the last few months. He noted it once in the hot weather playing golf. No associated shortness of breath bleeding, syncope. He is trying to get back and shape. He is gained approximately 15 pounds over the past year surrounding family stressor, daughter with depression.  +DM, HTN. Quit smokeless tob nicotine. Works for [employer].  ECHO with mildly dilated RV. Normal EF.   Jogging. 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 Paced himself.   2010 had stress test with hypertensive response. Had to stop testing.    Past Medical History  Diagnosis Date  . Diabetes mellitus without complication   . Hypertension   . Osteopenia   . Testicular cancer   . Allergy-induced asthma   . Obesity, unspecified 06/06/2013  . Nicotine use disorder 06/06/2013    smokeless  . Preventative health care 09/19/2013  . Anxiety state, unspecified 09/19/2013  . Fatty liver disease, nonalcoholic 74/08/8785  . Other and unspecified hyperlipidemia 01/19/2014  . Valvular heart disease 09/18/2014  . Right heart enlargement 09/18/2014  . Gout 09/18/2014  . Atypical chest pain 11/13/2014    Past Surgical History  Procedure Laterality Date  . Testicular cancer surgery       Current Outpatient Prescriptions  Medication Sig Dispense Refill  . albuterol (PROVENTIL HFA;VENTOLIN HFA) 108 (90 BASE) MCG/ACT inhaler Inhale 2 puffs into the lungs every 6 (six) hours as needed for wheezing or shortness of breath. 1 Inhaler 2  . allopurinol (ZYLOPRIM) 100 MG tablet TAKE 1 TABLET BY MOUTH EVERY DAY 30 tablet 8  . atorvastatin  (LIPITOR) 10 MG tablet Take 1 tablet (10 mg total) by mouth at bedtime. 30 tablet 5  . buPROPion (WELLBUTRIN SR) 150 MG 12 hr tablet TAKE ONE TABLET BY MOUTH TWICE DAILY  60 tablet 4  . Cholecalciferol (VITAMIN D3) 5000 UNITS CAPS Take 1 capsule by mouth daily.    Marland Kitchen glucose blood test strip CHECK BLOOD SUGAR DAILY AND PRN. 100 each 2  . JUBLIA 10 % SOLN   0  . losartan-hydrochlorothiazide (HYZAAR) 100-25 MG per tablet TAKE ONE TABLET BY MOUTH ONE TIME DAILY  30 tablet 6  . meloxicam (MOBIC) 15 MG tablet Take 1 tablet by mouth daily as needed for pain with food. 30 tablet 2  . metFORMIN (GLUCOPHAGE) 1000 MG tablet TAKE 1 TABLET BY MOUTH TWICE DAILY WITH MEALS 60 tablet 8  . Probiotic Product (ALIGN) 4 MG CAPS Take 1 tablet by mouth.     No current facility-administered medications for this visit.    Allergies:   Review of patient's allergies indicates no known allergies.    Social History:  The patient  reports that he has quit smoking. His smoking use included Cigarettes. He quit smokeless tobacco use about 10 months ago. He reports that he drinks alcohol. He reports that he does not use illicit drugs.   Family History:  The patient's family history includes ADD / ADHD in his daughter; Anxiety disorder in his daughter and maternal aunt; Bone cancer in his mother; Breast  cancer in his mother; Barb Merino in his daughter; Cancer in his father and mother; Depression in his mother and son; Diabetes in his mother; Gallstones in his brother; Hyperlipidemia in his father; Hypertension in his brother and father; Prostate cancer in his father and maternal grandfather; Stroke in his paternal grandfather. There is no history of Heart attack or Sudden death.    ROS:  Please see the history of present illness.   Otherwise, review of systems are positive for snoring, back pain, shortness of breath with activity, chest pain, hearing loss, chest pressure.   All other systems are reviewed and negative.     PHYSICAL EXAM: VS:  BP 132/74 mmHg  Pulse 67  Ht 5\' 11"  (1.803 m)  Wt 232 lb (105.235 kg)  BMI 32.37 kg/m2 , BMI Body mass index is 32.37 kg/(m^2). GEN: Well nourished, well developed, in no acute distress HEENT: normal Neck: no JVD, carotid bruits, or masses Cardiac: RRR; no murmurs, rubs, or gallops,no edema  Respiratory:  clear to auscultation bilaterally, normal work of breathing GI: soft, nontender, nondistended, + BS MS: no deformity or atrophy Skin: warm and dry, no rash Neuro:  Strength and sensation are intact Psych: euthymic mood, full affect   EKG:  EKG is ordered today. The ekg ordered today demonstrates 01/03/15-normal rhythm, 67, nonspecific ST-T wave changes, left axis deviation.   Recent Labs: 10/24/2014: ALT 48; BUN 14; Creatinine 1.21; Hemoglobin 14.4; Platelets 146.0 Repeated and verified X2.*; Potassium 3.5; Sodium 136; TSH 1.88    Lipid Panel    Component Value Date/Time   CHOL 93 10/24/2014 0853   TRIG 147.0 10/24/2014 0853   HDL 31.00* 10/24/2014 0853   CHOLHDL 3 10/24/2014 0853   VLDL 29.4 10/24/2014 0853   LDLCALC 33 10/24/2014 0853      Wt Readings from Last 3 Encounters:  01/03/15 232 lb (105.235 kg)  12/21/14 222 lb (100.699 kg)  11/04/14 230 lb 3.2 oz (104.418 kg)      Other studies Reviewed: Additional studies/ records that were reviewed today include: prior office records.  Review of the above records demonstrates: as above   ASSESSMENT AND PLAN:  1.  Chest pain-with his diabetes, weight gain, hypertension, and chest discomfort, we will go ahead and proceed with nuclear stress test. His symptoms could be anginal equivalent. Also, anxiety could be invoking symptoms as well perhaps musculoskeletal or GI. His EKG shows nonspecific ST changes.  2. Echocardiogram with dilated right ventricle, mild. His pulmonary artery pressures on echocardiogram were normal. He had a normal sleep study. I would think that if he had an undiagnosed  atrial septal defect that especially with his marathon running days, this would've been manifested by now. Echocardiogram has been done.  3. Obesity-continue to encourage weight loss which will help him with his hypertension as well as diabetes.  4. Diabetes-hemoglobin A1c 6.6-6.3.   Current medicines are reviewed at length with the patient today.  The patient does not have concerns regarding medicines.  The following changes have been made:  no change  Labs/ tests ordered today include:  No orders of the defined types were placed in this encounter.     Disposition:   I will follow-up with nuclear stress test.  Signed, Candee Furbish, MD  01/03/2015 3:03 PM    [facility] [address], [place], [place]  [address] Phone: [phone]; Fax: [phone]

## 2015-01-03 NOTE — Patient Instructions (Signed)
The current medical regimen is effective;  continue present plan and medications.  Your physician has requested that you have a lexiscan myoview. For further information please visit [email or web address]. Please follow instruction sheet, as given.  Follow up as needed after testing.  Thank you for choosing [facility]!!

## 2015-01-12 ENCOUNTER — Ambulatory Visit (HOSPITAL_COMMUNITY): Payer: BLUE CROSS/BLUE SHIELD | Attending: Cardiology | Admitting: Radiology

## 2015-01-12 DIAGNOSIS — R079 Chest pain, unspecified: Secondary | ICD-10-CM | POA: Diagnosis not present

## 2015-01-12 DIAGNOSIS — I10 Essential (primary) hypertension: Secondary | ICD-10-CM | POA: Insufficient documentation

## 2015-01-12 DIAGNOSIS — R9431 Abnormal electrocardiogram [ECG] [EKG]: Secondary | ICD-10-CM | POA: Diagnosis present

## 2015-01-12 DIAGNOSIS — E119 Type 2 diabetes mellitus without complications: Secondary | ICD-10-CM | POA: Insufficient documentation

## 2015-01-12 MED ORDER — TECHNETIUM TC 99M SESTAMIBI GENERIC - CARDIOLITE
30.0000 | Freq: Once | INTRAVENOUS | Status: AC | PRN
  Administered 2015-01-12: 30 via INTRAVENOUS

## 2015-01-12 MED ORDER — TECHNETIUM TC 99M SESTAMIBI GENERIC - CARDIOLITE
11.0000 | Freq: Once | INTRAVENOUS | Status: AC | PRN
  Administered 2015-01-12: 11 via INTRAVENOUS

## 2015-01-12 NOTE — Progress Notes (Signed)
[facility] [address] [place], [place] [address] [phone]    Cardiology Nuclear Med Study  LUDIE PAVLIK is a 49 y.o. male     MRN : [id number]     DOB: [birth year]  Procedure Date: 01/12/2015  Nuclear Med Background Indication for Stress Test:  Evaluation for Ischemia and Abnormal EKG History:  MPI ~6 yrs ago, asthma Cardiac Risk Factors: Hypertension and NIDDM  Symptoms:  Chest Pain   Nuclear Pre-Procedure Caffeine/Decaff Intake:  None> 12 hrs NPO After: 7:30pm   Lungs:  clear O2 Sat: 97% on room air. IV 0.9% NS with Angio Cath:  22g  IV Site: L Antecubital x 1, tolerated well IV Started by:  Irven Baltimore, RN  Chest Size (in):  46 Cup Size: n/a  Height: 5\' 11"  (1.803 m)  Weight:  231 lb (104.781 kg)  BMI:  Body mass index is 32.23 kg/(m^2). Tech Comments:  Patient took Hyzaar,and held Metformin this am. Irven Baltimore, RN.    Nuclear Med Study 1 or 2 day study: 1 day  Stress Test Type:  Stress  Reading MD: N/A  Order Authorizing Provider:  Candee Furbish, MD  Resting Radionuclide: Technetium 74m Sestamibi  Resting Radionuclide Dose: 11.0 mCi   Stress Radionuclide:  Technetium 44m Sestamibi  Stress Radionuclide Dose: 33.0 mCi           Stress Protocol Rest HR: 61 Stress HR: 155  Rest BP: 156/85 Stress BP: 210/86  Exercise Time (min): 10:30 METS: 12.3   Predicted Max HR: 171 bpm % Max HR: 90.64 bpm Rate Pressure Product: 32550   Dose of Adenosine (mg):  n/a Dose of Lexiscan: n/a mg  Dose of Atropine (mg): n/a Dose of Dobutamine: n/a mcg/kg/min (at max HR)  Stress Test Technologist: Glade Lloyd, BS-ES  Nuclear Technologist:  Earl Many, CNMT     Rest Procedure:  Myocardial perfusion imaging was performed at rest 45 minutes following the intravenous administration of Technetium 37m Sestamibi. Rest ECG:  SR 61 bpm    Stress Procedure:  The patient exercised on the treadmill utilizing the Bruce Protocol for 10:30 minutes.  The patient stopped due to fatigue and denied any chest pain.  Technetium 73m Sestamibi was injected at peak exercise and myocardial perfusion imaging was performed after a brief delay. Stress ECG: No significant ST segment change suggestive of ischemia.  QPS Raw Data Images:  Normal; no motion artifact; normal heart/lung ratio. Stress Images:  Normal homogeneous uptake in all areas of the myocardium. Rest Images:  Normal homogeneous uptake in all areas of the myocardium. Subtraction (SDS):  No evidence of ischemia. Transient Ischemic Dilatation (Normal <1.22):  0.88 Lung/Heart Ratio (Normal <0.45):  0.38  Quantitative Gated Spect Images QGS EDV:  110 ml QGS ESV:  46 ml  Impression Exercise Capacity:  Excellent exercise capacity. BP Response:  Normal blood pressure response. Clinical Symptoms:  No significant symptoms noted. ECG Impression:  No significant ST segment change suggestive of ischemia.  3 beats NST at near peak exercises  X 2   Comparison with Prior Nuclear Study: No prior study to compare    Overall Impression:  Normal perfusion.    LV Ejection Fraction: 58%.  LV Wall Motion:  NL LV Function; NL Wall Motion   Steve Beasley

## 2015-02-10 ENCOUNTER — Other Ambulatory Visit (INDEPENDENT_AMBULATORY_CARE_PROVIDER_SITE_OTHER): Payer: BLUE CROSS/BLUE SHIELD

## 2015-02-10 DIAGNOSIS — E785 Hyperlipidemia, unspecified: Secondary | ICD-10-CM

## 2015-02-10 DIAGNOSIS — R739 Hyperglycemia, unspecified: Secondary | ICD-10-CM | POA: Diagnosis not present

## 2015-02-10 DIAGNOSIS — R0789 Other chest pain: Secondary | ICD-10-CM

## 2015-02-10 DIAGNOSIS — I10 Essential (primary) hypertension: Secondary | ICD-10-CM

## 2015-02-10 LAB — CBC
HCT: 43.7 % (ref 39.0–52.0)
Hemoglobin: 14.9 g/dL (ref 13.0–17.0)
MCHC: 34 g/dL (ref 30.0–36.0)
MCV: 86.3 fl (ref 78.0–100.0)
PLATELETS: 151 10*3/uL (ref 150.0–400.0)
RBC: 5.06 Mil/uL (ref 4.22–5.81)
RDW: 13.1 % (ref 11.5–15.5)

## 2015-02-10 LAB — COMPREHENSIVE METABOLIC PANEL
ALT: 48 U/L (ref 0–53)
AST: 30 U/L (ref 0–37)
Albumin: 4 g/dL (ref 3.5–5.2)
Alkaline Phosphatase: 86 U/L (ref 39–117)
BILIRUBIN TOTAL: 0.9 mg/dL (ref 0.2–1.2)
BUN: 19 mg/dL (ref 6–23)
CALCIUM: 10.5 mg/dL (ref 8.4–10.5)
CO2: 28 meq/L (ref 19–32)
Chloride: 102 mEq/L (ref 96–112)
Creatinine, Ser: 1.3 mg/dL (ref 0.40–1.50)
GFR: 62.14 mL/min (ref >60.00)
Glucose, Bld: 148 mg/dL — ABNORMAL HIGH (ref 70–99)
Potassium: 4.4 mEq/L (ref 3.5–5.1)
Sodium: 136 mEq/L (ref 135–145)
TOTAL PROTEIN: 6.9 g/dL (ref 6.0–8.3)

## 2015-02-10 LAB — TSH: TSH: 2.06 u[IU]/mL (ref 0.35–4.50)

## 2015-02-10 LAB — LIPID PANEL
CHOL/HDL RATIO: 4
Cholesterol: 127 mg/dL (ref 0–200)
HDL: 33 mg/dL — ABNORMAL LOW (ref >39.00)
NonHDL: 94
Triglycerides: 219 mg/dL — ABNORMAL HIGH (ref 0.0–149.0)
VLDL: 43.8 mg/dL — AB (ref 0.0–40.0)

## 2015-02-10 LAB — LDL CHOLESTEROL, DIRECT: Direct LDL: 71 mg/dL

## 2015-02-10 LAB — HEMOGLOBIN A1C: Hgb A1c MFr Bld: 6.5 % (ref 4.6–6.5)

## 2015-02-16 ENCOUNTER — Encounter: Payer: Self-pay | Admitting: Family Medicine

## 2015-02-16 ENCOUNTER — Ambulatory Visit (INDEPENDENT_AMBULATORY_CARE_PROVIDER_SITE_OTHER): Payer: BLUE CROSS/BLUE SHIELD | Admitting: Family Medicine

## 2015-02-16 VITALS — BP 128/78 | HR 64 | Temp 97.8°F | Resp 16 | Wt 229.0 lb

## 2015-02-16 DIAGNOSIS — Z0000 Encounter for general adult medical examination without abnormal findings: Secondary | ICD-10-CM

## 2015-02-16 DIAGNOSIS — E785 Hyperlipidemia, unspecified: Secondary | ICD-10-CM

## 2015-02-16 DIAGNOSIS — E119 Type 2 diabetes mellitus without complications: Secondary | ICD-10-CM | POA: Diagnosis not present

## 2015-02-16 DIAGNOSIS — M109 Gout, unspecified: Secondary | ICD-10-CM

## 2015-02-16 DIAGNOSIS — E669 Obesity, unspecified: Secondary | ICD-10-CM

## 2015-02-16 DIAGNOSIS — C4490 Unspecified malignant neoplasm of skin, unspecified: Secondary | ICD-10-CM

## 2015-02-16 DIAGNOSIS — I10 Essential (primary) hypertension: Secondary | ICD-10-CM

## 2015-02-16 DIAGNOSIS — M10079 Idiopathic gout, unspecified ankle and foot: Secondary | ICD-10-CM | POA: Diagnosis not present

## 2015-02-16 DIAGNOSIS — E559 Vitamin D deficiency, unspecified: Secondary | ICD-10-CM

## 2015-02-16 MED ORDER — LOSARTAN POTASSIUM-HCTZ 100-25 MG PO TABS: 1.0000 | ORAL_TABLET | Freq: Every day | ORAL | Status: DC

## 2015-02-16 MED ORDER — ALLOPURINOL 100 MG PO TABS: 100.0000 mg | ORAL_TABLET | Freq: Every day | ORAL | Status: DC

## 2015-02-16 MED ORDER — ATORVASTATIN CALCIUM 10 MG PO TABS: 10.0000 mg | ORAL_TABLET | Freq: Every day | ORAL | Status: DC

## 2015-02-16 MED ORDER — BUPROPION HCL ER (SR) 150 MG PO TB12: 150.0000 mg | ORAL_TABLET | Freq: Two times a day (BID) | ORAL | Status: DC

## 2015-02-16 MED ORDER — METFORMIN HCL 1000 MG PO TABS: 1000.0000 mg | ORAL_TABLET | Freq: Two times a day (BID) | ORAL | Status: DC

## 2015-02-16 NOTE — Patient Instructions (Signed)
Salon Pas gel  Twice daily  Basic Carbohydrate Counting for Diabetes Mellitus Carbohydrate counting is a method for keeping track of the amount of carbohydrates you eat. Eating carbohydrates naturally increases the level of sugar (glucose) in your blood, so it is important for you to know the amount that is okay for you to have in every meal. Carbohydrate counting helps keep the level of glucose in your blood within normal limits. The amount of carbohydrates allowed is different for every person. A dietitian can help you calculate the amount that is right for you. Once you know the amount of carbohydrates you can have, you can count the carbohydrates in the foods you want to eat. Carbohydrates are found in the following foods:  Grains, such as breads and cereals.  Dried beans and soy products.  Starchy vegetables, such as potatoes, peas, and corn.  Fruit and fruit juices.  Milk and yogurt.  Sweets and snack foods, such as cake, cookies, candy, chips, soft drinks, and fruit drinks. CARBOHYDRATE COUNTING There are two ways to count the carbohydrates in your food. You can use either of the methods or a combination of both. Reading the "Nutrition Facts" on [place] The "Nutrition Facts" is an area that is included on the labels of almost all packaged food and beverages in the [place]. It includes the serving size of that food or beverage and information about the nutrients in each serving of the food, including the grams (g) of carbohydrate per serving.  Decide the number of servings of this food or beverage that you will be able to eat or drink. Multiply that number of servings by the number of grams of carbohydrate that is listed on the label for that serving. The total will be the amount of carbohydrates you will be having when you eat or drink this food or beverage. Learning Standard Serving Sizes of Food When you eat food that is not packaged or does not include "Nutrition Facts"  on the label, you need to measure the servings in order to count the amount of carbohydrates.A serving of most carbohydrate-rich foods contains about 15 g of carbohydrates. The following list includes serving sizes of carbohydrate-rich foods that provide 15 g ofcarbohydrate per serving:   1 slice of bread (1 oz) or 1 six-inch tortilla.    of a hamburger bun or English muffin.  4-6 crackers.   cup unsweetened dry cereal.    cup hot cereal.   cup rice or pasta.    cup mashed potatoes or  of a large baked potato.  1 cup fresh fruit or one small piece of fruit.    cup canned or frozen fruit or fruit juice.  1 cup milk.   cup plain fat-free yogurt or yogurt sweetened with artificial sweeteners.   cup cooked dried beans or starchy vegetable, such as peas, corn, or potatoes.  Decide the number of standard-size servings that you will eat. Multiply that number of servings by 15 (the grams of carbohydrates in that serving). For example, if you eat 2 cups of strawberries, you will have eaten 2 servings and 30 g of carbohydrates (2 servings x 15 g = 30 g). For foods such as soups and casseroles, in which more than one food is mixed in, you will need to count the carbohydrates in each food that is included. EXAMPLE OF CARBOHYDRATE COUNTING Sample Dinner  3 oz chicken breast.   cup of brown rice.   cup of corn.  1 cup  milk.   1 cup strawberries with sugar-free whipped topping.  Carbohydrate Calculation Step 1: Identify the foods that contain carbohydrates:   Rice.   Corn.   Milk.   Strawberries. Step 2:Calculate the number of servings eaten of each:   2 servings of rice.   1 serving of corn.   1 serving of milk.   1 serving of strawberries. Step 3: Multiply each of those number of servings by 15 g:   2 servings of rice x 15 g = 30 g.   1 serving of corn x 15 g = 15 g.   1 serving of milk x 15 g = 15 g.   1 serving of strawberries x 15  g = 15 g. Step 4: Add together all of the amounts to find the total grams of carbohydrates eaten: 30 g + 15 g + 15 g + 15 g = 75 g. Document Released: 09/16/2005 Document Revised: 01/31/2014 Document Reviewed: 08/13/2013 [facility] Patient Information 2015 [place], [place]. This information is not intended to replace advice given to you by your health care provider. Make sure you discuss any questions you have with your health care provider.

## 2015-02-16 NOTE — Progress Notes (Signed)
Steve Beasley  [id number] 16-Apr-1966 02/16/2015      Progress Note-Follow Up  Subjective  Chief Complaint  Chief Complaint  Patient presents with  . Follow-up    Pt here to f/u on heart study    HPI  Patient is a 49 y.o. male in today for routine medical care. Patient is in today for follow-up been feeling well. He notes his gout is normal controlled since he started allopurinol. He denies any foot pain. Does have some stiffness and pain in his shoulders at times but no recent injury or trauma. No recent illness or fevers. Denies CP/palp/SOB/HA/congestion/fevers/GI or GU c/o. Taking meds as prescribed. Has been tryin got maintain a heart healthy diet.   Past Medical History  Diagnosis Date  . Diabetes mellitus without complication   . Hypertension   . Osteopenia   . Testicular cancer   . Allergy-induced asthma   . Obesity, unspecified 06/06/2013  . Nicotine use disorder 06/06/2013    smokeless  . Preventative health care 09/19/2013  . Anxiety state, unspecified 09/19/2013  . Fatty liver disease, nonalcoholic 79/89/2119  . Other and unspecified hyperlipidemia 01/19/2014  . Valvular heart disease 09/18/2014  . Right heart enlargement 09/18/2014  . Gout 09/18/2014  . Atypical chest pain 11/13/2014    Past Surgical History  Procedure Laterality Date  . Testicular cancer surgery    . Basal cell carcinoma excision      Within 6 months  . Skin cancer excision      Within 6 months    Family History  Problem Relation Age of Onset  . Diabetes Mother   . Breast cancer Mother   . Bone cancer Mother   . Cancer Mother     breast with bone mets  . Depression Mother   . Hypertension Father   . Prostate cancer Father   . Hyperlipidemia Father   . Cancer Father     prostate  . Heart attack Neg Hx   . Sudden death Neg Hx   . Stroke Paternal Grandfather   . Prostate cancer Maternal Grandfather   . Hypertension Brother   . Gallstones Brother   . Anxiety disorder Daughter    . ADD / ADHD Daughter   . Bulemia Daughter   . Depression Son   . Anxiety disorder Maternal Aunt     social anxiety, anger    History   Social History  . Marital Status: Married    Spouse Name: N/A  . Number of Children: 2  . Years of Education: N/A   Occupational History  . finance    Social History Main Topics  . Smoking status: Former Smoker    Types: Cigarettes  . Smokeless tobacco: Former Systems developer    Quit date: 02/28/2014     Comment: 30 years ago socially  . Alcohol Use: 0.0 oz/week    0 Standard drinks or equivalent per week  . Drug Use: No  . Sexual Activity: Yes   Other Topics Concern  . Not on file   Social History Narrative    Current Outpatient Prescriptions on File Prior to Visit  Medication Sig Dispense Refill  . allopurinol (ZYLOPRIM) 100 MG tablet TAKE 1 TABLET BY MOUTH EVERY DAY 30 tablet 8  . atorvastatin (LIPITOR) 10 MG tablet Take 1 tablet (10 mg total) by mouth at bedtime. 30 tablet 5  . buPROPion (WELLBUTRIN SR) 150 MG 12 hr tablet TAKE ONE TABLET BY MOUTH TWICE DAILY  60 tablet 4  .  Cholecalciferol (VITAMIN D3) 5000 UNITS CAPS Take 1 capsule by mouth daily.    Marland Kitchen glucose blood test strip CHECK BLOOD SUGAR DAILY AND PRN. 100 each 2  . JUBLIA 10 % SOLN   0  . losartan-hydrochlorothiazide (HYZAAR) 100-25 MG per tablet TAKE ONE TABLET BY MOUTH ONE TIME DAILY  30 tablet 6  . metFORMIN (GLUCOPHAGE) 1000 MG tablet TAKE 1 TABLET BY MOUTH TWICE DAILY WITH MEALS 60 tablet 8  . Probiotic Product (ALIGN) 4 MG CAPS Take 1 tablet by mouth.    Marland Kitchen albuterol (PROVENTIL HFA;VENTOLIN HFA) 108 (90 BASE) MCG/ACT inhaler Inhale 2 puffs into the lungs every 6 (six) hours as needed for wheezing or shortness of breath. (Patient not taking: Reported on 02/16/2015) 1 Inhaler 2  . meloxicam (MOBIC) 15 MG tablet Take 1 tablet by mouth daily as needed for pain with food. (Patient not taking: Reported on 02/16/2015) 30 tablet 2   No current facility-administered medications on file  prior to visit.    No Known Allergies  Review of Systems  Review of Systems  Constitutional: Negative for fever and malaise/fatigue.  HENT: Negative for congestion.   Eyes: Negative for discharge.  Respiratory: Negative for shortness of breath.   Cardiovascular: Negative for chest pain, palpitations and leg swelling.  Gastrointestinal: Negative for nausea, abdominal pain and diarrhea.  Genitourinary: Negative for dysuria.  Musculoskeletal: Negative for falls.  Skin: Negative for rash.  Neurological: Negative for loss of consciousness and headaches.  Endo/Heme/Allergies: Negative for polydipsia.  Psychiatric/Behavioral: Negative for depression and suicidal ideas. The patient is not nervous/anxious and does not have insomnia.     Objective  BP 128/78 mmHg  Pulse 64  Temp(Src) 97.8 F (36.6 C) (Oral)  Resp 16  Wt 229 lb (103.874 kg)  SpO2 98%  Physical Exam  Physical Exam  Constitutional: He is oriented to person, place, and time and well-developed, well-nourished, and in no distress. No distress.  HENT:  Head: Normocephalic and atraumatic.  Eyes: Conjunctivae are normal.  Neck: Neck supple. No thyromegaly present.  Cardiovascular: Normal rate, regular rhythm and normal heart sounds.   No murmur heard. Pulmonary/Chest: Effort normal and breath sounds normal. No respiratory distress.  Abdominal: He exhibits no distension and no mass. There is no tenderness.  Musculoskeletal: He exhibits no edema.  Neurological: He is alert and oriented to person, place, and time.  Skin: Skin is warm.  Psychiatric: Memory, affect and judgment normal.    Lab Results  Component Value Date   TSH 2.06 02/10/2015   Lab Results  Component Value Date   WBC ----- 02/10/2015   HGB 14.9 02/10/2015   HCT 43.7 02/10/2015   MCV 86.3 02/10/2015   PLT 151.0 02/10/2015   Lab Results  Component Value Date   CREATININE 1.30 02/10/2015   BUN 19 02/10/2015   NA 136 02/10/2015   K 4.4  02/10/2015   CL 102 02/10/2015   CO2 28 02/10/2015   Lab Results  Component Value Date   ALT 48 02/10/2015   AST 30 02/10/2015   ALKPHOS 86 02/10/2015   BILITOT 0.9 02/10/2015   Lab Results  Component Value Date   CHOL 127 02/10/2015   Lab Results  Component Value Date   HDL 33.00* 02/10/2015   Lab Results  Component Value Date   LDLCALC 33 10/24/2014   Lab Results  Component Value Date   TRIG 219.0* 02/10/2015   Lab Results  Component Value Date   CHOLHDL 4 02/10/2015  Assessment & Plan  HTN (hypertension) Well controlled, no changes to meds. Encouraged heart healthy diet such as the DASH diet and exercise as tolerated.    Gout Well controlled on current meds, no painful flares with allopurinol   Obesity Encouraged DASH diet, decrease po intake and increase exercise as tolerated. Needs 7-8 hours of sleep nightly. Avoid trans fats, eat small, frequent meals every 4-5 hours with lean proteins, complex carbs and healthy fats. Minimize simple carbs   Hyperlipidemia Tolerating statin, encouraged heart healthy diet, avoid trans fats, minimize simple carbs and saturated fats. Increase exercise as tolerated   DM (diabetes mellitus), type 2 hgba1c acceptable, minimize simple carbs. Increase exercise as tolerated. Continue current meds    b

## 2015-02-16 NOTE — Progress Notes (Signed)
Pre visit review using our clinic review tool, if applicable. No additional management support is needed unless otherwise documented below in the visit note. 

## 2015-02-27 DIAGNOSIS — C4490 Unspecified malignant neoplasm of skin, unspecified: Secondary | ICD-10-CM | POA: Insufficient documentation

## 2015-02-27 NOTE — Assessment & Plan Note (Signed)
>>  ASSESSMENT AND PLAN FOR HTN (HYPERTENSION) WRITTEN ON 02/27/2015 10:14 AM BY BLYTH, STACEY A, MD  Well controlled, no changes to meds. Encouraged heart healthy diet such as the DASH diet and exercise as tolerated.

## 2015-02-27 NOTE — Assessment & Plan Note (Signed)
hgba1c acceptable, minimize simple carbs. Increase exercise as tolerated. Continue current meds 

## 2015-02-27 NOTE — Assessment & Plan Note (Signed)
Tolerating statin, encouraged heart healthy diet, avoid trans fats, minimize simple carbs and saturated fats. Increase exercise as tolerated 

## 2015-02-27 NOTE — Assessment & Plan Note (Signed)
Encouraged DASH diet, decrease po intake and increase exercise as tolerated. Needs 7-8 hours of sleep nightly. Avoid trans fats, eat small, frequent meals every 4-5 hours with lean proteins, complex carbs and healthy fats. Minimize simple carbs 

## 2015-02-27 NOTE — Assessment & Plan Note (Signed)
Well controlled on current meds, no painful flares with allopurinol

## 2015-02-27 NOTE — Assessment & Plan Note (Signed)
Well controlled, no changes to meds. Encouraged heart healthy diet such as the DASH diet and exercise as tolerated.  °

## 2015-03-09 ENCOUNTER — Encounter: Payer: Self-pay | Admitting: Nurse Practitioner

## 2015-03-09 ENCOUNTER — Ambulatory Visit (INDEPENDENT_AMBULATORY_CARE_PROVIDER_SITE_OTHER): Payer: BLUE CROSS/BLUE SHIELD | Admitting: Nurse Practitioner

## 2015-03-09 ENCOUNTER — Telehealth: Payer: Self-pay | Admitting: Family Medicine

## 2015-03-09 VITALS — BP 143/81 | HR 63 | Temp 98.2°F | Resp 18 | Ht 71.0 in | Wt 230.0 lb

## 2015-03-09 DIAGNOSIS — J069 Acute upper respiratory infection, unspecified: Secondary | ICD-10-CM

## 2015-03-09 MED ORDER — BENZONATATE 200 MG PO CAPS: 200.0000 mg | ORAL_CAPSULE | Freq: Three times a day (TID) | ORAL | Status: DC | PRN

## 2015-03-09 MED ORDER — HYDROCODONE-HOMATROPINE 5-1.5 MG/5ML PO SYRP: 5.0000 mL | ORAL_SOLUTION | Freq: Every evening | ORAL | Status: DC | PRN

## 2015-03-09 NOTE — Progress Notes (Signed)
Pre visit review using our clinic review tool, if applicable. No additional management support is needed unless otherwise documented below in the visit note. 

## 2015-03-09 NOTE — Progress Notes (Signed)
   Subjective:    Patient ID: Steve Beasley, male    DOB: 17-Mar-1966, 49 y.o.   MRN: [id number]  Cough This is a new problem. The current episode started 1 to 4 weeks ago (2 wks). The problem has been waxing and waning. The problem occurs every few minutes. The cough is non-productive. Associated symptoms include nasal congestion and postnasal drip. Pertinent negatives include no chest pain, chills, ear congestion, fever, headaches, myalgias, sore throat, shortness of breath or wheezing. The symptoms are aggravated by lying down. He has tried OTC cough suppressant (delsym, coricidn) for the symptoms. The treatment provided no relief.      Review of Systems  Constitutional: Negative for fever and chills.  HENT: Positive for postnasal drip. Negative for sore throat.   Respiratory: Positive for cough. Negative for shortness of breath and wheezing.   Cardiovascular: Negative for chest pain.  Musculoskeletal: Negative for myalgias.  Neurological: Negative for headaches.       Objective:   Physical Exam  Constitutional: He is oriented to person, place, and time. He appears well-developed and well-nourished.  HENT:  Head: Normocephalic and atraumatic.  Right Ear: External ear normal.  Left Ear: External ear normal.  Mouth/Throat: Oropharynx is clear and moist. No oropharyngeal exudate.  Eyes: Conjunctivae are normal. Right eye exhibits no discharge. Left eye exhibits no discharge.  Neck: Normal range of motion. No thyromegaly present.  Cardiovascular: Normal rate, regular rhythm and normal heart sounds.   No murmur heard. Pulmonary/Chest: Effort normal and breath sounds normal.  freq coughing during exam  Lymphadenopathy:    He has no cervical adenopathy.  Neurological: He is alert and oriented to person, place, and time.  Skin: Skin is warm and dry.  Psychiatric: He has a normal mood and affect. His behavior is normal. Thought content normal.  Vitals reviewed.           Assessment & Plan:  1. Upper respiratory infection with cough and congestion Likely viral - HYDROcodone-homatropine (HYCODAN) 5-1.5 MG/5ML syrup; Take 5 mLs by mouth at bedtime as needed for cough.  Dispense: 120 mL; Refill: 0 - benzonatate (TESSALON) 200 MG capsule; Take 1 capsule (200 mg total) by mouth 3 (three) times daily as needed for cough.  Dispense: 60 capsule; Refill: 0 Start daily sinus rinse F/u PRN

## 2015-03-09 NOTE — Telephone Encounter (Signed)
error 

## 2015-03-09 NOTE — Patient Instructions (Signed)
You have a virus causing your symptoms. The average duration of cold symptoms is 14 days. Cough can last 3-4 weeks. For cough & nasal congestion: Start daily sinus rinses (neilmed Sinus Rinse). Use daily for about 1 week, longer if still coughing. Use benzonatate capsules during day for cough. Use cough syrup at night.  Sip fluids every hour. Rest. If you are not feeling better in 10 days or develop fever or chest pain, call us for re-evaluation. Feel better!  Upper Respiratory Infection, Adult An upper respiratory infection (URI) is also sometimes known as the common cold. The upper respiratory tract includes the nose, sinuses, throat, trachea, and bronchi. Bronchi are the airways leading to the lungs. Most people improve within 1 week, but symptoms can last up to 2 weeks. A residual cough may last even longer.  CAUSES Many different viruses can infect the tissues lining the upper respiratory tract. The tissues become irritated and inflamed and often become very moist. Mucus production is also common. A cold is contagious. You can easily spread the virus to others by oral contact. This includes kissing, sharing a glass, coughing, or sneezing. Touching your mouth or nose and then touching a surface, which is then touched by another person, can also spread the virus. SYMPTOMS  Symptoms typically develop 1 to 3 days after you come in contact with a cold virus. Symptoms vary from person to person. They may include:  Runny nose.  Sneezing.  Nasal congestion.  Sinus irritation.  Sore throat.  Loss of voice (laryngitis).  Cough.  Fatigue.  Muscle aches.  Loss of appetite.  Headache.  Low-grade fever. DIAGNOSIS  You might diagnose your own cold based on familiar symptoms, since most people get a cold 2 to 3 times a year. Your caregiver can confirm this based on your exam. Most importantly, your caregiver can check that your symptoms are not due to another disease such as strep throat,  sinusitis, pneumonia, asthma, or epiglottitis. Blood tests, throat tests, and X-rays are not necessary to diagnose a common cold, but they may sometimes be helpful in excluding other more serious diseases. Your caregiver will decide if any further tests are required. RISKS AND COMPLICATIONS  You may be at risk for a more severe case of the common cold if you smoke cigarettes, have chronic heart disease (such as heart failure) or lung disease (such as asthma), or if you have a weakened immune system. The very young and very old are also at risk for more serious infections. Bacterial sinusitis, middle ear infections, and bacterial pneumonia can complicate the common cold. The common cold can worsen asthma and chronic obstructive pulmonary disease (COPD). Sometimes, these complications can require emergency medical care and may be life-threatening. PREVENTION  The best way to protect against getting a cold is to practice good hygiene. Avoid oral or hand contact with people with cold symptoms. Wash your hands often if contact occurs. There is no clear evidence that vitamin C, vitamin E, echinacea, or exercise reduces the chance of developing a cold. However, it is always recommended to get plenty of rest and practice good nutrition. TREATMENT  Treatment is directed at relieving symptoms. There is no cure. Antibiotics are not effective, because the infection is caused by a virus, not by bacteria. Treatment may include:  Increased fluid intake. Sports drinks offer valuable electrolytes, sugars, and fluids.  Breathing heated mist or steam (vaporizer or shower).  Eating chicken soup or other clear broths, and maintaining good nutrition.  Getting  plenty of rest.  Using gargles or lozenges for comfort.  Controlling fevers with ibuprofen or acetaminophen as directed by your caregiver.  Increasing usage of your inhaler if you have asthma. Zinc gel and zinc lozenges, taken in the first 24 hours of the common  cold, can shorten the duration and lessen the severity of symptoms. Pain medicines may help with fever, muscle aches, and throat pain. A variety of non-prescription medicines are available to treat congestion and runny nose. Your caregiver can make recommendations and may suggest nasal or lung inhalers for other symptoms.  HOME CARE INSTRUCTIONS   Only take over-the-counter or prescription medicines for pain, discomfort, or fever as directed by your caregiver.  Use a warm mist humidifier or inhale steam from a shower to increase air moisture. This may keep secretions moist and make it easier to breathe.  Drink enough water and fluids to keep your urine clear or pale yellow.  Rest as needed.  Return to work when your temperature has returned to normal or as your caregiver advises. You may need to stay home longer to avoid infecting others. You can also use a face mask and careful hand washing to prevent spread of the virus. SEEK MEDICAL CARE IF:   After the first few days, you feel you are getting worse rather than better.  You need your caregiver's advice about medicines to control symptoms.  You develop chills, worsening shortness of breath, or brown or red sputum. These may be signs of pneumonia.  You develop yellow or brown nasal discharge or pain in the face, especially when you bend forward. These may be signs of sinusitis.  You develop a fever, swollen neck glands, pain with swallowing, or white areas in the back of your throat. These may be signs of strep throat. SEEK IMMEDIATE MEDICAL CARE IF:   You have a fever.  You develop severe or persistent headache, ear pain, sinus pain, or chest pain.  You develop wheezing, a prolonged cough, cough up blood, or have a change in your usual mucus (if you have chronic lung disease).  You develop sore muscles or a stiff neck. Document Released: 03/12/2001 Document Revised: 12/09/2011 Document Reviewed: 01/18/2011 [facility] Patient  Information 2014 [place], [place].

## 2015-03-09 NOTE — Telephone Encounter (Signed)
Pre Visit letter sent  °

## 2015-03-13 ENCOUNTER — Telehealth: Payer: Self-pay | Admitting: Family Medicine

## 2015-03-13 DIAGNOSIS — M1000 Idiopathic gout, unspecified site: Secondary | ICD-10-CM

## 2015-03-13 MED ORDER — ALLOPURINOL 100 MG PO TABS: 100.0000 mg | ORAL_TABLET | Freq: Two times a day (BID) | ORAL | Status: DC

## 2015-03-13 NOTE — Telephone Encounter (Signed)
So is it just not working or is he having a side effect? If he is able have him come in for a uric acid test in the blood work and then we can adjust dosing, I normally will just increase the Allopurinol dose if they are not having side effects.

## 2015-03-13 NOTE — Telephone Encounter (Signed)
Caller name: Daijon Wenke. Relation to MT:NZDK Call back number: [phone] Pharmacy:  Reason for call: Pt called stating take meds for Gout called  allopurinol (ZYLOPRIM) 100 MG tablet, pt mentioned not doing good with that and is wanting to know if needed to schedule an appt or can have another med that can help him with the gout. Please advise.

## 2015-03-13 NOTE — Telephone Encounter (Signed)
No side effects at all. Has recently done some running and had an attack in his left foot.  Did give info. To PCP and agreed to increase allopurinol to 200 mg, scheduled uric acid lab for Wednesday of this week.

## 2015-03-15 ENCOUNTER — Other Ambulatory Visit (INDEPENDENT_AMBULATORY_CARE_PROVIDER_SITE_OTHER): Payer: BLUE CROSS/BLUE SHIELD

## 2015-03-15 ENCOUNTER — Telehealth: Payer: Self-pay

## 2015-03-15 DIAGNOSIS — M10079 Idiopathic gout, unspecified ankle and foot: Secondary | ICD-10-CM

## 2015-03-15 DIAGNOSIS — M109 Gout, unspecified: Secondary | ICD-10-CM

## 2015-03-15 DIAGNOSIS — E119 Type 2 diabetes mellitus without complications: Secondary | ICD-10-CM

## 2015-03-15 LAB — URIC ACID: URIC ACID, SERUM: 6.2 mg/dL (ref 4.0–7.8)

## 2015-03-15 NOTE — Telephone Encounter (Signed)
Pt notified and verbalized understanding. No concerns at this time.

## 2015-03-15 NOTE — Telephone Encounter (Signed)
-----   Message from Mosie Lukes, MD sent at 03/15/2015  1:11 PM EDT ----- Uric acid actually normal, may continue the increased Allopurinol and see if it helps but according to these numbers can also drop back down to 100mg  and if foot keeps hurting may need a sports med consult.

## 2015-04-12 ENCOUNTER — Telehealth: Payer: Self-pay | Admitting: Family Medicine

## 2015-04-12 NOTE — Telephone Encounter (Signed)
Caller name: Jamesen Relation to pt: self Call back number: [phone] Pharmacy: [facility] in [place], [place]. P: [phone]  Reason for call:   Patient states that he is having a gout flare up and is requesting that colchicine be called in. He is out of town for his daughters wedding.

## 2015-04-13 MED ORDER — COLCHICINE 0.6 MG PO TABS: ORAL_TABLET | ORAL | Status: DC

## 2015-04-13 NOTE — Telephone Encounter (Signed)
OK to give him rx for Colchicine 0.6 mg, 2 tabs po once then repeat 1 tab po q 2 hours til pain relief, intolerable diarrhea or max of 6 tabs in 24 hours. Disp #6 with 1 rf

## 2015-04-13 NOTE — Telephone Encounter (Signed)
Patient returned phone call and informed him that rx has been sent.

## 2015-04-13 NOTE — Telephone Encounter (Signed)
Patient takes allopurinol 100mg  BID, I do not see where he has taken colchicine before.  Please advise.

## 2015-04-13 NOTE — Telephone Encounter (Signed)
Printed prescription as instructed and will fax to [facility] in [place] as requested. Did call the patient to inform, but had to leave a message to call back

## 2015-08-11 ENCOUNTER — Telehealth: Payer: Self-pay | Admitting: Family Medicine

## 2015-08-11 ENCOUNTER — Other Ambulatory Visit (INDEPENDENT_AMBULATORY_CARE_PROVIDER_SITE_OTHER): Payer: BLUE CROSS/BLUE SHIELD

## 2015-08-11 DIAGNOSIS — E119 Type 2 diabetes mellitus without complications: Secondary | ICD-10-CM

## 2015-08-11 DIAGNOSIS — E559 Vitamin D deficiency, unspecified: Secondary | ICD-10-CM

## 2015-08-11 DIAGNOSIS — I10 Essential (primary) hypertension: Secondary | ICD-10-CM

## 2015-08-11 DIAGNOSIS — E785 Hyperlipidemia, unspecified: Secondary | ICD-10-CM

## 2015-08-11 LAB — COMPREHENSIVE METABOLIC PANEL
ALK PHOS: 89 U/L (ref 40–115)
ALT: 70 U/L — AB (ref 9–46)
AST: 41 U/L — AB (ref 10–35)
Albumin: 4.4 g/dL (ref 3.6–5.1)
BILIRUBIN TOTAL: 1.7 mg/dL — AB (ref 0.2–1.2)
BUN: 12 mg/dL (ref 7–25)
CO2: 27 mmol/L (ref 20–31)
CREATININE: 1.14 mg/dL (ref 0.70–1.33)
Calcium: 10.4 mg/dL — ABNORMAL HIGH (ref 8.6–10.3)
Chloride: 105 mmol/L (ref 98–110)
GLUCOSE: 108 mg/dL — AB (ref 65–99)
POTASSIUM: 4 mmol/L (ref 3.5–5.3)
Sodium: 144 mmol/L (ref 135–146)
TOTAL PROTEIN: 7 g/dL (ref 6.1–8.1)

## 2015-08-11 LAB — CBC WITH DIFFERENTIAL/PLATELET
BASOS ABS: 0 10*3/uL (ref 0.0–0.1)
Basophils Relative: 0 % (ref 0–1)
EOS ABS: 0 10*3/uL (ref 0.0–0.7)
Eosinophils Relative: 0 % (ref 0–5)
HEMATOCRIT: 43.1 % (ref 39.0–52.0)
HEMOGLOBIN: 14.4 g/dL (ref 13.0–17.0)
LYMPHS ABS: 2.8 10*3/uL (ref 0.7–4.0)
Lymphocytes Relative: 30 % (ref 12–46)
MCH: 29.1 pg (ref 26.0–34.0)
MCHC: 33.4 g/dL (ref 30.0–36.0)
MCV: 87.1 fL (ref 78.0–100.0)
MONOS PCT: 6 % (ref 3–12)
MPV: 11 fL (ref 8.6–12.4)
Monocytes Absolute: 0.6 10*3/uL (ref 0.1–1.0)
NEUTROS PCT: 64 % (ref 43–77)
Neutro Abs: 5.9 10*3/uL (ref 1.7–7.7)
PLATELETS: 174 10*3/uL (ref 150–400)
RBC: 4.95 MIL/uL (ref 4.22–5.81)
RDW: 13.4 % (ref 11.5–15.5)
WBC: 9.2 10*3/uL (ref 4.0–10.5)

## 2015-08-11 LAB — LIPID PANEL
Cholesterol: 124 mg/dL — ABNORMAL LOW (ref 125–200)
HDL: 32 mg/dL — ABNORMAL LOW (ref >=40)
LDL CALC: 48 mg/dL (ref <130)
TRIGLYCERIDES: 222 mg/dL — AB (ref <150)
Total CHOL/HDL Ratio: 3.9 Ratio (ref <=5.0)
VLDL: 44 mg/dL — ABNORMAL HIGH (ref <30)

## 2015-08-11 LAB — HEMOGLOBIN A1C
HEMOGLOBIN A1C: 6.4 % — AB (ref <5.7)
Mean Plasma Glucose: 137 mg/dL — ABNORMAL HIGH (ref <117)

## 2015-08-11 NOTE — Telephone Encounter (Signed)
Patient scheduled labs for 08/11/15 (today) at 4pm.

## 2015-08-11 NOTE — Telephone Encounter (Signed)
Labs entered to be expected date today 08/11/15.

## 2015-08-11 NOTE — Addendum Note (Signed)
Addended by: Sharon Seller B on: 08/11/2015 03:42 PM   Modules accepted: Orders

## 2015-08-11 NOTE — Telephone Encounter (Signed)
Vitamin D, hgba1c, lipid, cmp, tsh, cbc for htn, hyperlipidemia, mixed, vitamin d def and diabetes type 2

## 2015-08-11 NOTE — Telephone Encounter (Signed)
Called patient back, left message for him to call back to schedule lab appt,/

## 2015-08-11 NOTE — Telephone Encounter (Signed)
Caller name: Self   Can be reached:[phone]   Reason for call: Patient requesting to labs prior to appointment 11/15.

## 2015-08-12 LAB — TSH: TSH: 2.048 u[IU]/mL (ref 0.350–4.500)

## 2015-08-12 LAB — VITAMIN D 25 HYDROXY (VIT D DEFICIENCY, FRACTURES): VIT D 25 HYDROXY: 30 ng/mL (ref 30–100)

## 2015-08-14 ENCOUNTER — Telehealth: Payer: Self-pay

## 2015-08-14 ENCOUNTER — Encounter: Payer: Self-pay | Admitting: Family Medicine

## 2015-08-14 ENCOUNTER — Telehealth: Payer: Self-pay | Admitting: Family Medicine

## 2015-08-14 NOTE — Telephone Encounter (Signed)
Pr- Visit call completed. 

## 2015-08-14 NOTE — Telephone Encounter (Signed)
Pt called back, best # [phone]

## 2015-08-14 NOTE — Telephone Encounter (Signed)
Left message for patient return call.

## 2015-08-14 NOTE — Telephone Encounter (Signed)
error 

## 2015-08-14 NOTE — Telephone Encounter (Signed)
Called patient back regarding Pr-Visit Call.

## 2015-08-15 ENCOUNTER — Encounter: Payer: Self-pay | Admitting: Family Medicine

## 2015-08-15 ENCOUNTER — Ambulatory Visit (INDEPENDENT_AMBULATORY_CARE_PROVIDER_SITE_OTHER): Payer: BLUE CROSS/BLUE SHIELD | Admitting: Family Medicine

## 2015-08-15 VITALS — BP 128/86 | HR 62 | Temp 98.3°F | Ht 71.0 in | Wt 231.0 lb

## 2015-08-15 DIAGNOSIS — E559 Vitamin D deficiency, unspecified: Secondary | ICD-10-CM

## 2015-08-15 DIAGNOSIS — R17 Unspecified jaundice: Secondary | ICD-10-CM

## 2015-08-15 DIAGNOSIS — M109 Gout, unspecified: Secondary | ICD-10-CM

## 2015-08-15 DIAGNOSIS — E669 Obesity, unspecified: Secondary | ICD-10-CM | POA: Diagnosis not present

## 2015-08-15 DIAGNOSIS — M8580 Other specified disorders of bone density and structure, unspecified site: Secondary | ICD-10-CM

## 2015-08-15 DIAGNOSIS — K760 Fatty (change of) liver, not elsewhere classified: Secondary | ICD-10-CM | POA: Diagnosis not present

## 2015-08-15 DIAGNOSIS — E1169 Type 2 diabetes mellitus with other specified complication: Secondary | ICD-10-CM

## 2015-08-15 DIAGNOSIS — Z86010 Personal history of colonic polyps: Secondary | ICD-10-CM

## 2015-08-15 DIAGNOSIS — I10 Essential (primary) hypertension: Secondary | ICD-10-CM | POA: Diagnosis not present

## 2015-08-15 DIAGNOSIS — Z1211 Encounter for screening for malignant neoplasm of colon: Secondary | ICD-10-CM

## 2015-08-15 DIAGNOSIS — R945 Abnormal results of liver function studies: Secondary | ICD-10-CM

## 2015-08-15 DIAGNOSIS — Z0000 Encounter for general adult medical examination without abnormal findings: Secondary | ICD-10-CM | POA: Diagnosis not present

## 2015-08-15 DIAGNOSIS — E785 Hyperlipidemia, unspecified: Secondary | ICD-10-CM

## 2015-08-15 DIAGNOSIS — E119 Type 2 diabetes mellitus without complications: Secondary | ICD-10-CM

## 2015-08-15 MED ORDER — ALLOPURINOL 100 MG PO TABS: 100.0000 mg | ORAL_TABLET | Freq: Two times a day (BID) | ORAL | Status: DC

## 2015-08-15 MED ORDER — LOSARTAN POTASSIUM 100 MG PO TABS: 100.0000 mg | ORAL_TABLET | Freq: Every day | ORAL | Status: DC

## 2015-08-15 MED ORDER — ATORVASTATIN CALCIUM 10 MG PO TABS: 10.0000 mg | ORAL_TABLET | Freq: Every day | ORAL | Status: DC

## 2015-08-15 MED ORDER — TRIAMTERENE-HCTZ 37.5-25 MG PO TABS: 1.0000 | ORAL_TABLET | Freq: Every day | ORAL | Status: DC

## 2015-08-15 MED ORDER — METFORMIN HCL 1000 MG PO TABS: 1000.0000 mg | ORAL_TABLET | Freq: Two times a day (BID) | ORAL | Status: DC

## 2015-08-15 NOTE — Patient Instructions (Signed)

## 2015-08-15 NOTE — Progress Notes (Signed)
Pre visit review using our clinic review tool, if applicable. No additional management support is needed unless otherwise documented below in the visit note. 

## 2015-08-21 ENCOUNTER — Other Ambulatory Visit (INDEPENDENT_AMBULATORY_CARE_PROVIDER_SITE_OTHER): Payer: BLUE CROSS/BLUE SHIELD

## 2015-08-21 DIAGNOSIS — E119 Type 2 diabetes mellitus without complications: Secondary | ICD-10-CM | POA: Diagnosis not present

## 2015-08-21 DIAGNOSIS — K7689 Other specified diseases of liver: Secondary | ICD-10-CM | POA: Diagnosis not present

## 2015-08-21 DIAGNOSIS — R17 Unspecified jaundice: Secondary | ICD-10-CM

## 2015-08-21 DIAGNOSIS — E669 Obesity, unspecified: Secondary | ICD-10-CM

## 2015-08-21 DIAGNOSIS — E1169 Type 2 diabetes mellitus with other specified complication: Secondary | ICD-10-CM

## 2015-08-21 DIAGNOSIS — R945 Abnormal results of liver function studies: Secondary | ICD-10-CM

## 2015-08-21 DIAGNOSIS — K760 Fatty (change of) liver, not elsewhere classified: Secondary | ICD-10-CM | POA: Diagnosis not present

## 2015-08-21 DIAGNOSIS — I10 Essential (primary) hypertension: Secondary | ICD-10-CM

## 2015-08-21 LAB — COMPREHENSIVE METABOLIC PANEL
ALBUMIN: 4 g/dL (ref 3.5–5.2)
ALT: 43 U/L (ref 0–53)
AST: 25 U/L (ref 0–37)
Alkaline Phosphatase: 100 U/L (ref 39–117)
BILIRUBIN TOTAL: 0.9 mg/dL (ref 0.2–1.2)
BUN: 17 mg/dL (ref 6–23)
CALCIUM: 10.1 mg/dL (ref 8.4–10.5)
CO2: 28 meq/L (ref 19–32)
CREATININE: 1.41 mg/dL (ref 0.40–1.50)
Chloride: 99 mEq/L (ref 96–112)
GFR: 56.46 mL/min — ABNORMAL LOW (ref >60.00)
Glucose, Bld: 148 mg/dL — ABNORMAL HIGH (ref 70–99)
Potassium: 4.3 mEq/L (ref 3.5–5.1)
SODIUM: 135 meq/L (ref 135–145)
Total Protein: 6.6 g/dL (ref 6.0–8.3)

## 2015-08-21 LAB — MICROALBUMIN / CREATININE URINE RATIO
CREATININE, U: 154.4 mg/dL
MICROALB UR: 1.3 mg/dL (ref 0.0–1.9)
Microalb Creat Ratio: 0.8 mg/g (ref 0.0–30.0)

## 2015-08-22 ENCOUNTER — Encounter: Payer: Self-pay | Admitting: Family Medicine

## 2015-08-22 LAB — CBC
HEMATOCRIT: 42.9 % (ref 39.0–52.0)
HEMOGLOBIN: 14.2 g/dL (ref 13.0–17.0)
MCHC: 33.1 g/dL (ref 30.0–36.0)
MCV: 87.5 fl (ref 78.0–100.0)
Platelets: 160 10*3/uL (ref 150.0–400.0)
RBC: 4.91 Mil/uL (ref 4.22–5.81)
RDW: 12.8 % (ref 11.5–15.5)
WBC: 8.3 10*3/uL (ref 4.0–10.5)

## 2015-08-25 ENCOUNTER — Ambulatory Visit (HOSPITAL_BASED_OUTPATIENT_CLINIC_OR_DEPARTMENT_OTHER): Payer: BLUE CROSS/BLUE SHIELD

## 2015-08-25 ENCOUNTER — Encounter: Payer: Self-pay | Admitting: Family Medicine

## 2015-08-25 NOTE — Assessment & Plan Note (Signed)
hgba1c acceptable, minimize simple carbs. Increase exercise as tolerated. Continue current meds 

## 2015-08-25 NOTE — Assessment & Plan Note (Signed)
Encouraged daily supplements 

## 2015-08-25 NOTE — Progress Notes (Signed)
Subjective:    Patient ID: Steve Beasley, male    DOB: 1966/03/11, 49 y.o.   MRN: [id number]  Chief Complaint  Patient presents with  . Annual Exam    HPI Patient is in today for annual exam and follow-up on numerous concerns. Overall Steve Beasley is doing well. Steve Beasley does have intermittent headaches. They occur randomly and with no associated symptoms. No nausea vomiting. No photophobia. No recent trauma or injuries. Steve Beasley continues to work long hours. Tries to maintain a heart healthy diet. Denies CP/palp/SOB/HA/congestion/fevers/GI or GU c/o. Taking meds as prescribed  Past Medical History  Diagnosis Date  . Diabetes mellitus without complication ([place])   . Hypertension   . Osteopenia   . Testicular cancer ([place])   . Allergy-induced asthma   . Obesity, unspecified 06/06/2013  . Nicotine use disorder 06/06/2013    smokeless  . Preventative health care 09/19/2013  . Anxiety state, unspecified 09/19/2013  . Fatty liver disease, nonalcoholic 40/98/1191  . Other and unspecified hyperlipidemia 01/19/2014  . Valvular heart disease 09/18/2014  . Right heart enlargement 09/18/2014  . Gout 09/18/2014  . Atypical chest pain 11/13/2014    Past Surgical History  Procedure Laterality Date  . Testicular cancer surgery    . Basal cell carcinoma excision      Within 6 months  . Skin cancer excision      Within 6 months    Family History  Problem Relation Age of Onset  . Diabetes Mother   . Breast cancer Mother   . Bone cancer Mother   . Cancer Mother     breast with bone mets  . Depression Mother   . Hypertension Father   . Prostate cancer Father   . Hyperlipidemia Father   . Cancer Father     prostate  . Heart attack Neg Hx   . Sudden death Neg Hx   . Stroke Paternal Grandfather   . Prostate cancer Maternal Grandfather   . Hypertension Brother   . Gallstones Brother   . Anxiety disorder Daughter   . ADD / ADHD Daughter   . Bulemia Daughter   . Depression Son   . Anxiety disorder  Maternal Aunt     social anxiety, anger  . Stroke Maternal Uncle 59    Social History   Social History  . Marital Status: Married    Spouse Name: N/A  . Number of Children: 2  . Years of Education: N/A   Occupational History  . finance    Social History Main Topics  . Smoking status: Former Smoker    Types: Cigarettes  . Smokeless tobacco: Former Systems developer    Quit date: 02/28/2014     Comment: 30 years ago socially  . Alcohol Use: 0.0 oz/week    0 Standard drinks or equivalent per week  . Drug Use: No  . Sexual Activity: Yes     Comment: lives with wife, travels with work, no dietary restrictions , exercise regular   Other Topics Concern  . Not on file   Social History Narrative    Outpatient Prescriptions Prior to Visit  Medication Sig Dispense Refill  . albuterol (PROVENTIL HFA;VENTOLIN HFA) 108 (90 BASE) MCG/ACT inhaler Inhale 2 puffs into the lungs every 6 (six) hours as needed for wheezing or shortness of breath. 1 Inhaler 2  . Cholecalciferol (VITAMIN D3) 5000 UNITS CAPS Take 1 capsule by mouth daily.    [facility] Kitchen glucose blood test strip CHECK BLOOD SUGAR DAILY AND PRN. 100  each 2  . Misc Natural Products (TART CHERRY ADVANCED) CAPS 2 (two) times daily. 250 mg    . allopurinol (ZYLOPRIM) 100 MG tablet Take 1 tablet (100 mg total) by mouth 2 (two) times daily. 60 tablet 4  . atorvastatin (LIPITOR) 10 MG tablet Take 1 tablet (10 mg total) by mouth at bedtime. 90 tablet 1  . losartan-hydrochlorothiazide (HYZAAR) 100-25 MG per tablet Take 1 tablet by mouth daily. 90 tablet 1  . metFORMIN (GLUCOPHAGE) 1000 MG tablet Take 1 tablet (1,000 mg total) by mouth 2 (two) times daily with a meal. 180 tablet 1  . Probiotic Product (ALIGN) 4 MG CAPS Take 1 tablet by mouth.    . TURMERIC CURCUMIN PO Take by mouth.     No facility-administered medications prior to visit.    No Known Allergies  Review of Systems  Constitutional: Negative for fever and malaise/fatigue.  HENT: Negative for  congestion.   Eyes: Negative for discharge.  Respiratory: Negative for shortness of breath.   Cardiovascular: Negative for chest pain, palpitations and leg swelling.  Gastrointestinal: Negative for nausea and abdominal pain.  Genitourinary: Negative for dysuria.  Musculoskeletal: Negative for falls.  Skin: Negative for rash.  Neurological: Negative for loss of consciousness and headaches.  Endo/Heme/Allergies: Negative for environmental allergies.  Psychiatric/Behavioral: Negative for depression. The patient is not nervous/anxious.        Objective:    Physical Exam  Constitutional: Steve Beasley is oriented to person, place, and time. Steve Beasley appears well-developed and well-nourished. No distress.  HENT:  Head: Normocephalic and atraumatic.  Nose: Nose normal.  Eyes: Right eye exhibits no discharge. Left eye exhibits no discharge.  Neck: Normal range of motion. Neck supple.  Cardiovascular: Normal rate and regular rhythm.   No murmur heard. Pulmonary/Chest: Effort normal and breath sounds normal.  Abdominal: Soft. Bowel sounds are normal. There is no tenderness.  Musculoskeletal: Steve Beasley exhibits no edema.  Neurological: Steve Beasley is alert and oriented to person, place, and time.  Skin: Skin is warm and dry.  Psychiatric: Steve Beasley has a normal mood and affect.  Nursing note and vitals reviewed.   BP 128/86 mmHg  Pulse 62  Temp(Src) 98.3 F (36.8 C) (Oral)  Ht '5\' 11"'  (1.803 m)  Wt 231 lb (104.781 kg)  BMI 32.23 kg/m2  SpO2 98% Wt Readings from Last 3 Encounters:  08/15/15 231 lb (104.781 kg)  03/09/15 230 lb (104.327 kg)  02/16/15 229 lb (103.874 kg)     Lab Results  Component Value Date   WBC 8.3 08/21/2015   HGB 14.2 08/21/2015   HCT 42.9 08/21/2015   PLT 160.0 08/21/2015   GLUCOSE 148* 08/21/2015   CHOL 124* 08/11/2015   TRIG 222* 08/11/2015   HDL 32* 08/11/2015   LDLDIRECT 71.0 02/10/2015   LDLCALC 48 08/11/2015   ALT 43 08/21/2015   AST 25 08/21/2015   NA 135 08/21/2015   K 4.3  08/21/2015   CL 99 08/21/2015   CREATININE 1.41 08/21/2015   BUN 17 08/21/2015   CO2 28 08/21/2015   TSH 2.048 08/11/2015   HGBA1C 6.4* 08/11/2015   MICROALBUR 1.3 08/21/2015    Lab Results  Component Value Date   TSH 2.048 08/11/2015   Lab Results  Component Value Date   WBC 8.3 08/21/2015   HGB 14.2 08/21/2015   HCT 42.9 08/21/2015   MCV 87.5 08/21/2015   PLT 160.0 08/21/2015   Lab Results  Component Value Date   NA 135 08/21/2015   K 4.3  08/21/2015   CO2 28 08/21/2015   GLUCOSE 148* 08/21/2015   BUN 17 08/21/2015   CREATININE 1.41 08/21/2015   BILITOT 0.9 08/21/2015   ALKPHOS 100 08/21/2015   AST 25 08/21/2015   ALT 43 08/21/2015   PROT 6.6 08/21/2015   ALBUMIN 4.0 08/21/2015   CALCIUM 10.1 08/21/2015   GFR 56.46* 08/21/2015   Lab Results  Component Value Date   CHOL 124* 08/11/2015   Lab Results  Component Value Date   HDL 32* 08/11/2015   Lab Results  Component Value Date   LDLCALC 48 08/11/2015   Lab Results  Component Value Date   TRIG 222* 08/11/2015   Lab Results  Component Value Date   CHOLHDL 3.9 08/11/2015   Lab Results  Component Value Date   HGBA1C 6.4* 08/11/2015       Assessment & Plan:   Problem List Items Addressed This Visit    Gout    No recent flares, encouraged adequate hydration      HTN (hypertension)    Well controlled, no changes to meds. Encouraged heart healthy diet such as the DASH diet and exercise as tolerated.       Relevant Medications   atorvastatin (LIPITOR) 10 MG tablet   triamterene-hydrochlorothiazide (MAXZIDE-25) 37.5-25 MG tablet   losartan (COZAAR) 100 MG tablet   Other Relevant Orders   US Abdomen Complete   CBC (Completed)   Comprehensive metabolic panel (Completed)   Ambulatory referral to Gastroenterology   Comp Met (CMET)   CBC with Differential/Platelet   TSH   Hemoglobin A1c   Hyperlipidemia    Encouraged heart healthy diet, increase exercise, avoid trans fats, consider a krill  oil cap daily      Relevant Medications   atorvastatin (LIPITOR) 10 MG tablet   triamterene-hydrochlorothiazide (MAXZIDE-25) 37.5-25 MG tablet   losartan (COZAAR) 100 MG tablet   Obesity    Encouraged DASH diet, decrease po intake and increase exercise as tolerated. Needs 7-8 hours of sleep nightly. Avoid trans fats, eat small, frequent meals every 4-5 hours with lean proteins, complex carbs and healthy fats. Minimize simple carbs, GMO foods.      Relevant Medications   metFORMIN (GLUCOPHAGE) 1000 MG tablet   Osteopenia   Relevant Orders   Vitamin D (25 hydroxy)   Preventative health care    Patient encouraged to maintain heart healthy diet, regular exercise, adequate sleep. Consider daily probiotics. Take medications as prescribed. Labs reviewed. Given and reviewed copy of ACP documents from [place] of State and encouraged to complete and return      Type 2 diabetes mellitus without complication (HCC)    [id number] acceptable, minimize simple carbs. Increase exercise as tolerated. Continue current meds      Relevant Medications   atorvastatin (LIPITOR) 10 MG tablet   metFORMIN (GLUCOPHAGE) 1000 MG tablet   losartan (COZAAR) 100 MG tablet   Vitamin D deficiency    Encouraged daily supplements       Other Visit Diagnoses    Elevated bilirubin    -  Primary    Relevant Orders    US Abdomen Complete    CBC (Completed)    Comprehensive metabolic panel (Completed)    Ambulatory referral to Gastroenterology    Fatty liver        Relevant Orders    US Abdomen Complete    CBC (Completed)    Comprehensive metabolic panel (Completed)    Ambulatory referral to Gastroenterology    Abnormal liver function  Relevant Orders    US Abdomen Complete    CBC (Completed)    Comprehensive metabolic panel (Completed)    Ambulatory referral to Gastroenterology    Diabetes mellitus type 2 in obese (HCC)        Relevant Medications    atorvastatin (LIPITOR) 10 MG tablet     metFORMIN (GLUCOPHAGE) 1000 MG tablet    losartan (COZAAR) 100 MG tablet    Other Relevant Orders    Microalbumin / creatinine urine ratio (Completed)    Ambulatory referral to Gastroenterology    Comp Met (CMET)    CBC with Differential/Platelet    TSH    Hemoglobin A1c    History of colonic polyps        Relevant Orders    Ambulatory referral to Gastroenterology    Colon cancer screening        Relevant Orders    Ambulatory referral to Gastroenterology       I have discontinued Steve Beasley's ALIGN, losartan-hydrochlorothiazide, and TURMERIC CURCUMIN PO. I am also having Steve Beasley start on triamterene-hydrochlorothiazide and losartan. Additionally, I am having Steve Beasley maintain his albuterol, Vitamin D3, glucose blood, TART CHERRY ADVANCED, NON FORMULARY, allopurinol, atorvastatin, and metFORMIN.  Meds ordered this encounter  Medications  . NON FORMULARY    Sig: curamin Takes 3 per day  . allopurinol (ZYLOPRIM) 100 MG tablet    Sig: Take 1 tablet (100 mg total) by mouth 2 (two) times daily.    Dispense:  180 tablet    Refill:  1  . atorvastatin (LIPITOR) 10 MG tablet    Sig: Take 1 tablet (10 mg total) by mouth at bedtime.    Dispense:  90 tablet    Refill:  1    DISCONTINUE ALL PREVIOUS REFILLS FOR THIS MEDICATION  . metFORMIN (GLUCOPHAGE) 1000 MG tablet    Sig: Take 1 tablet (1,000 mg total) by mouth 2 (two) times daily with a meal.    Dispense:  180 tablet    Refill:  1    DISCONTINUE ALL PREVIOUS REFILLS FOR THIS MEDICATION  . triamterene-hydrochlorothiazide (MAXZIDE-25) 37.5-25 MG tablet    Sig: Take 1 tablet by mouth daily.    Dispense:  30 tablet    Refill:  5  . losartan (COZAAR) 100 MG tablet    Sig: Take 1 tablet (100 mg total) by mouth daily.    Dispense:  30 tablet    Refill:  5     Penni Homans, MD

## 2015-08-25 NOTE — Assessment & Plan Note (Signed)
Well controlled, no changes to meds. Encouraged heart healthy diet such as the DASH diet and exercise as tolerated.  °

## 2015-08-25 NOTE — Assessment & Plan Note (Signed)
No recent flares, encouraged adequate hydration 

## 2015-08-25 NOTE — Assessment & Plan Note (Signed)
Patient encouraged to maintain heart healthy diet, regular exercise, adequate sleep. Consider daily probiotics. Take medications as prescribed. Labs reviewed. Given and reviewed copy of ACP documents from [place] Secretary of State and encouraged to complete and return 

## 2015-08-25 NOTE — Assessment & Plan Note (Signed)
Encouraged DASH diet, decrease po intake and increase exercise as tolerated. Needs 7-8 hours of sleep nightly. Avoid trans fats, eat small, frequent meals every 4-5 hours with lean proteins, complex carbs and healthy fats. Minimize simple carbs, GMO foods. 

## 2015-08-25 NOTE — Assessment & Plan Note (Signed)
Encouraged heart healthy diet, increase exercise, avoid trans fats, consider a krill oil cap daily 

## 2015-08-25 NOTE — Assessment & Plan Note (Signed)
>>  ASSESSMENT AND PLAN FOR HTN (HYPERTENSION) WRITTEN ON 08/25/2015  4:35 PM BY BLYTH, STACEY A, MD  Well controlled, no changes to meds. Encouraged heart healthy diet such as the DASH diet and exercise as tolerated.

## 2015-09-11 ENCOUNTER — Other Ambulatory Visit: Payer: Self-pay | Admitting: Family Medicine

## 2015-09-28 ENCOUNTER — Other Ambulatory Visit: Payer: Self-pay | Admitting: Family Medicine

## 2015-11-10 ENCOUNTER — Other Ambulatory Visit (INDEPENDENT_AMBULATORY_CARE_PROVIDER_SITE_OTHER): Payer: BLUE CROSS/BLUE SHIELD

## 2015-11-10 DIAGNOSIS — E669 Obesity, unspecified: Secondary | ICD-10-CM | POA: Diagnosis not present

## 2015-11-10 DIAGNOSIS — E119 Type 2 diabetes mellitus without complications: Secondary | ICD-10-CM

## 2015-11-10 DIAGNOSIS — I10 Essential (primary) hypertension: Secondary | ICD-10-CM | POA: Diagnosis not present

## 2015-11-10 DIAGNOSIS — M8580 Other specified disorders of bone density and structure, unspecified site: Secondary | ICD-10-CM | POA: Diagnosis not present

## 2015-11-10 DIAGNOSIS — E1169 Type 2 diabetes mellitus with other specified complication: Secondary | ICD-10-CM

## 2015-11-10 LAB — COMPREHENSIVE METABOLIC PANEL
ALT: 55 U/L — ABNORMAL HIGH (ref 0–53)
AST: 29 U/L (ref 0–37)
Albumin: 4.2 g/dL (ref 3.5–5.2)
Alkaline Phosphatase: 108 U/L (ref 39–117)
BUN: 20 mg/dL (ref 6–23)
CHLORIDE: 100 meq/L (ref 96–112)
CO2: 31 mEq/L (ref 19–32)
Calcium: 10.4 mg/dL (ref 8.4–10.5)
Creatinine, Ser: 1.31 mg/dL (ref 0.40–1.50)
GFR: 61.41 mL/min (ref >60.00)
GLUCOSE: 194 mg/dL — AB (ref 70–99)
POTASSIUM: 4.1 meq/L (ref 3.5–5.1)
SODIUM: 137 meq/L (ref 135–145)
TOTAL PROTEIN: 7 g/dL (ref 6.0–8.3)
Total Bilirubin: 0.9 mg/dL (ref 0.2–1.2)

## 2015-11-10 LAB — TSH: TSH: 1.58 u[IU]/mL (ref 0.35–4.50)

## 2015-11-10 LAB — HEMOGLOBIN A1C: Hgb A1c MFr Bld: 7 % — ABNORMAL HIGH (ref 4.6–6.5)

## 2015-11-10 LAB — VITAMIN D 25 HYDROXY (VIT D DEFICIENCY, FRACTURES): VITD: 34.4 ng/mL (ref 30.00–100.00)

## 2015-11-11 LAB — CBC WITH DIFFERENTIAL/PLATELET
Basophils Absolute: 0 10*3/uL (ref 0.0–0.1)
Basophils Relative: 0.3 % (ref 0.0–3.0)
EOS ABS: 0.1 10*3/uL (ref 0.0–0.7)
EOS PCT: 0.9 % (ref 0.0–5.0)
HCT: 44.4 % (ref 39.0–52.0)
Hemoglobin: 14.4 g/dL (ref 13.0–17.0)
LYMPHS ABS: 2.7 10*3/uL (ref 0.7–4.0)
Lymphocytes Relative: 29.1 % (ref 12.0–46.0)
MCHC: 32.4 g/dL (ref 30.0–36.0)
MCV: 91 fl (ref 78.0–100.0)
MONO ABS: 0.7 10*3/uL (ref 0.1–1.0)
Monocytes Relative: 7.2 % (ref 3.0–12.0)
NEUTROS PCT: 62.5 % (ref 43.0–77.0)
Neutro Abs: 5.9 10*3/uL (ref 1.4–7.7)
Platelets: 172 10*3/uL (ref 150.0–400.0)
RBC: 4.88 Mil/uL (ref 4.22–5.81)
RDW: 13.5 % (ref 11.5–15.5)
WBC: 9.4 10*3/uL (ref 4.0–10.5)

## 2015-11-23 ENCOUNTER — Encounter: Payer: Self-pay | Admitting: Family Medicine

## 2015-11-23 ENCOUNTER — Ambulatory Visit (INDEPENDENT_AMBULATORY_CARE_PROVIDER_SITE_OTHER): Payer: BLUE CROSS/BLUE SHIELD | Admitting: Family Medicine

## 2015-11-23 VITALS — BP 132/82 | HR 78 | Temp 98.4°F | Ht 71.0 in | Wt 235.2 lb

## 2015-11-23 DIAGNOSIS — E785 Hyperlipidemia, unspecified: Secondary | ICD-10-CM

## 2015-11-23 DIAGNOSIS — E559 Vitamin D deficiency, unspecified: Secondary | ICD-10-CM

## 2015-11-23 DIAGNOSIS — E669 Obesity, unspecified: Secondary | ICD-10-CM

## 2015-11-23 DIAGNOSIS — G4733 Obstructive sleep apnea (adult) (pediatric): Secondary | ICD-10-CM | POA: Diagnosis not present

## 2015-11-23 DIAGNOSIS — Z8547 Personal history of malignant neoplasm of testis: Secondary | ICD-10-CM

## 2015-11-23 DIAGNOSIS — I10 Essential (primary) hypertension: Secondary | ICD-10-CM

## 2015-11-23 DIAGNOSIS — M109 Gout, unspecified: Secondary | ICD-10-CM

## 2015-11-23 DIAGNOSIS — R350 Frequency of micturition: Secondary | ICD-10-CM

## 2015-11-23 DIAGNOSIS — E119 Type 2 diabetes mellitus without complications: Secondary | ICD-10-CM

## 2015-11-23 NOTE — Patient Instructions (Signed)
The Blue Zones   Basic Carbohydrate Counting for Diabetes Mellitus Carbohydrate counting is a method for keeping track of the amount of carbohydrates you eat. Eating carbohydrates naturally increases the level of sugar (glucose) in your blood, so it is important for you to know the amount that is okay for you to have in every meal. Carbohydrate counting helps keep the level of glucose in your blood within normal limits. The amount of carbohydrates allowed is different for every person. A dietitian can help you calculate the amount that is right for you. Once you know the amount of carbohydrates you can have, you can count the carbohydrates in the foods you want to eat. Carbohydrates are found in the following foods:  Grains, such as breads and cereals.  Dried beans and soy products.  Starchy vegetables, such as potatoes, peas, and corn.  Fruit and fruit juices.  Milk and yogurt.  Sweets and snack foods, such as cake, cookies, candy, chips, soft drinks, and fruit drinks. CARBOHYDRATE COUNTING There are two ways to count the carbohydrates in your food. You can use either of the methods or a combination of both. Reading the "Nutrition Facts" on [place] The "Nutrition Facts" is an area that is included on the labels of almost all packaged food and beverages in the [place]. It includes the serving size of that food or beverage and information about the nutrients in each serving of the food, including the grams (g) of carbohydrate per serving.  Decide the number of servings of this food or beverage that you will be able to eat or drink. Multiply that number of servings by the number of grams of carbohydrate that is listed on the label for that serving. The total will be the amount of carbohydrates you will be having when you eat or drink this food or beverage. Learning Standard Serving Sizes of Food When you eat food that is not packaged or does not include "Nutrition Facts" on the  label, you need to measure the servings in order to count the amount of carbohydrates.A serving of most carbohydrate-rich foods contains about 15 g of carbohydrates. The following list includes serving sizes of carbohydrate-rich foods that provide 15 g ofcarbohydrate per serving:   1 slice of bread (1 oz) or 1 six-inch tortilla.    of a hamburger bun or English muffin.  4-6 crackers.   cup unsweetened dry cereal.    cup hot cereal.   cup rice or pasta.    cup mashed potatoes or  of a large baked potato.  1 cup fresh fruit or one small piece of fruit.    cup canned or frozen fruit or fruit juice.  1 cup milk.   cup plain fat-free yogurt or yogurt sweetened with artificial sweeteners.   cup cooked dried beans or starchy vegetable, such as peas, corn, or potatoes.  Decide the number of standard-size servings that you will eat. Multiply that number of servings by 15 (the grams of carbohydrates in that serving). For example, if you eat 2 cups of strawberries, you will have eaten 2 servings and 30 g of carbohydrates (2 servings x 15 g = 30 g). For foods such as soups and casseroles, in which more than one food is mixed in, you will need to count the carbohydrates in each food that is included. EXAMPLE OF CARBOHYDRATE COUNTING Sample Dinner  3 oz chicken breast.   cup of brown rice.   cup of corn.  1 cup milk.  1 cup strawberries with sugar-free whipped topping.  Carbohydrate Calculation Step 1: Identify the foods that contain carbohydrates:   Rice.   Corn.   Milk.   Strawberries. Step 2:Calculate the number of servings eaten of each:   2 servings of rice.   1 serving of corn.   1 serving of milk.   1 serving of strawberries. Step 3: Multiply each of those number of servings by 15 g:   2 servings of rice x 15 g = 30 g.   1 serving of corn x 15 g = 15 g.   1 serving of milk x 15 g = 15 g.   1 serving of strawberries x 15 g = 15  g. Step 4: Add together all of the amounts to find the total grams of carbohydrates eaten: 30 g + 15 g + 15 g + 15 g = 75 g.   This information is not intended to replace advice given to you by your health care provider. Make sure you discuss any questions you have with your health care provider.   Document Released: 09/16/2005 Document Revised: 10/07/2014 Document Reviewed: 08/13/2013 Elsevier Interactive Patient Education [employer].

## 2015-12-03 NOTE — Progress Notes (Signed)
Patient ID: Steve Beasley, male   DOB: 09/30/1966, 50 y.o.   MRN: [id number]   Subjective:    Patient ID: Steve Beasley, male    DOB: 10/21/1965, 50 y.o.   MRN: [id number]  Chief Complaint  Patient presents with  . Follow-up    HPI Patient is in today for Follow-up. He is feeling well. He notes his systolic blood pressure has ranged from 70-120 since he was last seen. He has had some mild urinary frequency but is improved. His pain is improved as well although he does occasionally suffer groin and upper thigh pain. He is satisfied with the improvement in his symptoms spiking unclear about the etiology. No recent illness otherwise noted. Denies CP/palp/SOB/HA/congestion/fevers/GI c/o. Taking meds as prescribed  Past Medical History  Diagnosis Date  . Diabetes mellitus without complication ([place])   . Hypertension   . Osteopenia   . Testicular cancer ([place])   . Allergy-induced asthma   . Obesity, unspecified 06/06/2013  . Nicotine use disorder 06/06/2013    smokeless  . Preventative health care 09/19/2013  . Anxiety state, unspecified 09/19/2013  . Fatty liver disease, nonalcoholic [id number]  . Other and unspecified hyperlipidemia 01/19/2014  . Valvular heart disease 09/18/2014  . Right heart enlargement 09/18/2014  . Gout 09/18/2014  . Atypical chest pain 11/13/2014    Past Surgical History  Procedure Laterality Date  . Testicular cancer surgery    . Basal cell carcinoma excision      Within 6 months  . Skin cancer excision      Within 6 months    Family History  Problem Relation Age of Onset  . Diabetes Mother   . Breast cancer Mother   . Bone cancer Mother   . Cancer Mother     breast with bone mets  . Depression Mother   . Hypertension Father   . Prostate cancer Father   . Hyperlipidemia Father   . Cancer Father     prostate  . Heart attack Neg Hx   . Sudden death Neg Hx   . Stroke Paternal Grandfather   . Prostate cancer Maternal Grandfather   . Hypertension  Brother   . Gallstones Brother   . Anxiety disorder Daughter   . ADD / ADHD Daughter   . Bulemia Daughter   . Depression Son   . Anxiety disorder Maternal Aunt     social anxiety, anger  . Stroke Maternal Uncle 69    Social History   Social History  . Marital Status: Married    Spouse Name: N/A  . Number of Children: 2  . Years of Education: N/A   Occupational History  . finance    Social History Main Topics  . Smoking status: Former Smoker    Types: Cigarettes  . Smokeless tobacco: Former Systems developer    Quit date: 02/28/2014     Comment: 30 years ago socially  . Alcohol Use: 0.0 oz/week    0 Standard drinks or equivalent per week  . Drug Use: No  . Sexual Activity: Yes     Comment: lives with wife, travels with work, no dietary restrictions , exercise regular   Other Topics Concern  . Not on file   Social History Narrative    Outpatient Prescriptions Prior to Visit  Medication Sig Dispense Refill  . albuterol (PROVENTIL HFA;VENTOLIN HFA) 108 (90 BASE) MCG/ACT inhaler Inhale 2 puffs into the lungs every 6 (six) hours as needed for wheezing or shortness of breath. 1  Inhaler 2  . allopurinol (ZYLOPRIM) 100 MG tablet Take 1 tablet (100 mg total) by mouth 2 (two) times daily. 180 tablet 1  . atorvastatin (LIPITOR) 10 MG tablet Take 1 tablet (10 mg total) by mouth at bedtime. 90 tablet 1  . Cholecalciferol (VITAMIN D3) 5000 UNITS CAPS Take 1 capsule by mouth daily.    Marland Kitchen glucose blood test strip CHECK BLOOD SUGAR DAILY AND PRN. 100 each 2  . losartan (COZAAR) 100 MG tablet Take 1 tablet (100 mg total) by mouth daily. 30 tablet 5  . metFORMIN (GLUCOPHAGE) 1000 MG tablet TAKE 1 TABLET BY MOUTH TWICE DAILY WITH MEALS 60 tablet 6  . Misc Natural Products (TART CHERRY ADVANCED) CAPS 2 (two) times daily. 250 mg    . NON FORMULARY curamin Takes 3 per day    . triamterene-hydrochlorothiazide (MAXZIDE-25) 37.5-25 MG tablet Take 1 tablet by mouth daily. 30 tablet 5  . metFORMIN  (GLUCOPHAGE) 1000 MG tablet Take 1 tablet (1,000 mg total) by mouth 2 (two) times daily with a meal. 180 tablet 1   No facility-administered medications prior to visit.    No Known Allergies  Review of Systems  Constitutional: Negative for fever and malaise/fatigue.  HENT: Negative for congestion.   Eyes: Negative for discharge.  Respiratory: Negative for shortness of breath.   Cardiovascular: Negative for chest pain, palpitations and leg swelling.  Gastrointestinal: Negative for nausea and abdominal pain.  Genitourinary: Positive for frequency. Negative for dysuria.  Musculoskeletal: Negative for falls.  Skin: Negative for rash.  Neurological: Negative for loss of consciousness and headaches.  Endo/Heme/Allergies: Negative for environmental allergies.  Psychiatric/Behavioral: Negative for depression. The patient is nervous/anxious.        Objective:    Physical Exam  Constitutional: He is oriented to person, place, and time. He appears well-developed and well-nourished. No distress.  HENT:  Head: Normocephalic and atraumatic.  Nose: Nose normal.  Eyes: Right eye exhibits no discharge. Left eye exhibits no discharge.  Neck: Normal range of motion. Neck supple.  Cardiovascular: Normal rate and regular rhythm.   No murmur heard. Pulmonary/Chest: Effort normal and breath sounds normal.  Abdominal: Soft. Bowel sounds are normal. There is no tenderness.  Musculoskeletal: He exhibits no edema.  Neurological: He is alert and oriented to person, place, and time.  Skin: Skin is warm and dry.  Psychiatric: He has a normal mood and affect.  Nursing note and vitals reviewed.   BP 132/82 mmHg  Pulse 78  Temp(Src) 98.4 F (36.9 C) (Oral)  Ht 5\' 11"  (1.803 m)  Wt 235 lb 4 oz (106.709 kg)  BMI 32.83 kg/m2  SpO2 98% Wt Readings from Last 3 Encounters:  11/23/15 235 lb 4 oz (106.709 kg)  08/15/15 231 lb (104.781 kg)  03/09/15 230 lb (104.327 kg)     Lab Results  Component  Value Date   WBC 9.4 11/10/2015   HGB 14.4 11/10/2015   HCT 44.4 11/10/2015   PLT 172.0 11/10/2015   GLUCOSE 194* 11/10/2015   CHOL 124* 08/11/2015   TRIG 222* 08/11/2015   HDL 32* 08/11/2015   LDLDIRECT 71.0 02/10/2015   LDLCALC 48 08/11/2015   ALT 55* 11/10/2015   AST 29 11/10/2015   NA 137 11/10/2015   K 4.1 11/10/2015   CL 100 11/10/2015   CREATININE 1.31 11/10/2015   BUN 20 11/10/2015   CO2 31 11/10/2015   TSH 1.58 11/10/2015   HGBA1C 7.0* 11/10/2015   MICROALBUR 1.3 08/21/2015    Lab  Results  Component Value Date   TSH 1.58 11/10/2015   Lab Results  Component Value Date   WBC 9.4 11/10/2015   HGB 14.4 11/10/2015   HCT 44.4 11/10/2015   MCV 91.0 11/10/2015   PLT 172.0 11/10/2015   Lab Results  Component Value Date   NA 137 11/10/2015   K 4.1 11/10/2015   CO2 31 11/10/2015   GLUCOSE 194* 11/10/2015   BUN 20 11/10/2015   CREATININE 1.31 11/10/2015   BILITOT 0.9 11/10/2015   ALKPHOS 108 11/10/2015   AST 29 11/10/2015   ALT 55* 11/10/2015   PROT 7.0 11/10/2015   ALBUMIN 4.2 11/10/2015   CALCIUM 10.4 11/10/2015   GFR 61.41 11/10/2015   Lab Results  Component Value Date   CHOL 124* 08/11/2015   Lab Results  Component Value Date   HDL 32* 08/11/2015   Lab Results  Component Value Date   LDLCALC 48 08/11/2015   Lab Results  Component Value Date   TRIG 222* 08/11/2015   Lab Results  Component Value Date   CHOLHDL 3.9 08/11/2015   Lab Results  Component Value Date   HGBA1C 7.0* 11/10/2015       Assessment & Plan:   Problem List Items Addressed This Visit    DM (diabetes mellitus), type 2 ([place])    hgba1c acceptable, minimize simple carbs. Increase exercise as tolerated. Continue current meds      Relevant Orders   TSH   CBC   Hemoglobin A1c   Lipid panel   Comprehensive metabolic panel   Uric acid   Gout   Relevant Orders   TSH   CBC   Hemoglobin A1c   Lipid panel   Comprehensive metabolic panel   Uric acid   HTN  (hypertension)    Well controlled, no changes to meds. Encouraged heart healthy diet such as the DASH diet and exercise as tolerated.       Relevant Orders   TSH   CBC   Hemoglobin A1c   Lipid panel   Comprehensive metabolic panel   Uric acid   Hyperlipidemia   Relevant Orders   TSH   CBC   Hemoglobin A1c   Lipid panel   Comprehensive metabolic panel   Uric acid   Obesity    Encouraged DASH diet, decrease po intake and increase exercise as tolerated. Needs 7-8 hours of sleep nightly. Avoid trans fats, eat small, frequent meals every 4-5 hours with lean proteins, complex carbs and healthy fats. Minimize simple carbs, GMO foods.      Relevant Orders   TSH   CBC   Hemoglobin A1c   Lipid panel   Comprehensive metabolic panel   Uric acid   OSA (obstructive sleep apnea)   Relevant Orders   TSH   CBC   Hemoglobin A1c   Lipid panel   Comprehensive metabolic panel   Uric acid   Urinary frequency    Doing well at this time. Does occasionally have some groin and thigh discomfort but it is improved and work up was unremarkable. He will report worsening symptoms      Vitamin D deficiency - Primary    Encouraged daily supplements      Relevant Orders   TSH   CBC   Hemoglobin A1c   Lipid panel   Comprehensive metabolic panel   Uric acid    Other Visit Diagnoses    H/O testicular cancer        Relevant Orders  PSA    Testosterone       I am having Mr. Larene Beach maintain his albuterol, Vitamin D3, glucose blood, TART CHERRY ADVANCED, NON FORMULARY, allopurinol, atorvastatin, triamterene-hydrochlorothiazide, losartan, and metFORMIN.  No orders of the defined types were placed in this encounter.     Penni Homans, MD

## 2015-12-03 NOTE — Assessment & Plan Note (Signed)
Doing well at this time. Does occasionally have some groin and thigh discomfort but it is improved and work up was unremarkable. He will report worsening symptoms

## 2015-12-03 NOTE — Assessment & Plan Note (Signed)
hgba1c acceptable, minimize simple carbs. Increase exercise as tolerated. Continue current meds 

## 2015-12-03 NOTE — Assessment & Plan Note (Signed)
Well controlled, no changes to meds. Encouraged heart healthy diet such as the DASH diet and exercise as tolerated.  °

## 2015-12-03 NOTE — Assessment & Plan Note (Signed)
Encouraged daily supplements 

## 2015-12-03 NOTE — Assessment & Plan Note (Signed)
>>  ASSESSMENT AND PLAN FOR HTN (HYPERTENSION) WRITTEN ON 12/03/2015 11:32 AM BY BLYTH, STACEY A, MD  Well controlled, no changes to meds. Encouraged heart healthy diet such as the DASH diet and exercise as tolerated.

## 2015-12-03 NOTE — Assessment & Plan Note (Signed)
Encouraged DASH diet, decrease po intake and increase exercise as tolerated. Needs 7-8 hours of sleep nightly. Avoid trans fats, eat small, frequent meals every 4-5 hours with lean proteins, complex carbs and healthy fats. Minimize simple carbs, GMO foods. 

## 2016-02-13 ENCOUNTER — Other Ambulatory Visit: Payer: Self-pay | Admitting: Family Medicine

## 2016-02-23 ENCOUNTER — Ambulatory Visit: Payer: BLUE CROSS/BLUE SHIELD | Admitting: Family Medicine

## 2016-03-05 ENCOUNTER — Other Ambulatory Visit: Payer: Self-pay | Admitting: Family Medicine

## 2016-03-09 ENCOUNTER — Encounter: Payer: Self-pay | Admitting: Family Medicine

## 2016-03-14 ENCOUNTER — Other Ambulatory Visit: Payer: Self-pay | Admitting: Family Medicine

## 2016-03-27 ENCOUNTER — Other Ambulatory Visit (INDEPENDENT_AMBULATORY_CARE_PROVIDER_SITE_OTHER): Payer: BLUE CROSS/BLUE SHIELD

## 2016-03-27 DIAGNOSIS — Z8547 Personal history of malignant neoplasm of testis: Secondary | ICD-10-CM

## 2016-03-27 DIAGNOSIS — E785 Hyperlipidemia, unspecified: Secondary | ICD-10-CM

## 2016-03-27 DIAGNOSIS — E559 Vitamin D deficiency, unspecified: Secondary | ICD-10-CM | POA: Diagnosis not present

## 2016-03-27 DIAGNOSIS — E119 Type 2 diabetes mellitus without complications: Secondary | ICD-10-CM | POA: Diagnosis not present

## 2016-03-27 DIAGNOSIS — E669 Obesity, unspecified: Secondary | ICD-10-CM | POA: Diagnosis not present

## 2016-03-27 DIAGNOSIS — G4733 Obstructive sleep apnea (adult) (pediatric): Secondary | ICD-10-CM | POA: Diagnosis not present

## 2016-03-27 DIAGNOSIS — M109 Gout, unspecified: Secondary | ICD-10-CM

## 2016-03-27 DIAGNOSIS — I10 Essential (primary) hypertension: Secondary | ICD-10-CM

## 2016-03-27 LAB — CBC
HCT: 43.9 % (ref 39.0–52.0)
Hemoglobin: 14.7 g/dL (ref 13.0–17.0)
MCHC: 33.6 g/dL (ref 30.0–36.0)
MCV: 87.2 fl (ref 78.0–100.0)
Platelets: 143 10*3/uL — ABNORMAL LOW (ref 150.0–400.0)
RBC: 5.03 Mil/uL (ref 4.22–5.81)
RDW: 13.3 % (ref 11.5–15.5)
WBC: 9.1 10*3/uL (ref 4.0–10.5)

## 2016-03-27 LAB — COMPREHENSIVE METABOLIC PANEL WITH GFR
ALT: 40 U/L (ref 0–53)
AST: 34 U/L (ref 0–37)
Albumin: 4.3 g/dL (ref 3.5–5.2)
Alkaline Phosphatase: 87 U/L (ref 39–117)
BUN: 19 mg/dL (ref 6–23)
CO2: 30 meq/L (ref 19–32)
Calcium: 10.5 mg/dL (ref 8.4–10.5)
Chloride: 100 meq/L (ref 96–112)
Creatinine, Ser: 1.28 mg/dL (ref 0.40–1.50)
GFR: 62.98 mL/min
Glucose, Bld: 134 mg/dL — ABNORMAL HIGH (ref 70–99)
Potassium: 4.2 meq/L (ref 3.5–5.1)
Sodium: 138 meq/L (ref 135–145)
Total Bilirubin: 1.4 mg/dL — ABNORMAL HIGH (ref 0.2–1.2)
Total Protein: 6.8 g/dL (ref 6.0–8.3)

## 2016-03-27 LAB — LIPID PANEL
Cholesterol: 125 mg/dL (ref 0–200)
HDL: 38.9 mg/dL — ABNORMAL LOW
LDL Cholesterol: 52 mg/dL (ref 0–99)
NonHDL: 86.15
Total CHOL/HDL Ratio: 3
Triglycerides: 173 mg/dL — ABNORMAL HIGH (ref 0.0–149.0)
VLDL: 34.6 mg/dL (ref 0.0–40.0)

## 2016-03-27 LAB — URIC ACID: URIC ACID, SERUM: 7.1 mg/dL (ref 4.0–7.8)

## 2016-03-27 LAB — PSA: PSA: 2.9 ng/mL (ref 0.10–4.00)

## 2016-03-27 LAB — HEMOGLOBIN A1C: HEMOGLOBIN A1C: 5.8 % (ref 4.6–6.5)

## 2016-03-27 LAB — TESTOSTERONE: TESTOSTERONE: 561.94 ng/dL (ref 300.00–890.00)

## 2016-03-27 LAB — TSH: TSH: 1.95 u[IU]/mL (ref 0.35–4.50)

## 2016-04-01 ENCOUNTER — Ambulatory Visit (INDEPENDENT_AMBULATORY_CARE_PROVIDER_SITE_OTHER): Payer: BLUE CROSS/BLUE SHIELD | Admitting: Family Medicine

## 2016-04-01 ENCOUNTER — Encounter: Payer: Self-pay | Admitting: Family Medicine

## 2016-04-01 VITALS — BP 120/72 | HR 60 | Temp 98.1°F | Ht 71.0 in | Wt 214.0 lb

## 2016-04-01 DIAGNOSIS — E119 Type 2 diabetes mellitus without complications: Secondary | ICD-10-CM

## 2016-04-01 DIAGNOSIS — Z0000 Encounter for general adult medical examination without abnormal findings: Secondary | ICD-10-CM

## 2016-04-01 DIAGNOSIS — F17200 Nicotine dependence, unspecified, uncomplicated: Secondary | ICD-10-CM

## 2016-04-01 DIAGNOSIS — I10 Essential (primary) hypertension: Secondary | ICD-10-CM

## 2016-04-01 DIAGNOSIS — E782 Mixed hyperlipidemia: Secondary | ICD-10-CM

## 2016-04-01 DIAGNOSIS — R351 Nocturia: Secondary | ICD-10-CM

## 2016-04-01 DIAGNOSIS — M8580 Other specified disorders of bone density and structure, unspecified site: Secondary | ICD-10-CM

## 2016-04-01 DIAGNOSIS — K760 Fatty (change of) liver, not elsewhere classified: Secondary | ICD-10-CM

## 2016-04-01 DIAGNOSIS — Z1211 Encounter for screening for malignant neoplasm of colon: Secondary | ICD-10-CM

## 2016-04-01 DIAGNOSIS — M109 Gout, unspecified: Secondary | ICD-10-CM

## 2016-04-01 DIAGNOSIS — E559 Vitamin D deficiency, unspecified: Secondary | ICD-10-CM

## 2016-04-01 NOTE — Assessment & Plan Note (Signed)
hgba1c acceptable, minimize simple carbs. Increase exercise as tolerated. Continue current meds 

## 2016-04-01 NOTE — Progress Notes (Signed)
Pre visit review using our clinic review tool, if applicable. No additional management support is needed unless otherwise documented below in the visit note. 

## 2016-04-01 NOTE — Assessment & Plan Note (Signed)
Stopped Maxzide temporarily but restarted when

## 2016-04-01 NOTE — Patient Instructions (Signed)
The Blue Zones  Hypertension Hypertension, commonly called high blood pressure, is when the force of blood pumping through your arteries is too strong. Your arteries are the blood vessels that carry blood from your heart throughout your body. A blood pressure reading consists of a higher number over a lower number, such as 110/72. The higher number (systolic) is the pressure inside your arteries when your heart pumps. The lower number (diastolic) is the pressure inside your arteries when your heart relaxes. Ideally you want your blood pressure below 120/80. Hypertension forces your heart to work harder to pump blood. Your arteries may become narrow or stiff. Having untreated or uncontrolled hypertension can cause heart attack, stroke, kidney disease, and other problems. RISK FACTORS Some risk factors for high blood pressure are controllable. Others are not.  Risk factors you cannot control include:   Race. You may be at higher risk if you are African American.  Age. Risk increases with age.  Gender. Men are at higher risk than women before age 105 years. After age 93, women are at higher risk than men. Risk factors you can control include:  Not getting enough exercise or physical activity.  Being overweight.  Getting too much fat, sugar, calories, or salt in your diet.  Drinking too much alcohol. SIGNS AND SYMPTOMS Hypertension does not usually cause signs or symptoms. Extremely high blood pressure (hypertensive crisis) may cause headache, anxiety, shortness of breath, and nosebleed. DIAGNOSIS To check if you have hypertension, your health care provider will measure your blood pressure while you are seated, with your arm held at the level of your heart. It should be measured at least twice using the same arm. Certain conditions can cause a difference in blood pressure between your right and left arms. A blood pressure reading that is higher than normal on one occasion does not mean that you  need treatment. If it is not clear whether you have high blood pressure, you may be asked to return on a different day to have your blood pressure checked again. Or, you may be asked to monitor your blood pressure at home for 1 or more weeks. TREATMENT Treating high blood pressure includes making lifestyle changes and possibly taking medicine. Living a healthy lifestyle can help lower high blood pressure. You may need to change some of your habits. Lifestyle changes may include:  Following the DASH diet. This diet is high in fruits, vegetables, and whole grains. It is low in salt, red meat, and added sugars.  Keep your sodium intake below 2,300 mg per day.  Getting at least 30-45 minutes of aerobic exercise at least 4 times per week.  Losing weight if necessary.  Not smoking.  Limiting alcoholic beverages.  Learning ways to reduce stress. Your health care provider may prescribe medicine if lifestyle changes are not enough to get your blood pressure under control, and if one of the following is true:  You are 93-63 years of age and your systolic blood pressure is above 140.  You are 41 years of age or older, and your systolic blood pressure is above 150.  Your diastolic blood pressure is above 90.  You have diabetes, and your systolic blood pressure is over [id number] or your diastolic blood pressure is over 90.  You have kidney disease and your blood pressure is above 140/90.  You have heart disease and your blood pressure is above 140/90. Your personal target blood pressure may vary depending on your medical conditions, your age, and other  factors. HOME CARE INSTRUCTIONS  Have your blood pressure rechecked as directed by your health care provider.   Take medicines only as directed by your health care provider. Follow the directions carefully. Blood pressure medicines must be taken as prescribed. The medicine does not work as well when you skip doses. Skipping doses also puts you at  risk for problems.  Do not smoke.   Monitor your blood pressure at home as directed by your health care provider. SEEK MEDICAL CARE IF:   You think you are having a reaction to medicines taken.  You have recurrent headaches or feel dizzy.  You have swelling in your ankles.  You have trouble with your vision. SEEK IMMEDIATE MEDICAL CARE IF:  You develop a severe headache or confusion.  You have unusual weakness, numbness, or feel faint.  You have severe chest or abdominal pain.  You vomit repeatedly.  You have trouble breathing. MAKE SURE YOU:   Understand these instructions.  Will watch your condition.  Will get help right away if you are not doing well or get worse.   This information is not intended to replace advice given to you by your health care provider. Make sure you discuss any questions you have with your health care provider.   Document Released: 09/16/2005 Document Revised: 01/31/2015 Document Reviewed: 07/09/2013 Elsevier Interactive Patient Education [employer].

## 2016-04-01 NOTE — Assessment & Plan Note (Signed)
>>  ASSESSMENT AND PLAN FOR HTN (HYPERTENSION) WRITTEN ON 04/01/2016  9:11 AM BY BLYTH, STACEY A, MD  Stopped Maxzide temporarily but restarted when

## 2016-04-07 NOTE — Assessment & Plan Note (Signed)
No recent flares doing well on Allopurinol, no changes today

## 2016-04-07 NOTE — Assessment & Plan Note (Signed)
Encouraged to get adequate exercise, calcium and vitamin d intake 

## 2016-04-07 NOTE — Assessment & Plan Note (Signed)
Take 2000 IU daily

## 2016-04-07 NOTE — Assessment & Plan Note (Signed)
Is using much less and is attempting complete cessation

## 2016-04-07 NOTE — Progress Notes (Signed)
Patient ID: Steve Beasley, male   DOB: 13-Aug-1966, 50 y.o.   MRN: [id number]   Subjective:    Patient ID: Steve Beasley, male    DOB: Oct 10, 1965, 50 y.o.   MRN: [id number]  Chief Complaint  Patient presents with  . Follow-up    HPI Patient is in today for follow up. He feels well. No recent illness or hospitalization. His only complaint are a couple of episodes of feeling light headed for a short time after exercising. No syncope or further complaints. After discussion he agrees dehydration may have played a role. He declines Pneumonia shot today. Notes some nocturia, getting up once to twice a night but no frequency during the day or dysuria. Denies CP/palp/SOB/HA/congestion/fevers/GI c/o. Taking meds as prescribed  Past Medical History  Diagnosis Date  . Diabetes mellitus without complication ([place])   . Hypertension   . Osteopenia   . Testicular cancer ([place])   . Allergy-induced asthma   . Obesity, unspecified 06/06/2013  . Nicotine use disorder 06/06/2013    smokeless  . Preventative health care 09/19/2013  . Anxiety state, unspecified 09/19/2013  . Fatty liver disease, nonalcoholic [id number]  . Other and unspecified hyperlipidemia 01/19/2014  . Valvular heart disease 09/18/2014  . Right heart enlargement 09/18/2014  . Gout 09/18/2014  . Atypical chest pain 11/13/2014    Past Surgical History  Procedure Laterality Date  . Testicular cancer surgery    . Basal cell carcinoma excision      Within 6 months  . Skin cancer excision      Within 6 months    Family History  Problem Relation Age of Onset  . Diabetes Mother   . Breast cancer Mother   . Bone cancer Mother   . Cancer Mother     breast with bone mets  . Depression Mother   . Hypertension Father   . Prostate cancer Father   . Hyperlipidemia Father   . Cancer Father     prostate  . Heart attack Neg Hx   . Sudden death Neg Hx   . Stroke Paternal Grandfather   . Prostate cancer Maternal Grandfather   .  Hypertension Brother   . Gallstones Brother   . Anxiety disorder Daughter   . ADD / ADHD Daughter   . Bulemia Daughter   . Depression Son   . Anxiety disorder Maternal Aunt     social anxiety, anger  . Stroke Maternal Uncle 27    Social History   Social History  . Marital Status: Married    Spouse Name: N/A  . Number of Children: 2  . Years of Education: N/A   Occupational History  . finance    Social History Main Topics  . Smoking status: Former Smoker    Types: Cigarettes  . Smokeless tobacco: Former Systems developer    Quit date: 02/28/2014     Comment: 30 years ago socially  . Alcohol Use: 0.0 oz/week    0 Standard drinks or equivalent per week  . Drug Use: No  . Sexual Activity: Yes     Comment: lives with wife, travels with work, no dietary restrictions , exercise regular   Other Topics Concern  . Not on file   Social History Narrative    Outpatient Prescriptions Prior to Visit  Medication Sig Dispense Refill  . albuterol (PROVENTIL HFA;VENTOLIN HFA) 108 (90 BASE) MCG/ACT inhaler Inhale 2 puffs into the lungs every 6 (six) hours as needed for wheezing or shortness of breath.  1 Inhaler 2  . allopurinol (ZYLOPRIM) 100 MG tablet TAKE 1 TABLET(100 MG) BY MOUTH TWICE DAILY 180 tablet 0  . atorvastatin (LIPITOR) 10 MG tablet Take 1 tablet (10 mg total) by mouth at bedtime. 90 tablet 1  . Cholecalciferol (VITAMIN D3) 5000 UNITS CAPS Take 1 capsule by mouth daily.    Marland Kitchen glucose blood test strip CHECK BLOOD SUGAR DAILY AND PRN. 100 each 2  . losartan (COZAAR) 100 MG tablet TAKE 1 TABLET(100 MG) BY MOUTH DAILY 30 tablet 5  . metFORMIN (GLUCOPHAGE) 1000 MG tablet TAKE 1 TABLET BY MOUTH TWICE DAILY WITH MEALS 60 tablet 6  . metFORMIN (GLUCOPHAGE) 1000 MG tablet TAKE 1 TABLET BY MOUTH TWICE DAILY WITH MEALS 180 tablet 0  . Misc Natural Products (TART CHERRY ADVANCED) CAPS 2 (two) times daily. 250 mg    . NON FORMULARY curamin Takes 3 per day    . triamterene-hydrochlorothiazide  (MAXZIDE-25) 37.5-25 MG tablet TAKE 1 TABLET BY MOUTH DAILY 30 tablet 5   No facility-administered medications prior to visit.    No Known Allergies  Review of Systems  Constitutional: Negative for fever and malaise/fatigue.  HENT: Negative for congestion.   Eyes: Negative for blurred vision.  Respiratory: Negative for shortness of breath.   Cardiovascular: Negative for chest pain, palpitations and leg swelling.  Gastrointestinal: Negative for nausea, abdominal pain and blood in stool.  Genitourinary: Positive for frequency. Negative for dysuria.  Musculoskeletal: Negative for falls.  Skin: Negative for rash.  Neurological: Negative for dizziness, loss of consciousness and headaches.  Endo/Heme/Allergies: Negative for environmental allergies.  Psychiatric/Behavioral: Negative for depression. The patient is not nervous/anxious.        Objective:    Physical Exam  Constitutional: He is oriented to person, place, and time. He appears well-developed and well-nourished. No distress.  HENT:  Head: Normocephalic and atraumatic.  Nose: Nose normal.  Eyes: Right eye exhibits no discharge. Left eye exhibits no discharge.  Neck: Normal range of motion. Neck supple.  Cardiovascular: Normal rate and regular rhythm.   No murmur heard. Pulmonary/Chest: Effort normal and breath sounds normal.  Abdominal: Soft. Bowel sounds are normal. There is no tenderness.  Musculoskeletal: He exhibits no edema.  Neurological: He is alert and oriented to person, place, and time.  Skin: Skin is warm and dry.  Psychiatric: He has a normal mood and affect.  Nursing note and vitals reviewed.   BP 120/72 mmHg  Pulse 60  Temp(Src) 98.1 F (36.7 C) (Oral)  Ht 5\' 11"  (1.803 m)  Wt 214 lb (97.07 kg)  BMI 29.86 kg/m2  SpO2 98% Wt Readings from Last 3 Encounters:  04/01/16 214 lb (97.07 kg)  11/23/15 235 lb 4 oz (106.709 kg)  08/15/15 231 lb (104.781 kg)     Lab Results  Component Value Date   WBC  9.1 03/27/2016   HGB 14.7 03/27/2016   HCT 43.9 03/27/2016   PLT 143.0* 03/27/2016   GLUCOSE 134* 03/27/2016   CHOL 125 03/27/2016   TRIG 173.0* 03/27/2016   HDL 38.90* 03/27/2016   LDLDIRECT 71.0 02/10/2015   LDLCALC 52 03/27/2016   ALT 40 03/27/2016   AST 34 03/27/2016   NA 138 03/27/2016   K 4.2 03/27/2016   CL 100 03/27/2016   CREATININE 1.28 03/27/2016   BUN 19 03/27/2016   CO2 30 03/27/2016   TSH 1.95 03/27/2016   PSA 2.90 03/27/2016   HGBA1C 5.8 03/27/2016   MICROALBUR 1.3 08/21/2015    Lab Results  Component Value Date   TSH 1.95 03/27/2016   Lab Results  Component Value Date   WBC 9.1 03/27/2016   HGB 14.7 03/27/2016   HCT 43.9 03/27/2016   MCV 87.2 03/27/2016   PLT 143.0* 03/27/2016   Lab Results  Component Value Date   NA 138 03/27/2016   K 4.2 03/27/2016   CO2 30 03/27/2016   GLUCOSE 134* 03/27/2016   BUN 19 03/27/2016   CREATININE 1.28 03/27/2016   BILITOT 1.4* 03/27/2016   ALKPHOS 87 03/27/2016   AST 34 03/27/2016   ALT 40 03/27/2016   PROT 6.8 03/27/2016   ALBUMIN 4.3 03/27/2016   CALCIUM 10.5 03/27/2016   GFR 62.98 03/27/2016   Lab Results  Component Value Date   CHOL 125 03/27/2016   Lab Results  Component Value Date   HDL 38.90* 03/27/2016   Lab Results  Component Value Date   LDLCALC 52 03/27/2016   Lab Results  Component Value Date   TRIG 173.0* 03/27/2016   Lab Results  Component Value Date   CHOLHDL 3 03/27/2016   Lab Results  Component Value Date   HGBA1C 5.8 03/27/2016       Assessment & Plan:   Problem List Items Addressed This Visit    DM (diabetes mellitus), type 2 ([place])    hgba1c acceptable, minimize simple carbs. Increase exercise as tolerated. Continue current meds      Relevant Orders   Comprehensive metabolic panel   Lipid panel   TSH   CBC   Hemoglobin A1c   HIV antibody (with reflex)   Microalbumin / creatinine urine ratio   PSA   HTN (hypertension) - Primary    Stopped Maxzide  temporarily but restarted when       Relevant Orders   Comprehensive metabolic panel   Lipid panel   TSH   CBC   Hemoglobin A1c   HIV antibody (with reflex)   Microalbumin / creatinine urine ratio   PSA   Osteopenia    Encouraged to get adequate exercise, calcium and vitamin d intake      Vitamin D deficiency    Take 2000 IU daily      Relevant Orders   Vitamin D (25 hydroxy)   Nicotine use disorder    Is using much less and is attempting complete cessation      Preventative health care   Relevant Orders   Comprehensive metabolic panel   Lipid panel   TSH   CBC   Hemoglobin A1c   HIV antibody (with reflex)   Microalbumin / creatinine urine ratio   PSA   Hepatitis C Antibody   Fatty liver disease, nonalcoholic   Relevant Orders   Hepatitis C Antibody   Gout    No recent flares doing well on Allopurinol, no changes today      Relevant Orders   Uric acid    Other Visit Diagnoses    Colon cancer screening        Relevant Orders    Ambulatory referral to Gastroenterology    Comprehensive metabolic panel    Lipid panel    TSH    CBC    Hemoglobin A1c    HIV antibody (with reflex)    Microalbumin / creatinine urine ratio    PSA    Hyperlipidemia, mixed        Relevant Orders    Comprehensive metabolic panel    Lipid panel    TSH    CBC  Hemoglobin A1c    HIV antibody (with reflex)    Microalbumin / creatinine urine ratio    PSA    Nocturia        Relevant Orders    Comprehensive metabolic panel    Lipid panel    TSH    CBC    Hemoglobin A1c    HIV antibody (with reflex)    Microalbumin / creatinine urine ratio    PSA       I am having Mr. Larene Beach maintain his albuterol, Vitamin D3, glucose blood, TART CHERRY ADVANCED, NON FORMULARY, atorvastatin, metFORMIN, triamterene-hydrochlorothiazide, losartan, allopurinol, and metFORMIN.  No orders of the defined types were placed in this encounter.     Penni Homans, MD

## 2016-04-21 ENCOUNTER — Other Ambulatory Visit: Payer: Self-pay | Admitting: Family Medicine

## 2016-06-20 ENCOUNTER — Other Ambulatory Visit: Payer: Self-pay | Admitting: Family Medicine

## 2016-06-22 ENCOUNTER — Other Ambulatory Visit: Payer: Self-pay | Admitting: Family Medicine

## 2016-07-15 ENCOUNTER — Other Ambulatory Visit: Payer: Self-pay | Admitting: Family Medicine

## 2016-08-13 DIAGNOSIS — D225 Melanocytic nevi of trunk: Secondary | ICD-10-CM | POA: Diagnosis not present

## 2016-08-13 DIAGNOSIS — L821 Other seborrheic keratosis: Secondary | ICD-10-CM | POA: Diagnosis not present

## 2016-08-13 DIAGNOSIS — Z85820 Personal history of malignant melanoma of skin: Secondary | ICD-10-CM | POA: Diagnosis not present

## 2016-08-13 DIAGNOSIS — L814 Other melanin hyperpigmentation: Secondary | ICD-10-CM | POA: Diagnosis not present

## 2016-09-01 ENCOUNTER — Other Ambulatory Visit: Payer: Self-pay | Admitting: Family Medicine

## 2016-09-03 ENCOUNTER — Telehealth: Payer: Self-pay | Admitting: *Deleted

## 2016-09-03 MED ORDER — TRIAMTERENE-HCTZ 37.5-25 MG PO TABS
1.0000 | ORAL_TABLET | Freq: Every day | ORAL | 5 refills | Status: DC
Start: 2016-09-03 — End: 2016-09-09

## 2016-09-03 NOTE — Telephone Encounter (Signed)
Rx request from pharmacy received for Triamterene-HCTZ; Rx request to pharmacy/[facility] 12/05

## 2016-09-09 ENCOUNTER — Encounter: Payer: Self-pay | Admitting: Family Medicine

## 2016-09-09 ENCOUNTER — Ambulatory Visit (INDEPENDENT_AMBULATORY_CARE_PROVIDER_SITE_OTHER): Payer: BLUE CROSS/BLUE SHIELD | Admitting: Family Medicine

## 2016-09-09 VITALS — BP 122/76 | HR 63 | Temp 98.3°F | Ht 71.0 in | Wt 220.4 lb

## 2016-09-09 DIAGNOSIS — M8580 Other specified disorders of bone density and structure, unspecified site: Secondary | ICD-10-CM

## 2016-09-09 DIAGNOSIS — E559 Vitamin D deficiency, unspecified: Secondary | ICD-10-CM

## 2016-09-09 DIAGNOSIS — Z8739 Personal history of other diseases of the musculoskeletal system and connective tissue: Secondary | ICD-10-CM | POA: Diagnosis not present

## 2016-09-09 DIAGNOSIS — R3911 Hesitancy of micturition: Secondary | ICD-10-CM

## 2016-09-09 DIAGNOSIS — M109 Gout, unspecified: Secondary | ICD-10-CM

## 2016-09-09 DIAGNOSIS — Z0000 Encounter for general adult medical examination without abnormal findings: Secondary | ICD-10-CM

## 2016-09-09 DIAGNOSIS — E785 Hyperlipidemia, unspecified: Secondary | ICD-10-CM

## 2016-09-09 DIAGNOSIS — C6290 Malignant neoplasm of unspecified testis, unspecified whether descended or undescended: Secondary | ICD-10-CM

## 2016-09-09 DIAGNOSIS — K760 Fatty (change of) liver, not elsewhere classified: Secondary | ICD-10-CM | POA: Diagnosis not present

## 2016-09-09 DIAGNOSIS — E119 Type 2 diabetes mellitus without complications: Secondary | ICD-10-CM

## 2016-09-09 DIAGNOSIS — Z8547 Personal history of malignant neoplasm of testis: Secondary | ICD-10-CM

## 2016-09-09 DIAGNOSIS — C4441 Basal cell carcinoma of skin of scalp and neck: Secondary | ICD-10-CM

## 2016-09-09 DIAGNOSIS — R350 Frequency of micturition: Secondary | ICD-10-CM

## 2016-09-09 DIAGNOSIS — Z0001 Encounter for general adult medical examination with abnormal findings: Secondary | ICD-10-CM

## 2016-09-09 DIAGNOSIS — Z85828 Personal history of other malignant neoplasm of skin: Secondary | ICD-10-CM

## 2016-09-09 DIAGNOSIS — I10 Essential (primary) hypertension: Secondary | ICD-10-CM | POA: Diagnosis not present

## 2016-09-09 DIAGNOSIS — G4733 Obstructive sleep apnea (adult) (pediatric): Secondary | ICD-10-CM

## 2016-09-09 HISTORY — DX: Basal cell carcinoma of skin of scalp and neck: C44.41

## 2016-09-09 MED ORDER — TRIAMTERENE-HCTZ 37.5-25 MG PO TABS: 1.0000 | ORAL_TABLET | Freq: Every day | ORAL | 2 refills | Status: DC

## 2016-09-09 MED ORDER — GLUCOSE BLOOD VI STRP: ORAL_STRIP | 4 refills | Status: DC

## 2016-09-09 MED ORDER — LOSARTAN POTASSIUM 100 MG PO TABS: 100.0000 mg | ORAL_TABLET | Freq: Every day | ORAL | 2 refills | Status: DC

## 2016-09-09 MED ORDER — METFORMIN HCL 1000 MG PO TABS: 1000.0000 mg | ORAL_TABLET | Freq: Two times a day (BID) | ORAL | 2 refills | Status: DC

## 2016-09-09 MED ORDER — ATORVASTATIN CALCIUM 10 MG PO TABS: 10.0000 mg | ORAL_TABLET | Freq: Every day | ORAL | 2 refills | Status: DC

## 2016-09-09 NOTE — Assessment & Plan Note (Signed)
Encouraged heart healthy diet, increase exercise, avoid trans fats, consider a krill oil cap daily 

## 2016-09-09 NOTE — Assessment & Plan Note (Signed)
Check uric acid level 

## 2016-09-09 NOTE — Assessment & Plan Note (Signed)
Encouraged to get adequate exercise, calcium and vitamin d intake 

## 2016-09-09 NOTE — Assessment & Plan Note (Signed)
Has a Mohs procedure scheduled for tomorrow.

## 2016-09-09 NOTE — Assessment & Plan Note (Signed)
hgba1c acceptable, minimize simple carbs. Increase exercise as tolerated. Continue current meds 

## 2016-09-09 NOTE — Assessment & Plan Note (Signed)
Nocturia getting up roughly twice nightly, notes decrease in strength of stream. Worsening over past 1 to 1.5 years.

## 2016-09-09 NOTE — Progress Notes (Signed)
Pre visit review using our clinic review tool, if applicable. No additional management support is needed unless otherwise documented below in the visit note. 

## 2016-09-09 NOTE — Assessment & Plan Note (Signed)
Patient encouraged to maintain heart healthy diet, regular exercise, adequate sleep. Consider daily probiotics. . Given and reviewed copy of ACP documents from [place] of State and encouraged to complete and returnTake medications as prescribed

## 2016-09-09 NOTE — Assessment & Plan Note (Signed)
>>  ASSESSMENT AND PLAN FOR HTN (HYPERTENSION) WRITTEN ON 09/09/2016  2:17 PM BY BLYTH, STACEY A, MD  Well controlled, no changes to meds. Encouraged heart healthy diet such as the DASH diet and exercise as tolerated.

## 2016-09-09 NOTE — Assessment & Plan Note (Addendum)
Mild does not require CPAP, doing well

## 2016-09-09 NOTE — Patient Instructions (Signed)
NOW probiotic daily Preventive Care 40-64 Years, Male Preventive care refers to lifestyle choices and visits with your health care provider that can promote health and wellness. What does preventive care include?  A yearly physical exam. This is also called an annual well check.  Dental exams once or twice a year.  Routine eye exams. Ask your health care provider how often you should have your eyes checked.  Personal lifestyle choices, including:  Daily care of your teeth and gums.  Regular physical activity.  Eating a healthy diet.  Avoiding tobacco and drug use.  Limiting alcohol use.  Practicing safe sex.  Taking low-dose aspirin every day starting at age 51. What happens during an annual well check? The services and screenings done by your health care provider during your annual well check will depend on your age, overall health, lifestyle risk factors, and family history of disease. Counseling  Your health care provider may ask you questions about your:  Alcohol use.  Tobacco use.  Drug use.  Emotional well-being.  Home and relationship well-being.  Sexual activity.  Eating habits.  Work and work Statistician. Screening  You may have the following tests or measurements:  Height, weight, and BMI.  Blood pressure.  Lipid and cholesterol levels. These may be checked every 5 years, or more frequently if you are over 20 years old.  Skin check.  Lung cancer screening. You may have this screening every year starting at age 80 if you have a 30-pack-year history of smoking and currently smoke or have quit within the past 15 years.  Fecal occult blood test (FOBT) of the stool. You may have this test every year starting at age 24.  Flexible sigmoidoscopy or colonoscopy. You may have a sigmoidoscopy every 5 years or a colonoscopy every 10 years starting at age 76.  Prostate cancer screening. Recommendations will vary depending on your family history and other  risks.  Hepatitis C blood test.  Hepatitis B blood test.  Sexually transmitted disease (STD) testing.  Diabetes screening. This is done by checking your blood sugar (glucose) after you have not eaten for a while (fasting). You may have this done every 1-3 years. Discuss your test results, treatment options, and if necessary, the need for more tests with your health care provider. Vaccines  Your health care provider may recommend certain vaccines, such as:  Influenza vaccine. This is recommended every year.  Tetanus, diphtheria, and acellular pertussis (Tdap, Td) vaccine. You may need a Td booster every 10 years.  Varicella vaccine. You may need this if you have not been vaccinated.  Zoster vaccine. You may need this after age 69.  Measles, mumps, and rubella (MMR) vaccine. You may need at least one dose of MMR if you were born in 1957 or later. You may also need a second dose.  Pneumococcal 13-valent conjugate (PCV13) vaccine. You may need this if you have certain conditions and have not been vaccinated.  Pneumococcal polysaccharide (PPSV23) vaccine. You may need one or two doses if you smoke cigarettes or if you have certain conditions.  Meningococcal vaccine. You may need this if you have certain conditions.  Hepatitis A vaccine. You may need this if you have certain conditions or if you travel or work in places where you may be exposed to hepatitis A.  Hepatitis B vaccine. You may need this if you have certain conditions or if you travel or work in places where you may be exposed to hepatitis B.  Haemophilus influenzae  type b (Hib) vaccine. You may need this if you have certain risk factors. Talk to your health care provider about which screenings and vaccines you need and how often you need them. This information is not intended to replace advice given to you by your health care provider. Make sure you discuss any questions you have with your health care provider. Document  Released: 10/13/2015 Document Revised: 06/05/2016 Document Reviewed: 07/18/2015 Elsevier Interactive Patient Education  2017 [employer].

## 2016-09-09 NOTE — Progress Notes (Signed)
Patient ID: Steve Beasley, male   DOB: 1966-02-11, 50 y.o.   MRN: [id number]   Subjective:    Patient ID: Steve Beasley, male    DOB: Jun 16, 1966, 50 y.o.   MRN: [id number]  Chief Complaint  Patient presents with  . Annual Exam    HPI Patient is in today for annual preventative exam and follow up on numerous medical conditions. No recent illness or hospitalizations. Has recently had a Mohs procedure performed to remove a BCC from his scalp and has healed well. Denies polydipsia. Notes some polyuria. Is maintaining a heart healthy diet and increasing exercise. Denies CP/palp/SOB/HA/congestion/fevers/GI or GU c/o. Taking meds as prescribed. Notes some discomfort in right flank intermittently. Changes sometimes with position changes but not always.  Past Medical History:  Diagnosis Date  . Allergy-induced asthma   . Anxiety state, unspecified 09/19/2013  . Atypical chest pain 11/13/2014  . Basal cell carcinoma of scalp 09/09/2016  . Diabetes mellitus without complication ([place])   . Fatty liver disease, nonalcoholic [id number]  . Gout 09/18/2014  . Hypertension   . Nicotine use disorder 06/06/2013   smokeless  . Obesity, unspecified 06/06/2013  . Osteopenia   . Other and unspecified hyperlipidemia 01/19/2014  . Preventative health care 09/19/2013  . Right heart enlargement 09/18/2014  . Testicular cancer ([place])   . Valvular heart disease 09/18/2014    Past Surgical History:  Procedure Laterality Date  . BASAL CELL CARCINOMA EXCISION     Within 6 months  . SKIN CANCER EXCISION     Within 6 months  . testicular cancer surgery      Family History  Problem Relation Age of Onset  . Diabetes Mother   . Breast cancer Mother   . Bone cancer Mother   . Cancer Mother     breast with bone mets  . Depression Mother   . Hypertension Father   . Prostate cancer Father   . Hyperlipidemia Father   . Cancer Father     prostate  . Heart attack Neg Hx   . Sudden death Neg Hx   . Stroke  Paternal Grandfather   . Prostate cancer Maternal Grandfather   . Hypertension Brother   . Gallstones Brother   . Anxiety disorder Daughter   . ADD / ADHD Daughter   . Bulemia Daughter   . Depression Son   . Anxiety disorder Maternal Aunt     social anxiety, anger  . Stroke Maternal Uncle 67    Social History   Social History  . Marital status: Married    Spouse name: N/A  . Number of children: 2  . Years of education: N/A   Occupational History  . finance    Social History Main Topics  . Smoking status: Former Smoker    Types: Cigarettes  . Smokeless tobacco: Former Systems developer    Quit date: 02/28/2014     Comment: 30 years ago socially  . Alcohol use 0.0 oz/week  . Drug use: No  . Sexual activity: Yes     Comment: lives with wife, travels with work, no dietary restrictions , exercise regular   Other Topics Concern  . Not on file   Social History Narrative  . No narrative on file    Outpatient Medications Prior to Visit  Medication Sig Dispense Refill  . albuterol (PROVENTIL HFA;VENTOLIN HFA) 108 (90 BASE) MCG/ACT inhaler Inhale 2 puffs into the lungs every 6 (six) hours as needed for wheezing or shortness of breath.  1 Inhaler 2  . allopurinol (ZYLOPRIM) 100 MG tablet TAKE 1 TABLET(100 MG) BY MOUTH TWICE DAILY 180 tablet 0  . Cholecalciferol (VITAMIN D3) 5000 UNITS CAPS Take 1 capsule by mouth daily.    . metFORMIN (GLUCOPHAGE) 1000 MG tablet TAKE 1 TABLET BY MOUTH TWICE DAILY WITH MEALS 60 tablet 6  . Misc Natural Products (TART CHERRY ADVANCED) CAPS 2 (two) times daily. 250 mg    . NON FORMULARY curamin Takes 3 per day    . allopurinol (ZYLOPRIM) 100 MG tablet TAKE 1 TABLET(100 MG) BY MOUTH TWICE DAILY 180 tablet 0  . atorvastatin (LIPITOR) 10 MG tablet TAKE 1 TABLET BY MOUTH EVERY NIGHT AT BEDTIME 90 tablet 0  . glucose blood test strip CHECK BLOOD SUGAR DAILY AND PRN. 100 each 2  . losartan (COZAAR) 100 MG tablet TAKE 1 TABLET(100 MG) BY MOUTH DAILY 30 tablet 0  .  metFORMIN (GLUCOPHAGE) 1000 MG tablet TAKE 1 TABLET BY MOUTH TWICE DAILY WITH MEALS 180 tablet 0  . metFORMIN (GLUCOPHAGE) 1000 MG tablet TAKE 1 TABLET BY MOUTH TWICE DAILY WITH MEALS 180 tablet 0  . triamterene-hydrochlorothiazide (MAXZIDE-25) 37.5-25 MG tablet Take 1 tablet by mouth daily. 30 tablet 5   No facility-administered medications prior to visit.     No Known Allergies  Review of Systems  Constitutional: Negative for chills, fever and malaise/fatigue.  HENT: Negative for congestion and hearing loss.   Eyes: Negative for discharge.  Respiratory: Negative for cough, sputum production and shortness of breath.   Cardiovascular: Negative for chest pain, palpitations and leg swelling.  Gastrointestinal: Positive for abdominal pain. Negative for blood in stool, constipation, diarrhea, heartburn, nausea and vomiting.       Right flank discomfort off and on for 2 months  Genitourinary: Negative for dysuria, frequency, hematuria and urgency.  Musculoskeletal: Negative for back pain, falls and myalgias.  Skin: Negative for rash.  Neurological: Negative for dizziness, sensory change, loss of consciousness, weakness and headaches.  Endo/Heme/Allergies: Negative for environmental allergies. Does not bruise/bleed easily.  Psychiatric/Behavioral: Negative for depression and suicidal ideas. The patient is not nervous/anxious and does not have insomnia.        Objective:    Physical Exam  Constitutional: He is oriented to person, place, and time. He appears well-developed and well-nourished. No distress.  HENT:  Head: Normocephalic and atraumatic.  Eyes: Conjunctivae are normal.  Neck: Neck supple. No thyromegaly present.  Cardiovascular: Normal rate, regular rhythm and normal heart sounds.   No murmur heard. Pulmonary/Chest: Effort normal and breath sounds normal. No respiratory distress. He has no wheezes.  Abdominal: Soft. Bowel sounds are normal. He exhibits no mass. There is no  tenderness.  Musculoskeletal: He exhibits no edema.  Lymphadenopathy:    He has no cervical adenopathy.  Neurological: He is alert and oriented to person, place, and time.  Skin: Skin is warm and dry.  Psychiatric: He has a normal mood and affect. His behavior is normal.    BP 122/76 (BP Location: Left Arm, Patient Position: Sitting, Cuff Size: Normal)   Pulse 63   Temp 98.3 F (36.8 C) (Oral)   Ht 5\' 11"  (1.803 m)   Wt 220 lb 6 oz (100 kg)   SpO2 97%   BMI 30.74 kg/m  Wt Readings from Last 3 Encounters:  09/09/16 220 lb 6 oz (100 kg)  04/01/16 214 lb (97.1 kg)  11/23/15 235 lb 4 oz (106.7 kg)     Lab Results  Component Value Date  WBC 11.4 (H) 09/09/2016   HGB 14.7 09/09/2016   HCT 43.7 09/09/2016   PLT 160.0 09/09/2016   GLUCOSE 97 09/09/2016   CHOL 139 09/09/2016   TRIG 252.0 (H) 09/09/2016   HDL 39.80 09/09/2016   LDLDIRECT 72.0 09/09/2016   LDLCALC 52 03/27/2016   ALT 44 09/09/2016   AST 34 09/09/2016   NA 137 09/09/2016   K 4.0 09/09/2016   CL 101 09/09/2016   CREATININE 1.30 09/09/2016   BUN 17 09/09/2016   CO2 25 09/09/2016   TSH 0.54 09/09/2016   PSA 2.90 03/27/2016   HGBA1C 6.1 09/09/2016   MICROALBUR 1.3 08/21/2015    Lab Results  Component Value Date   TSH 0.54 09/09/2016   Lab Results  Component Value Date   WBC 11.4 (H) 09/09/2016   HGB 14.7 09/09/2016   HCT 43.7 09/09/2016   MCV 87.4 09/09/2016   PLT 160.0 09/09/2016   Lab Results  Component Value Date   NA 137 09/09/2016   K 4.0 09/09/2016   CO2 25 09/09/2016   GLUCOSE 97 09/09/2016   BUN 17 09/09/2016   CREATININE 1.30 09/09/2016   BILITOT 1.5 (H) 09/09/2016   ALKPHOS 83 09/09/2016   AST 34 09/09/2016   ALT 44 09/09/2016   PROT 7.3 09/09/2016   ALBUMIN 4.6 09/09/2016   CALCIUM 10.8 (H) 09/09/2016   GFR 61.75 09/09/2016   Lab Results  Component Value Date   CHOL 139 09/09/2016   Lab Results  Component Value Date   HDL 39.80 09/09/2016   Lab Results  Component  Value Date   LDLCALC 52 03/27/2016   Lab Results  Component Value Date   TRIG 252.0 (H) 09/09/2016   Lab Results  Component Value Date   CHOLHDL 3 09/09/2016   Lab Results  Component Value Date   HGBA1C 6.1 09/09/2016       Assessment & Plan:   Problem List Items Addressed This Visit    Testicular cancer ([place])    Referred back to urology due to urinary hesitancy and frequency      DM (diabetes mellitus), type 2 ([place])    hgba1c acceptable, minimize simple carbs. Increase exercise as tolerated. Continue current meds      Relevant Medications   metFORMIN (GLUCOPHAGE) 1000 MG tablet   losartan (COZAAR) 100 MG tablet   atorvastatin (LIPITOR) 10 MG tablet   Other Relevant Orders   Hemoglobin A1c (Completed)   HTN (hypertension)    Well controlled, no changes to meds. Encouraged heart healthy diet such as the DASH diet and exercise as tolerated.       Relevant Medications   losartan (COZAAR) 100 MG tablet   atorvastatin (LIPITOR) 10 MG tablet   triamterene-hydrochlorothiazide (MAXZIDE-25) 37.5-25 MG tablet   Other Relevant Orders   CBC (Completed)   Comprehensive metabolic panel (Completed)   TSH (Completed)   Osteopenia    Encouraged to get adequate exercise, calcium and vitamin d intake      Vitamin D deficiency    Check level today.      Relevant Orders   Vitamin D (25 hydroxy) (Completed)   Preventative health care    Patient encouraged to maintain heart healthy diet, regular exercise, adequate sleep. Consider daily probiotics. . Given and reviewed copy of ACP documents from [place] of State and encouraged to complete and returnTake medications as prescribed      Fatty liver disease, nonalcoholic    Intermittent RUQ pain after eating fatty meals. Minimize  fatty and spicy foods and consider repeat ultrasound of abdomen if symptoms continue to escalate.       Hyperlipidemia    Encouraged heart healthy diet, increase exercise, avoid trans fats, consider  a krill oil cap daily       Relevant Medications   losartan (COZAAR) 100 MG tablet   atorvastatin (LIPITOR) 10 MG tablet   triamterene-hydrochlorothiazide (MAXZIDE-25) 37.5-25 MG tablet   Other Relevant Orders   Lipid panel (Completed)   Urinary frequency    Nocturia getting up roughly twice nightly, notes decrease in strength of stream. Worsening over past 1 to 1.5 years.       Relevant Orders   Urine culture (Completed)   Urinalysis (Completed)   Gout    Check uric acid level      Relevant Orders   Uric acid (Completed)   OSA (obstructive sleep apnea)    Mild does not require CPAP, doing well      Basal cell carcinoma of scalp    Has a Mohs procedure scheduled for tomorrow.        Other Visit Diagnoses    Urinary hesitancy    -  Primary   Relevant Orders   Ambulatory referral to Urology   Urine culture (Completed)   Urinalysis (Completed)      I have changed Mr. Prestia's metFORMIN, losartan, and atorvastatin. I am also having him maintain his albuterol, Vitamin D3, TART CHERRY ADVANCED, NON FORMULARY, metFORMIN, allopurinol, glucose blood, and triamterene-hydrochlorothiazide.  Meds ordered this encounter  Medications  . metFORMIN (GLUCOPHAGE) 1000 MG tablet    Sig: Take 1 tablet (1,000 mg total) by mouth 2 (two) times daily with a meal.    Dispense:  180 tablet    Refill:  2  . losartan (COZAAR) 100 MG tablet    Sig: Take 1 tablet (100 mg total) by mouth daily.    Dispense:  90 tablet    Refill:  2  . atorvastatin (LIPITOR) 10 MG tablet    Sig: Take 1 tablet (10 mg total) by mouth at bedtime.    Dispense:  90 tablet    Refill:  2  . glucose blood test strip    Sig: CHECK BLOOD SUGAR DAILY AND PRN.    Dispense:  100 each    Refill:  4    ONE TOUCH ULTRA BLUE TEST STRIPS.  Marland Kitchen triamterene-hydrochlorothiazide (MAXZIDE-25) 37.5-25 MG tablet    Sig: Take 1 tablet by mouth daily.    Dispense:  90 tablet    Refill:  2     Penni Homans, MD

## 2016-09-09 NOTE — Assessment & Plan Note (Signed)
Intermittent RUQ pain after eating fatty meals. Minimize fatty and spicy foods and consider repeat ultrasound of abdomen if symptoms continue to escalate.

## 2016-09-09 NOTE — Assessment & Plan Note (Signed)
Referred back to urology due to urinary hesitancy and frequency

## 2016-09-09 NOTE — Assessment & Plan Note (Signed)
Well controlled, no changes to meds. Encouraged heart healthy diet such as the DASH diet and exercise as tolerated.  °

## 2016-09-09 NOTE — Assessment & Plan Note (Signed)
Check level today 

## 2016-09-10 DIAGNOSIS — C4441 Basal cell carcinoma of skin of scalp and neck: Secondary | ICD-10-CM | POA: Diagnosis not present

## 2016-09-10 LAB — COMPREHENSIVE METABOLIC PANEL
ALT: 44 U/L (ref 0–53)
AST: 34 U/L (ref 0–37)
Albumin: 4.6 g/dL (ref 3.5–5.2)
Alkaline Phosphatase: 83 U/L (ref 39–117)
BUN: 17 mg/dL (ref 6–23)
CHLORIDE: 101 meq/L (ref 96–112)
CO2: 25 meq/L (ref 19–32)
CREATININE: 1.3 mg/dL (ref 0.40–1.50)
Calcium: 10.8 mg/dL — ABNORMAL HIGH (ref 8.4–10.5)
GFR: 61.75 mL/min (ref >60.00)
Glucose, Bld: 97 mg/dL (ref 70–99)
Potassium: 4 mEq/L (ref 3.5–5.1)
SODIUM: 137 meq/L (ref 135–145)
Total Bilirubin: 1.5 mg/dL — ABNORMAL HIGH (ref 0.2–1.2)
Total Protein: 7.3 g/dL (ref 6.0–8.3)

## 2016-09-10 LAB — URINALYSIS
Bilirubin Urine: NEGATIVE
Hgb urine dipstick: NEGATIVE
KETONES UR: NEGATIVE
Leukocytes, UA: NEGATIVE
Nitrite: NEGATIVE
PH: 6.5 (ref 5.0–8.0)
SPECIFIC GRAVITY, URINE: 1.01 (ref 1.000–1.030)
TOTAL PROTEIN, URINE-UPE24: NEGATIVE
URINE GLUCOSE: NEGATIVE
Urobilinogen, UA: 0.2 (ref 0.0–1.0)

## 2016-09-10 LAB — LIPID PANEL
CHOL/HDL RATIO: 3
Cholesterol: 139 mg/dL (ref 0–200)
HDL: 39.8 mg/dL (ref >39.00)
NONHDL: 99.08
Triglycerides: 252 mg/dL — ABNORMAL HIGH (ref 0.0–149.0)
VLDL: 50.4 mg/dL — AB (ref 0.0–40.0)

## 2016-09-10 LAB — HEMOGLOBIN A1C: HEMOGLOBIN A1C: 6.1 % (ref 4.6–6.5)

## 2016-09-10 LAB — CBC
HCT: 43.7 % (ref 39.0–52.0)
Hemoglobin: 14.7 g/dL (ref 13.0–17.0)
MCHC: 33.7 g/dL (ref 30.0–36.0)
MCV: 87.4 fl (ref 78.0–100.0)
Platelets: 160 10*3/uL (ref 150.0–400.0)
RBC: 4.99 Mil/uL (ref 4.22–5.81)
RDW: 13.4 % (ref 11.5–15.5)
WBC: 11.4 10*3/uL — ABNORMAL HIGH (ref 4.0–10.5)

## 2016-09-10 LAB — TSH: TSH: 0.54 u[IU]/mL (ref 0.35–4.50)

## 2016-09-10 LAB — URIC ACID: Uric Acid, Serum: 6.1 mg/dL (ref 4.0–7.8)

## 2016-09-10 LAB — VITAMIN D 25 HYDROXY (VIT D DEFICIENCY, FRACTURES): VITD: 22.79 ng/mL — ABNORMAL LOW (ref 30.00–100.00)

## 2016-09-10 LAB — LDL CHOLESTEROL, DIRECT: Direct LDL: 72 mg/dL

## 2016-09-10 LAB — URINE CULTURE: Organism ID, Bacteria: NO GROWTH

## 2016-09-17 ENCOUNTER — Other Ambulatory Visit: Payer: Self-pay | Admitting: Family Medicine

## 2016-09-23 ENCOUNTER — Other Ambulatory Visit: Payer: Self-pay | Admitting: Family Medicine

## 2016-10-30 ENCOUNTER — Other Ambulatory Visit: Payer: Self-pay | Admitting: Family Medicine

## 2017-01-01 DIAGNOSIS — L814 Other melanin hyperpigmentation: Secondary | ICD-10-CM | POA: Diagnosis not present

## 2017-01-01 DIAGNOSIS — Z85820 Personal history of malignant melanoma of skin: Secondary | ICD-10-CM | POA: Diagnosis not present

## 2017-01-01 DIAGNOSIS — D485 Neoplasm of uncertain behavior of skin: Secondary | ICD-10-CM | POA: Diagnosis not present

## 2017-01-01 DIAGNOSIS — D1800 Hemangioma unspecified site: Secondary | ICD-10-CM | POA: Diagnosis not present

## 2017-01-01 DIAGNOSIS — L821 Other seborrheic keratosis: Secondary | ICD-10-CM | POA: Diagnosis not present

## 2017-01-01 DIAGNOSIS — L570 Actinic keratosis: Secondary | ICD-10-CM | POA: Diagnosis not present

## 2017-01-03 DIAGNOSIS — R3912 Poor urinary stream: Secondary | ICD-10-CM | POA: Diagnosis not present

## 2017-01-03 DIAGNOSIS — R350 Frequency of micturition: Secondary | ICD-10-CM | POA: Diagnosis not present

## 2017-01-03 DIAGNOSIS — R351 Nocturia: Secondary | ICD-10-CM | POA: Diagnosis not present

## 2017-01-28 DIAGNOSIS — Z8547 Personal history of malignant neoplasm of testis: Secondary | ICD-10-CM | POA: Diagnosis not present

## 2017-02-26 IMAGING — NM NM MYOCAR MULTI W/ SPECT
3 series · 18 of 18 positions shown · non-contrast
Comparison: none

[Series 1: rest_(id)_sa · 6.5mm · 6.51mm/px · 6 of 64 frames shown]
[frame 6/64]
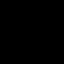
[frame 16/64]
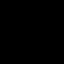
[frame 27/64]
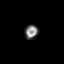
[frame 38/64]
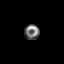
[frame 48/64]
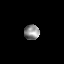
[frame 59/64]
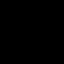

[Series 1: stress_(id)_sa · 6.5mm · 6.51mm/px · 6 of 512 frames shown (1 of 2)]
[frame 43/512]
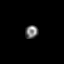
[frame 128/512]
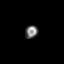
[frame 214/512]
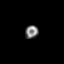
[frame 299/512]
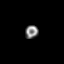
[frame 384/512]
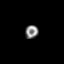
[frame 470/512]
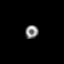

[Series 1: stress_(id)_sa · 6.5mm · 6.51mm/px · 6 of 64 frames shown (2 of 2)]
[frame 6/64]
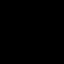
[frame 16/64]
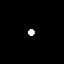
[frame 27/64]
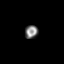
[frame 38/64]
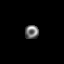
[frame 48/64]
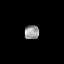
[frame 59/64]
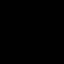

[18 of 18 positions shown; findings below may reference images not displayed]

Canned report from images found in remote index.

Refer to host system for actual result text.

## 2017-03-24 ENCOUNTER — Encounter: Payer: Self-pay | Admitting: Family Medicine

## 2017-03-24 ENCOUNTER — Ambulatory Visit (INDEPENDENT_AMBULATORY_CARE_PROVIDER_SITE_OTHER): Payer: BLUE CROSS/BLUE SHIELD | Admitting: Family Medicine

## 2017-03-24 VITALS — BP 152/80 | HR 65 | Temp 98.4°F | Resp 18 | Ht 71.0 in | Wt 230.8 lb

## 2017-03-24 DIAGNOSIS — E559 Vitamin D deficiency, unspecified: Secondary | ICD-10-CM | POA: Diagnosis not present

## 2017-03-24 DIAGNOSIS — H9313 Tinnitus, bilateral: Secondary | ICD-10-CM

## 2017-03-24 DIAGNOSIS — F17209 Nicotine dependence, unspecified, with unspecified nicotine-induced disorders: Secondary | ICD-10-CM | POA: Diagnosis not present

## 2017-03-24 DIAGNOSIS — J45909 Unspecified asthma, uncomplicated: Secondary | ICD-10-CM | POA: Diagnosis not present

## 2017-03-24 DIAGNOSIS — E119 Type 2 diabetes mellitus without complications: Secondary | ICD-10-CM

## 2017-03-24 DIAGNOSIS — E785 Hyperlipidemia, unspecified: Secondary | ICD-10-CM

## 2017-03-24 DIAGNOSIS — E669 Obesity, unspecified: Secondary | ICD-10-CM | POA: Diagnosis not present

## 2017-03-24 DIAGNOSIS — I10 Essential (primary) hypertension: Secondary | ICD-10-CM | POA: Diagnosis not present

## 2017-03-24 DIAGNOSIS — M1000 Idiopathic gout, unspecified site: Secondary | ICD-10-CM

## 2017-03-24 DIAGNOSIS — J209 Acute bronchitis, unspecified: Secondary | ICD-10-CM

## 2017-03-24 DIAGNOSIS — H9319 Tinnitus, unspecified ear: Secondary | ICD-10-CM

## 2017-03-24 DIAGNOSIS — M8580 Other specified disorders of bone density and structure, unspecified site: Secondary | ICD-10-CM | POA: Diagnosis not present

## 2017-03-24 HISTORY — DX: Tinnitus, unspecified ear: H93.19

## 2017-03-24 MED ORDER — GLUCOSE BLOOD VI STRP: ORAL_STRIP | 4 refills | Status: DC

## 2017-03-24 MED ORDER — COLCHICINE 0.6 MG PO TABS: 0.6000 mg | ORAL_TABLET | ORAL | 1 refills | Status: DC | PRN

## 2017-03-24 MED ORDER — AMLODIPINE BESYLATE 2.5 MG PO TABS: 2.5000 mg | ORAL_TABLET | Freq: Every day | ORAL | 0 refills | Status: DC

## 2017-03-24 MED ORDER — ALBUTEROL SULFATE HFA 108 (90 BASE) MCG/ACT IN AERS: 2.0000 | INHALATION_SPRAY | Freq: Four times a day (QID) | RESPIRATORY_TRACT | 2 refills | Status: DC | PRN

## 2017-03-24 NOTE — Assessment & Plan Note (Addendum)
B/l and slow progression of hearing loss is ready for referral to ENT to further evaluate he wants to see the same one his wife sess, he will let us know and then we will refer.

## 2017-03-24 NOTE — Patient Instructions (Addendum)
[employer] nasal saline, probiotic at [email or web address]  Recommend calcium intake of 1200 to 1500 mg daily, divided into roughly 3 doses. Best source is the diet and a single dairy serving is about 500 mg, a supplement of calcium citrate once or twice daily to balance diet is fine if not getting enough in diet. Also need Vitamin D 2000 IU caps, 1 cap daily if not already taking vitamin D. Also recommend weight baring exercise on hips and upper body to keep bones strong  Gout Gout is painful swelling that can occur in some of your joints. Gout is a type of arthritis. This condition is caused by having too much uric acid in your body. Uric acid is a chemical that forms when your body breaks down substances called purines. Purines are important for building body proteins. When your body has too much uric acid, sharp crystals can form and build up inside your joints. This causes pain and swelling. Gout attacks can happen quickly and be very painful (acute gout). Over time, the attacks can affect more joints and become more frequent (chronic gout). Gout can also cause uric acid to build up under your skin and inside your kidneys. What are the causes? This condition is caused by too much uric acid in your blood. This can occur because:  Your kidneys do not remove enough uric acid from your blood. This is the most common cause.  Your body makes too much uric acid. This can occur with some cancers and cancer treatments. It can also occur if your body is breaking down too many red blood cells (hemolytic anemia).  You eat too many foods that are high in purines. These foods include organ meats and some seafood. Alcohol, especially beer, is also high in purines.  A gout attack may be triggered by trauma or stress. What increases the risk? This condition is more likely to develop in people who:  Have a family history of gout.  Are male and middle-aged.  Are male and have gone through menopause.  Are  obese.  Frequently drink alcohol, especially beer.  Are dehydrated.  Lose weight too quickly.  Have an organ transplant.  Have lead poisoning.  Take certain medicines, including aspirin, cyclosporine, diuretics, levodopa, and niacin.  Have kidney disease or psoriasis.  What are the signs or symptoms? An attack of acute gout happens quickly. It usually occurs in just one joint. The most common place is the big toe. Attacks often start at night. Other joints that may be affected include joints of the feet, ankle, knee, fingers, wrist, or elbow. Symptoms may include:  Severe pain.  Warmth.  Swelling.  Stiffness.  Tenderness. The affected joint may be very painful to touch.  Shiny, red, or purple skin.  Chills and fever.  Chronic gout may cause symptoms more frequently. More joints may be involved. You may also have white or yellow lumps (tophi) on your hands or feet or in other areas near your joints. How is this diagnosed? This condition is diagnosed based on your symptoms, medical history, and physical exam. You may have tests, such as:  Blood tests to measure uric acid levels.  Removal of joint fluid with a needle (aspiration) to look for uric acid crystals.  X-rays to look for joint damage.  How is this treated? Treatment for this condition has two phases: treating an acute attack and preventing future attacks. Acute gout treatment may include medicines to reduce pain and swelling, including:  NSAIDs.  Steroids.  These are strong anti-inflammatory medicines that can be taken by mouth (orally) or injected into a joint.  Colchicine. This medicine relieves pain and swelling when it is taken soon after an attack. It can be given orally or through an IV tube.  Preventive treatment may include:  Daily use of smaller doses of NSAIDs or colchicine.  Use of a medicine that reduces uric acid levels in your blood.  Changes to your diet. You may need to see a specialist  about healthy eating (dietitian).  Follow these instructions at home: During a Gout Attack  If directed, apply ice to the affected area: ? Put ice in a plastic bag. ? Place a towel between your skin and the bag. ? Leave the ice on for 20 minutes, 2-3 times a day.  Rest the joint as much as possible. If the affected joint is in your leg, you may be given crutches to use.  Raise (elevate) the affected joint above the level of your heart as often as possible.  Drink enough fluids to keep your urine clear or pale yellow.  Take over-the-counter and prescription medicines only as told by your health care provider.  Do not drive or operate heavy machinery while taking prescription pain medicine.  Follow instructions from your health care provider about eating or drinking restrictions.  Return to your normal activities as told by your health care provider. Ask your health care provider what activities are safe for you. Avoiding Future Gout Attacks  Follow a low-purine diet as told by your dietitian or health care provider. Avoid foods and drinks that are high in purines, including liver, kidney, anchovies, asparagus, herring, mushrooms, mussels, and beer.  Limit alcohol intake to no more than 1 drink a day for nonpregnant women and 2 drinks a day for men. One drink equals 12 oz of beer, 5 oz of wine, or 1 oz of hard liquor.  Maintain a healthy weight or lose weight if you are overweight. If you want to lose weight, talk with your health care provider. It is important that you do not lose weight too quickly.  Start or maintain an exercise program as told by your health care provider.  Drink enough fluids to keep your urine clear or pale yellow.  Take over-the-counter and prescription medicines only as told by your health care provider.  Keep all follow-up visits as told by your health care provider. This is important. Contact a health care provider if:  You have another gout  attack.  You continue to have symptoms of a gout attack after10 days of treatment.  You have side effects from your medicines.  You have chills or a fever.  You have burning pain when you urinate.  You have pain in your lower back or belly. Get help right away if:  You have severe or uncontrolled pain.  You cannot urinate. This information is not intended to replace advice given to you by your health care provider. Make sure you discuss any questions you have with your health care provider. Document Released: 09/13/2000 Document Revised: 02/22/2016 Document Reviewed: 06/29/2015 Elsevier Interactive Patient Education  2017 [employer].

## 2017-03-24 NOTE — Assessment & Plan Note (Signed)
Takes 5000 mg daily recheck

## 2017-03-24 NOTE — Assessment & Plan Note (Signed)
hgba1c acceptable, minimize simple carbs. Increase exercise as tolerated.  

## 2017-03-24 NOTE — Progress Notes (Signed)
Subjective:  I acted as a Education administrator for Dr. Charlett Blake. [place], [place]  Patient ID: Steve Beasley, male    DOB: 1966-08-02, 51 y.o.   MRN: [id number]  Chief Complaint  Patient presents with  . Follow-up    HPI  Patient is in today for a follow up. Patient c/o ringing in his ears, he states he has noticed some hearing loss.  No recent febrile illness or acute hospitalizations. Denies CP/palp/SOB/HA/congestion/fevers/GI or GU c/o. Taking meds as prescribed. He is acknowledging he has been struggling with hearing loss and tinnitus for years but it continues to worsen. He is willing to proceed with work up. He acknowledges he has not been exercising or eating a heart healthy diet regularly.   Patient Care Team: Mosie Lukes, MD as PCP - General (Family Medicine)   Past Medical History:  Diagnosis Date  . Allergy-induced asthma   . Anxiety state, unspecified 09/19/2013  . Atypical chest pain 11/13/2014  . Basal cell carcinoma of scalp 09/09/2016  . Diabetes mellitus without complication ([place])   . Fatty liver disease, nonalcoholic 97/10/6376  . Gout 09/18/2014  . Hypertension   . Nicotine use disorder 06/06/2013   smokeless  . Obesity, unspecified 06/06/2013  . Osteopenia   . Other and unspecified hyperlipidemia 01/19/2014  . Preventative health care 09/19/2013  . Right heart enlargement 09/18/2014  . Testicular cancer ([place])   . Tinnitus 03/24/2017  . Valvular heart disease 09/18/2014    Past Surgical History:  Procedure Laterality Date  . BASAL CELL CARCINOMA EXCISION     Within 6 months  . SKIN CANCER EXCISION     Within 6 months  . testicular cancer surgery      Family History  Problem Relation Age of Onset  . Diabetes Mother   . Breast cancer Mother   . Bone cancer Mother   . Cancer Mother        breast with bone mets  . Depression Mother   . Hypertension Father   . Prostate cancer Father   . Hyperlipidemia Father   . Cancer Father        prostate  . Heart attack  Neg Hx   . Sudden death Neg Hx   . Stroke Paternal Grandfather   . Prostate cancer Maternal Grandfather   . Hypertension Brother   . Gallstones Brother   . Anxiety disorder Daughter   . ADD / ADHD Daughter   . Bulemia Daughter   . Depression Son   . Anxiety disorder Maternal Aunt        social anxiety, anger  . Stroke Maternal Uncle 54    Social History   Social History  . Marital status: Married    Spouse name: N/A  . Number of children: 2  . Years of education: N/A   Occupational History  . finance    Social History Main Topics  . Smoking status: Former Smoker    Types: Cigarettes  . Smokeless tobacco: Former Systems developer    Quit date: 02/28/2014     Comment: 30 years ago socially  . Alcohol use 0.0 oz/week  . Drug use: No  . Sexual activity: Yes     Comment: lives with wife, travels with work, no dietary restrictions , exercise regular   Other Topics Concern  . Not on file   Social History Narrative  . No narrative on file    Outpatient Medications Prior to Visit  Medication Sig Dispense Refill  . allopurinol (ZYLOPRIM)  100 MG tablet TAKE 1 TABLET(100 MG) BY MOUTH TWICE DAILY 180 tablet 1  . atorvastatin (LIPITOR) 10 MG tablet TAKE 1 TABLET BY MOUTH EVERY NIGHT AT BEDTIME 90 tablet 0  . Cholecalciferol (VITAMIN D3) 5000 UNITS CAPS Take 1 capsule by mouth daily.    Marland Kitchen losartan (COZAAR) 100 MG tablet Take 1 tablet (100 mg total) by mouth daily. 90 tablet 2  . metFORMIN (GLUCOPHAGE) 1000 MG tablet TAKE 1 TABLET BY MOUTH TWICE DAILY WITH MEALS 60 tablet 6  . metFORMIN (GLUCOPHAGE) 1000 MG tablet TAKE 1 TABLET BY MOUTH TWICE DAILY WITH MEALS 180 tablet 1  . Misc Natural Products (TART CHERRY ADVANCED) CAPS 2 (two) times daily. 250 mg    . NON FORMULARY curamin Takes 3 per day    . triamterene-hydrochlorothiazide (MAXZIDE-25) 37.5-25 MG tablet Take 1 tablet by mouth daily. 90 tablet 2  . albuterol (PROVENTIL HFA;VENTOLIN HFA) 108 (90 BASE) MCG/ACT inhaler Inhale 2 puffs into  the lungs every 6 (six) hours as needed for wheezing or shortness of breath. 1 Inhaler 2  . glucose blood test strip CHECK BLOOD SUGAR DAILY AND PRN. 100 each 4  . allopurinol (ZYLOPRIM) 100 MG tablet TAKE 1 TABLET(100 MG) BY MOUTH TWICE DAILY 180 tablet 0  . atorvastatin (LIPITOR) 10 MG tablet Take 1 tablet (10 mg total) by mouth at bedtime. 90 tablet 2  . metFORMIN (GLUCOPHAGE) 1000 MG tablet Take 1 tablet (1,000 mg total) by mouth 2 (two) times daily with a meal. 180 tablet 2   No facility-administered medications prior to visit.     No Known Allergies  Review of Systems  Constitutional: Negative for fever and malaise/fatigue.  HENT: Positive for hearing loss and tinnitus. Negative for congestion, ear discharge and ear pain.   Eyes: Negative for blurred vision.  Respiratory: Negative for cough and shortness of breath.   Cardiovascular: Negative for chest pain, palpitations and leg swelling.  Gastrointestinal: Negative for vomiting.  Musculoskeletal: Negative for back pain.  Skin: Negative for rash.  Neurological: Negative for loss of consciousness and headaches.       Objective:    Physical Exam  Constitutional: He is oriented to person, place, and time. He appears well-developed and well-nourished. No distress.  HENT:  Head: Normocephalic and atraumatic.  Eyes: Conjunctivae are normal.  Neck: Normal range of motion. No thyromegaly present.  Cardiovascular: Normal rate and regular rhythm.   Pulmonary/Chest: Effort normal and breath sounds normal. He has no wheezes.  Abdominal: Soft. Bowel sounds are normal. There is no tenderness.  Musculoskeletal: Normal range of motion. He exhibits no edema or deformity.  Neurological: He is alert and oriented to person, place, and time.  Skin: Skin is warm and dry. He is not diaphoretic.  Psychiatric: He has a normal mood and affect.    BP (!) 152/80 (BP Location: Left Arm, Patient Position: Sitting, Cuff Size: Normal)   Pulse 65    Temp 98.4 F (36.9 C) (Oral)   Resp 18   Ht 5\' 11"  (1.803 m)   Wt 230 lb 12.8 oz (104.7 kg)   SpO2 99%   BMI 32.19 kg/m  Wt Readings from Last 3 Encounters:  03/24/17 230 lb 12.8 oz (104.7 kg)  09/09/16 220 lb 6 oz (100 kg)  04/01/16 214 lb (97.1 kg)   BP Readings from Last 3 Encounters:  03/24/17 (!) 152/80  09/09/16 122/76  04/01/16 120/72     Immunization History  Administered Date(s) Administered  . Tdap 01/17/2014  Health Maintenance  Topic Date Due  . PNEUMOCOCCAL POLYSACCHARIDE VACCINE (1) 04/07/1967  . FOOT EXAM  04/07/1975  . HIV Screening  04/06/1980  . COLONOSCOPY  04/07/2015  . OPHTHALMOLOGY EXAM  10/04/2016  . HEMOGLOBIN A1C  03/10/2017  . INFLUENZA VACCINE  04/30/2017  . TETANUS/TDAP  01/18/2024    Lab Results  Component Value Date   WBC 11.4 (H) 09/09/2016   HGB 14.7 09/09/2016   HCT 43.7 09/09/2016   PLT 160.0 09/09/2016   GLUCOSE 97 09/09/2016   CHOL 139 09/09/2016   TRIG 252.0 (H) 09/09/2016   HDL 39.80 09/09/2016   LDLDIRECT 72.0 09/09/2016   LDLCALC 52 03/27/2016   ALT 44 09/09/2016   AST 34 09/09/2016   NA 137 09/09/2016   K 4.0 09/09/2016   CL 101 09/09/2016   CREATININE 1.30 09/09/2016   BUN 17 09/09/2016   CO2 25 09/09/2016   TSH 0.54 09/09/2016   PSA 2.90 03/27/2016   HGBA1C 6.1 09/09/2016   MICROALBUR 1.3 08/21/2015    Lab Results  Component Value Date   TSH 0.54 09/09/2016   Lab Results  Component Value Date   WBC 11.4 (H) 09/09/2016   HGB 14.7 09/09/2016   HCT 43.7 09/09/2016   MCV 87.4 09/09/2016   PLT 160.0 09/09/2016   Lab Results  Component Value Date   NA 137 09/09/2016   K 4.0 09/09/2016   CO2 25 09/09/2016   GLUCOSE 97 09/09/2016   BUN 17 09/09/2016   CREATININE 1.30 09/09/2016   BILITOT 1.5 (H) 09/09/2016   ALKPHOS 83 09/09/2016   AST 34 09/09/2016   ALT 44 09/09/2016   PROT 7.3 09/09/2016   ALBUMIN 4.6 09/09/2016   CALCIUM 10.8 (H) 09/09/2016   GFR 61.75 09/09/2016   Lab Results    Component Value Date   CHOL 139 09/09/2016   Lab Results  Component Value Date   HDL 39.80 09/09/2016   Lab Results  Component Value Date   LDLCALC 52 03/27/2016   Lab Results  Component Value Date   TRIG 252.0 (H) 09/09/2016   Lab Results  Component Value Date   CHOLHDL 3 09/09/2016   Lab Results  Component Value Date   HGBA1C 6.1 09/09/2016         Assessment & Plan:   Problem List Items Addressed This Visit    DM (diabetes mellitus), type 2 ([place])    hgba1c acceptable, minimize simple carbs. Increase exercise as tolerated.      Relevant Orders   Comprehensive metabolic panel   Hemoglobin A1c   Hemoglobin A1c   HTN (hypertension)   Relevant Medications   amLODipine (NORVASC) 2.5 MG tablet   Other Relevant Orders   Comprehensive metabolic panel   CBC   TSH   CBC   Comprehensive metabolic panel   TSH   Osteopenia    Encouraged to get adequate exercise, calcium and vitamin d intake      Vitamin D deficiency    Takes 5000 mg daily recheck       Relevant Orders   VITAMIN D 25 Hydroxy (Vit-D Deficiency, Fractures)   VITAMIN D 25 Hydroxy (Vit-D Deficiency, Fractures)   Obesity    Encouraged DASH diet, decrease po intake and increase exercise as tolerated. Needs 7-8 hours of sleep nightly. Avoid trans fats, eat small, frequent meals every 4-5 hours with lean proteins, complex carbs and healthy fats. Minimize simple carbs, bariatric referral      Hyperlipidemia - Primary   Relevant  Medications   amLODipine (NORVASC) 2.5 MG tablet   Other Relevant Orders   Lipid panel   Lipid panel   Gout   Relevant Orders   Uric acid   Uric acid   Tinnitus    B/l and slow progression of hearing loss is ready for referral to ENT to further evaluate he wants to see the same one his wife sess, he will let us know and then we will refer.       Other Visit Diagnoses    Uncomplicated asthma, unspecified asthma severity, unspecified whether persistent       Relevant  Medications   albuterol (PROVENTIL HFA;VENTOLIN HFA) 108 (90 Base) MCG/ACT inhaler   Acute bronchitis, unspecified organism       Nicotine dependence with nicotine-induced disorder, unspecified nicotine product type          I have discontinued Mr. Findling's albuterol. I have also changed his colchicine. Additionally, I am having him start on albuterol and amLODipine. Lastly, I am having him maintain his Vitamin D3, TART CHERRY ADVANCED, NON FORMULARY, metFORMIN, losartan, triamterene-hydrochlorothiazide, metFORMIN, allopurinol, atorvastatin, and glucose blood.  Meds ordered this encounter  Medications  . glucose blood test strip    Sig: CHECK BLOOD SUGAR DAILY AND PRN.    Dispense:  100 each    Refill:  4    ONE TOUCH ULTRA BLUE TEST STRIPS.  Marland Kitchen albuterol (PROVENTIL HFA;VENTOLIN HFA) 108 (90 Base) MCG/ACT inhaler    Sig: Inhale 2 puffs into the lungs every 6 (six) hours as needed for wheezing or shortness of breath.    Dispense:  1 Inhaler    Refill:  2  . colchicine 0.6 MG tablet    Sig: Take 1 tablet (0.6 mg total) by mouth as needed. Take 2 by mouth,    Dispense:  30 tablet    Refill:  1  . amLODipine (NORVASC) 2.5 MG tablet    Sig: Take 1 tablet (2.5 mg total) by mouth daily.    Dispense:  90 tablet    Refill:  0    CMA served as scribe during this visit. History, Physical and Plan performed by medical provider. Documentation and orders reviewed and attested to.  Penni Homans, MD

## 2017-03-24 NOTE — Assessment & Plan Note (Signed)
Encouraged to get adequate exercise, calcium and vitamin d intake 

## 2017-03-24 NOTE — Assessment & Plan Note (Signed)
Encouraged DASH diet, decrease po intake and increase exercise as tolerated. Needs 7-8 hours of sleep nightly. Avoid trans fats, eat small, frequent meals every 4-5 hours with lean proteins, complex carbs and healthy fats. Minimize simple carbs, bariatric referral 

## 2017-03-25 ENCOUNTER — Other Ambulatory Visit: Payer: Self-pay | Admitting: Family Medicine

## 2017-03-25 ENCOUNTER — Other Ambulatory Visit: Payer: Self-pay

## 2017-03-25 MED ORDER — METFORMIN HCL 1000 MG PO TABS: 1000.0000 mg | ORAL_TABLET | Freq: Two times a day (BID) | ORAL | 1 refills | Status: DC

## 2017-04-28 ENCOUNTER — Other Ambulatory Visit: Payer: Self-pay | Admitting: Family Medicine

## 2017-05-07 ENCOUNTER — Other Ambulatory Visit (INDEPENDENT_AMBULATORY_CARE_PROVIDER_SITE_OTHER): Payer: BLUE CROSS/BLUE SHIELD

## 2017-05-07 DIAGNOSIS — E559 Vitamin D deficiency, unspecified: Secondary | ICD-10-CM | POA: Diagnosis not present

## 2017-05-07 DIAGNOSIS — E119 Type 2 diabetes mellitus without complications: Secondary | ICD-10-CM | POA: Diagnosis not present

## 2017-05-07 DIAGNOSIS — E785 Hyperlipidemia, unspecified: Secondary | ICD-10-CM | POA: Diagnosis not present

## 2017-05-07 DIAGNOSIS — M1000 Idiopathic gout, unspecified site: Secondary | ICD-10-CM

## 2017-05-07 DIAGNOSIS — I10 Essential (primary) hypertension: Secondary | ICD-10-CM | POA: Diagnosis not present

## 2017-05-07 LAB — COMPREHENSIVE METABOLIC PANEL
ALBUMIN: 4.3 g/dL (ref 3.5–5.2)
ALT: 66 U/L — AB (ref 0–53)
AST: 43 U/L — AB (ref 0–37)
Alkaline Phosphatase: 86 U/L (ref 39–117)
BILIRUBIN TOTAL: 1.4 mg/dL — AB (ref 0.2–1.2)
BUN: 27 mg/dL — ABNORMAL HIGH (ref 6–23)
CO2: 27 meq/L (ref 19–32)
CREATININE: 1.57 mg/dL — AB (ref 0.40–1.50)
Calcium: 10.2 mg/dL (ref 8.4–10.5)
Chloride: 96 mEq/L (ref 96–112)
GFR: 49.54 mL/min — ABNORMAL LOW (ref >60.00)
Glucose, Bld: 131 mg/dL — ABNORMAL HIGH (ref 70–99)
Potassium: 3.9 mEq/L (ref 3.5–5.1)
Sodium: 130 mEq/L — ABNORMAL LOW (ref 135–145)
TOTAL PROTEIN: 7 g/dL (ref 6.0–8.3)

## 2017-05-07 LAB — CBC
HCT: 40.4 % (ref 39.0–52.0)
Hemoglobin: 13.8 g/dL (ref 13.0–17.0)
MCHC: 34.1 g/dL (ref 30.0–36.0)
MCV: 87.6 fl (ref 78.0–100.0)
Platelets: 155 10*3/uL (ref 150.0–400.0)
RBC: 4.61 Mil/uL (ref 4.22–5.81)
RDW: 13.1 % (ref 11.5–15.5)
WBC: 9 10*3/uL (ref 4.0–10.5)

## 2017-05-07 LAB — LIPID PANEL
CHOLESTEROL: 95 mg/dL (ref 0–200)
HDL: 29.2 mg/dL — ABNORMAL LOW (ref >39.00)
NonHDL: 65.35
Total CHOL/HDL Ratio: 3
Triglycerides: 209 mg/dL — ABNORMAL HIGH (ref 0.0–149.0)
VLDL: 41.8 mg/dL — ABNORMAL HIGH (ref 0.0–40.0)

## 2017-05-07 LAB — LDL CHOLESTEROL, DIRECT: LDL DIRECT: 47 mg/dL

## 2017-05-07 LAB — VITAMIN D 25 HYDROXY (VIT D DEFICIENCY, FRACTURES): VITD: 41.75 ng/mL (ref 30.00–100.00)

## 2017-05-07 LAB — URIC ACID: Uric Acid, Serum: 7 mg/dL (ref 4.0–7.8)

## 2017-05-07 LAB — TSH: TSH: 2.93 u[IU]/mL (ref 0.35–4.50)

## 2017-05-07 LAB — HEMOGLOBIN A1C: HEMOGLOBIN A1C: 7.3 % — AB (ref 4.6–6.5)

## 2017-05-08 ENCOUNTER — Telehealth: Payer: Self-pay | Admitting: Family Medicine

## 2017-05-08 ENCOUNTER — Other Ambulatory Visit: Payer: Self-pay | Admitting: Family Medicine

## 2017-05-08 DIAGNOSIS — R945 Abnormal results of liver function studies: Secondary | ICD-10-CM

## 2017-05-08 MED ORDER — GLIMEPIRIDE 2 MG PO TABS: 2.0000 mg | ORAL_TABLET | Freq: Every day | ORAL | 3 refills | Status: DC

## 2017-05-08 NOTE — Telephone Encounter (Signed)
Let's do next Thursday morning at 9:15

## 2017-05-08 NOTE — Telephone Encounter (Signed)
Called pt back, lvm advising him of the below and to rtn call to schedule lab and fu visit w/pcp

## 2017-05-08 NOTE — Telephone Encounter (Signed)
Pt was advised to come in to follow up with PCP, pt would like to come in sooner than later.  Please advise for scheduling.     I will call pt back to schedule visit with PCP as well as lab apt. Orders have already been placed by CMA.

## 2017-05-09 NOTE — Telephone Encounter (Signed)
Pt sent My Chart message to confirm time and date provided will work for him.   Pt has been scheduled.

## 2017-05-13 ENCOUNTER — Other Ambulatory Visit (INDEPENDENT_AMBULATORY_CARE_PROVIDER_SITE_OTHER): Payer: BLUE CROSS/BLUE SHIELD

## 2017-05-13 DIAGNOSIS — K7689 Other specified diseases of liver: Secondary | ICD-10-CM | POA: Diagnosis not present

## 2017-05-13 DIAGNOSIS — R945 Abnormal results of liver function studies: Secondary | ICD-10-CM

## 2017-05-14 LAB — COMPREHENSIVE METABOLIC PANEL
ALT: 49 U/L (ref 0–53)
AST: 37 U/L (ref 0–37)
Albumin: 4.3 g/dL (ref 3.5–5.2)
Alkaline Phosphatase: 82 U/L (ref 39–117)
BUN: 20 mg/dL (ref 6–23)
CHLORIDE: 93 meq/L — AB (ref 96–112)
CO2: 29 mEq/L (ref 19–32)
Calcium: 10.4 mg/dL (ref 8.4–10.5)
Creatinine, Ser: 1.23 mg/dL (ref 0.40–1.50)
GFR: 65.65 mL/min (ref >60.00)
GLUCOSE: 114 mg/dL — AB (ref 70–99)
POTASSIUM: 3.7 meq/L (ref 3.5–5.1)
SODIUM: 128 meq/L — AB (ref 135–145)
Total Bilirubin: 1.2 mg/dL (ref 0.2–1.2)
Total Protein: 6.3 g/dL (ref 6.0–8.3)

## 2017-05-15 ENCOUNTER — Ambulatory Visit (INDEPENDENT_AMBULATORY_CARE_PROVIDER_SITE_OTHER): Payer: BLUE CROSS/BLUE SHIELD | Admitting: Family Medicine

## 2017-05-15 ENCOUNTER — Encounter: Payer: Self-pay | Admitting: Family Medicine

## 2017-05-15 VITALS — BP 122/58 | HR 62 | Temp 97.8°F | Resp 18 | Wt 229.2 lb

## 2017-05-15 DIAGNOSIS — M8580 Other specified disorders of bone density and structure, unspecified site: Secondary | ICD-10-CM | POA: Diagnosis not present

## 2017-05-15 DIAGNOSIS — I10 Essential (primary) hypertension: Secondary | ICD-10-CM

## 2017-05-15 DIAGNOSIS — E871 Hypo-osmolality and hyponatremia: Secondary | ICD-10-CM | POA: Diagnosis not present

## 2017-05-15 DIAGNOSIS — E785 Hyperlipidemia, unspecified: Secondary | ICD-10-CM | POA: Diagnosis not present

## 2017-05-15 DIAGNOSIS — R0600 Dyspnea, unspecified: Secondary | ICD-10-CM

## 2017-05-15 DIAGNOSIS — F17200 Nicotine dependence, unspecified, uncomplicated: Secondary | ICD-10-CM | POA: Diagnosis not present

## 2017-05-15 DIAGNOSIS — E119 Type 2 diabetes mellitus without complications: Secondary | ICD-10-CM | POA: Diagnosis not present

## 2017-05-15 DIAGNOSIS — E559 Vitamin D deficiency, unspecified: Secondary | ICD-10-CM

## 2017-05-15 LAB — HEPATITIS PANEL, ACUTE
HCV AB: NONREACTIVE
HEP A IGM: NONREACTIVE
HEP B S AG: NONREACTIVE
Hep B C IgM: NONREACTIVE

## 2017-05-15 LAB — COMPREHENSIVE METABOLIC PANEL
ALT: 55 U/L — ABNORMAL HIGH (ref 0–53)
AST: 40 U/L — AB (ref 0–37)
Albumin: 4.2 g/dL (ref 3.5–5.2)
Alkaline Phosphatase: 74 U/L (ref 39–117)
BUN: 19 mg/dL (ref 6–23)
CHLORIDE: 92 meq/L — AB (ref 96–112)
CO2: 30 meq/L (ref 19–32)
CREATININE: 1.18 mg/dL (ref 0.40–1.50)
Calcium: 10.7 mg/dL — ABNORMAL HIGH (ref 8.4–10.5)
GFR: 68.87 mL/min (ref >60.00)
Glucose, Bld: 199 mg/dL — ABNORMAL HIGH (ref 70–99)
POTASSIUM: 3.9 meq/L (ref 3.5–5.1)
SODIUM: 127 meq/L — AB (ref 135–145)
Total Bilirubin: 1.3 mg/dL — ABNORMAL HIGH (ref 0.2–1.2)
Total Protein: 6.7 g/dL (ref 6.0–8.3)

## 2017-05-15 MED ORDER — ATORVASTATIN CALCIUM 10 MG PO TABS: 10.0000 mg | ORAL_TABLET | Freq: Every day | ORAL | 1 refills | Status: DC

## 2017-05-15 MED ORDER — VITAMIN D (ERGOCALCIFEROL) 1.25 MG (50000 UNIT) PO CAPS: 50000.0000 [IU] | ORAL_CAPSULE | ORAL | 3 refills | Status: DC

## 2017-05-15 NOTE — Assessment & Plan Note (Signed)
Worsening recently repeat Echo

## 2017-05-15 NOTE — Assessment & Plan Note (Signed)
Well controlled, no changes to meds. Encouraged heart healthy diet such as the DASH diet and exercise as tolerated. Tolerating the CCB.

## 2017-05-15 NOTE — Assessment & Plan Note (Signed)
Improved numbers with decreased carbohydrate intake and is tolerating Glimeperide.

## 2017-05-15 NOTE — Progress Notes (Signed)
Subjective:  I acted as a Education administrator for Dr. Charlett Blake. [place], [place]  Patient ID: Steve Beasley, male    DOB: May 30, 1966, 51 y.o.   MRN: [id number]  No chief complaint on file.   HPI  Patient is in today for a follow up  He is feeling well today. He has been eating better and exercising again since he got his lab results back. No polyuria but he has been drinkingexcessive amounts of water, over 100 ounces a day as he has tried to get healthier. No recent febrile illness or hospitalizations. Denies CP/palp/SOB/HA/congestion/fevers/GI or GU c/o. Taking meds as prescribed.   Patient Care Team: Mosie Lukes, MD as PCP - General (Family Medicine)   Past Medical History:  Diagnosis Date  . Allergy-induced asthma   . Anxiety state, unspecified 09/19/2013  . Atypical chest pain 11/13/2014  . Basal cell carcinoma of scalp 09/09/2016  . Diabetes mellitus without complication ([place])   . Fatty liver disease, nonalcoholic 87/68/1157  . Gout 09/18/2014  . Hypertension   . Nicotine use disorder 06/06/2013   smokeless  . Obesity, unspecified 06/06/2013  . Osteopenia   . Other and unspecified hyperlipidemia 01/19/2014  . Preventative health care 09/19/2013  . Right heart enlargement 09/18/2014  . Testicular cancer ([place])   . Tinnitus 03/24/2017  . Valvular heart disease 09/18/2014    Past Surgical History:  Procedure Laterality Date  . BASAL CELL CARCINOMA EXCISION     Within 6 months  . SKIN CANCER EXCISION     Within 6 months  . testicular cancer surgery      Family History  Problem Relation Age of Onset  . Diabetes Mother   . Breast cancer Mother   . Bone cancer Mother   . Cancer Mother        breast with bone mets  . Depression Mother   . Hypertension Father   . Prostate cancer Father   . Hyperlipidemia Father   . Cancer Father        prostate  . Heart attack Neg Hx   . Sudden death Neg Hx   . Stroke Paternal Grandfather   . Prostate cancer Maternal Grandfather   .  Hypertension Brother   . Gallstones Brother   . Anxiety disorder Daughter   . ADD / ADHD Daughter   . Bulemia Daughter   . Depression Son   . Anxiety disorder Maternal Aunt        social anxiety, anger  . Stroke Maternal Uncle 56    Social History   Social History  . Marital status: Married    Spouse name: N/A  . Number of children: 2  . Years of education: N/A   Occupational History  . finance    Social History Main Topics  . Smoking status: Former Smoker    Types: Cigarettes  . Smokeless tobacco: Former Systems developer    Quit date: 02/28/2014     Comment: 30 years ago socially  . Alcohol use 0.0 oz/week  . Drug use: No  . Sexual activity: Yes     Comment: lives with wife, travels with work, no dietary restrictions , exercise regular   Other Topics Concern  . Not on file   Social History Narrative  . No narrative on file    Outpatient Medications Prior to Visit  Medication Sig Dispense Refill  . albuterol (PROVENTIL HFA;VENTOLIN HFA) 108 (90 Base) MCG/ACT inhaler Inhale 2 puffs into the lungs every 6 (six) hours as needed  for wheezing or shortness of breath. 1 Inhaler 2  . allopurinol (ZYLOPRIM) 100 MG tablet TAKE 1 TABLET(100 MG) BY MOUTH TWICE DAILY 180 tablet 0  . amLODipine (NORVASC) 2.5 MG tablet Take 1 tablet (2.5 mg total) by mouth daily. 90 tablet 0  . Cholecalciferol (VITAMIN D3) 5000 UNITS CAPS Take 1 capsule by mouth daily.    Marland Kitchen glimepiride (AMARYL) 2 MG tablet Take 1 tablet (2 mg total) by mouth daily with breakfast. 30 tablet 3  . glucose blood test strip CHECK BLOOD SUGAR DAILY AND PRN. 100 each 4  . losartan (COZAAR) 100 MG tablet Take 1 tablet (100 mg total) by mouth daily. 90 tablet 2  . metFORMIN (GLUCOPHAGE) 1000 MG tablet Take 1 tablet (1,000 mg total) by mouth 2 (two) times daily with a meal. 180 tablet 1  . metFORMIN (GLUCOPHAGE) 1000 MG tablet TAKE 1 TABLET BY MOUTH TWICE DAILY WITH MEALS (Patient taking differently: TAKE 1 TABLET BY MOUTH ONCE DAILY WITH  MEALS) 180 tablet 0  . Misc Natural Products (TART CHERRY ADVANCED) CAPS 2 (two) times daily. 250 mg    . NON FORMULARY curamin Takes 3 per day    . triamterene-hydrochlorothiazide (MAXZIDE-25) 37.5-25 MG tablet Take 1 tablet by mouth daily. 90 tablet 2  . atorvastatin (LIPITOR) 10 MG tablet TAKE 1 TABLET BY MOUTH EVERY NIGHT AT BEDTIME 90 tablet 0   No facility-administered medications prior to visit.     No Known Allergies  Review of Systems  Constitutional: Negative for fever and malaise/fatigue.  HENT: Negative for congestion.   Eyes: Negative for blurred vision.  Respiratory: Negative for cough and shortness of breath.   Cardiovascular: Negative for chest pain, palpitations and leg swelling.  Gastrointestinal: Negative for vomiting.  Musculoskeletal: Negative for back pain.  Skin: Negative for rash.  Neurological: Negative for loss of consciousness and headaches.  Psychiatric/Behavioral: The patient is nervous/anxious.        Objective:    Physical Exam  Constitutional: He is oriented to person, place, and time. He appears well-developed and well-nourished. No distress.  HENT:  Head: Normocephalic and atraumatic.  Eyes: Conjunctivae are normal.  Neck: Normal range of motion. No thyromegaly present.  Cardiovascular: Normal rate and regular rhythm.   Pulmonary/Chest: Effort normal and breath sounds normal. He has no wheezes.  Abdominal: Soft. Bowel sounds are normal. There is no tenderness.  Musculoskeletal: Normal range of motion. He exhibits no edema or deformity.  Neurological: He is alert and oriented to person, place, and time.  Skin: Skin is warm and dry. He is not diaphoretic.  Psychiatric: He has a normal mood and affect.    BP (!) 122/58 (BP Location: Left Arm, Patient Position: Sitting, Cuff Size: Normal)   Pulse 62   Temp 97.8 F (36.6 C) (Oral)   Resp 18   Wt 229 lb 3.2 oz (104 kg)   SpO2 98%   BMI 31.97 kg/m  Wt Readings from Last 3 Encounters:    05/15/17 229 lb 3.2 oz (104 kg)  03/24/17 230 lb 12.8 oz (104.7 kg)  09/09/16 220 lb 6 oz (100 kg)   BP Readings from Last 3 Encounters:  05/15/17 (!) 122/58  03/24/17 (!) 152/80  09/09/16 122/76     Immunization History  Administered Date(s) Administered  . Tdap 01/17/2014    Health Maintenance  Topic Date Due  . PNEUMOCOCCAL POLYSACCHARIDE VACCINE (1) 04/07/1967  . FOOT EXAM  04/07/1975  . HIV Screening  04/06/1980  . COLONOSCOPY  04/07/2015  . OPHTHALMOLOGY EXAM  10/04/2016  . INFLUENZA VACCINE  04/30/2017  . HEMOGLOBIN A1C  11/07/2017  . TETANUS/TDAP  01/18/2024    Lab Results  Component Value Date   WBC 9.0 05/07/2017   HGB 13.8 05/07/2017   HCT 40.4 05/07/2017   PLT 155.0 05/07/2017   GLUCOSE 199 (H) 05/15/2017   CHOL 95 05/07/2017   TRIG 209.0 (H) 05/07/2017   HDL 29.20 (L) 05/07/2017   LDLDIRECT 47.0 05/07/2017   LDLCALC 52 03/27/2016   ALT 55 (H) 05/15/2017   AST 40 (H) 05/15/2017   NA 127 (L) 05/15/2017   K 3.9 05/15/2017   CL 92 (L) 05/15/2017   CREATININE 1.18 05/15/2017   BUN 19 05/15/2017   CO2 30 05/15/2017   TSH 2.93 05/07/2017   PSA 2.90 03/27/2016   HGBA1C 7.3 (H) 05/07/2017   MICROALBUR 1.3 08/21/2015    Lab Results  Component Value Date   TSH 2.93 05/07/2017   Lab Results  Component Value Date   WBC 9.0 05/07/2017   HGB 13.8 05/07/2017   HCT 40.4 05/07/2017   MCV 87.6 05/07/2017   PLT 155.0 05/07/2017   Lab Results  Component Value Date   NA 127 (L) 05/15/2017   K 3.9 05/15/2017   CO2 30 05/15/2017   GLUCOSE 199 (H) 05/15/2017   BUN 19 05/15/2017   CREATININE 1.18 05/15/2017   BILITOT 1.3 (H) 05/15/2017   ALKPHOS 74 05/15/2017   AST 40 (H) 05/15/2017   ALT 55 (H) 05/15/2017   PROT 6.7 05/15/2017   ALBUMIN 4.2 05/15/2017   CALCIUM 10.7 (H) 05/15/2017   GFR 68.87 05/15/2017   Lab Results  Component Value Date   CHOL 95 05/07/2017   Lab Results  Component Value Date   HDL 29.20 (L) 05/07/2017   Lab Results   Component Value Date   LDLCALC 52 03/27/2016   Lab Results  Component Value Date   TRIG 209.0 (H) 05/07/2017   Lab Results  Component Value Date   CHOLHDL 3 05/07/2017   Lab Results  Component Value Date   HGBA1C 7.3 (H) 05/07/2017         Assessment & Plan:   Problem List Items Addressed This Visit    DM (diabetes mellitus), type 2 ([place])    Improved numbers with decreased carbohydrate intake and is tolerating Glimeperide.       Relevant Medications   atorvastatin (LIPITOR) 10 MG tablet   Other Relevant Orders   Hemoglobin A1c   HTN (hypertension)    Well controlled, no changes to meds. Encouraged heart healthy diet such as the DASH diet and exercise as tolerated. Tolerating the CCB.      Relevant Medications   atorvastatin (LIPITOR) 10 MG tablet   Other Relevant Orders   CBC   Comprehensive metabolic panel   TSH   Osteopenia    Encouraged to get adequate exercise, calcium and vitamin d intake      Vitamin D deficiency   Relevant Orders   VITAMIN D 25 Hydroxy (Vit-D Deficiency, Fractures)   Nicotine use disorder    Was chewing a can a day, no intake for 7 days now encouraged to continue      Hyperlipidemia    After discussing diet and exercise changes with patient we have decided not to increase the Atorvastatin to 20 mg po but to stay at 10 while he continues to make changes. Recheck in 3 months      Relevant Medications  atorvastatin (LIPITOR) 10 MG tablet   Other Relevant Orders   Lipid panel   Dyspnea    Worsening recently repeat Echo      Relevant Orders   ECHOCARDIOGRAM COMPLETE    Other Visit Diagnoses    Hyponatremia    -  Primary   Relevant Orders   Comprehensive metabolic panel (Completed)   Low sodium levels       Relevant Orders   Comprehensive metabolic panel      I have changed Mr. Beasley's atorvastatin. I am also having him start on Vitamin D (Ergocalciferol). Additionally, I am having him maintain his Vitamin D3, TART  CHERRY ADVANCED, NON FORMULARY, losartan, triamterene-hydrochlorothiazide, glucose blood, albuterol, amLODipine, allopurinol, metFORMIN, metFORMIN, and glimepiride.  Meds ordered this encounter  Medications  . Vitamin D, Ergocalciferol, (DRISDOL) 50000 units CAPS capsule    Sig: Take 1 capsule (50,000 Units total) by mouth every 7 (seven) days.    Dispense:  4 capsule    Refill:  3  . atorvastatin (LIPITOR) 10 MG tablet    Sig: Take 1 tablet (10 mg total) by mouth at bedtime.    Dispense:  90 tablet    Refill:  1    CMA served as Education administrator during this visit. History, Physical and Plan performed by medical provider. Documentation and orders reviewed and attested to.  Penni Homans, MD

## 2017-05-15 NOTE — Assessment & Plan Note (Signed)
After discussing diet and exercise changes with patient we have decided not to increase the Atorvastatin to 20 mg po but to stay at 10 while he continues to make changes. Recheck in 3 months

## 2017-05-15 NOTE — Patient Instructions (Addendum)
64 oz of fluids is a good baseline  benefiber twice a day Hyponatremia Hyponatremia is when the amount of salt (sodium) in your blood is too low. When salt levels are low, your cells absorb extra water and they swell. The swelling happens throughout the body, but it mostly affects the brain. Follow these instructions at home:  Take medicines only as told by your doctor. Many medicines can make this condition worse. Talk with your doctor about any medicines that you are currently taking.  Carefully follow a recommended diet as told by your doctor.  Carefully follow instructions from your doctor about fluid restrictions.  Keep all follow-up visits as told by your doctor. This is important.  Do not drink alcohol. Contact a doctor if:  You feel sicker to your stomach (nauseous).  You feel more confused.  You feel more tired (fatigued).  Your headache gets worse.  You feel weaker.  Your symptoms go away and then they come back.  You have trouble following the diet instructions. Get help right away if:  You start to twitch and shake (have a seizure).  You pass out (faint).  You keep having watery poop (diarrhea).  You keep throwing up (vomiting). This information is not intended to replace advice given to you by your health care provider. Make sure you discuss any questions you have with your health care provider. Document Released: 05/29/2011 Document Revised: 02/22/2016 Document Reviewed: 09/12/2014 Elsevier Interactive Patient Education  [employer].

## 2017-05-15 NOTE — Progress Notes (Signed)
Patient notified in ofc visit   PC

## 2017-05-15 NOTE — Assessment & Plan Note (Signed)
Was chewing a can a day, no intake for 7 days now encouraged to continue

## 2017-05-15 NOTE — Assessment & Plan Note (Signed)
>>  ASSESSMENT AND PLAN FOR HTN (HYPERTENSION) WRITTEN ON 05/15/2017  9:46 AM BY BLYTH, STACEY A, MD  Well controlled, no changes to meds. Encouraged heart healthy diet such as the DASH diet and exercise as tolerated. Tolerating the CCB.

## 2017-05-15 NOTE — Assessment & Plan Note (Signed)
Encouraged to get adequate exercise, calcium and vitamin d intake 

## 2017-05-23 ENCOUNTER — Other Ambulatory Visit: Payer: BLUE CROSS/BLUE SHIELD

## 2017-05-28 DIAGNOSIS — H903 Sensorineural hearing loss, bilateral: Secondary | ICD-10-CM | POA: Diagnosis not present

## 2017-05-28 DIAGNOSIS — E119 Type 2 diabetes mellitus without complications: Secondary | ICD-10-CM | POA: Diagnosis not present

## 2017-05-28 DIAGNOSIS — H9313 Tinnitus, bilateral: Secondary | ICD-10-CM | POA: Diagnosis not present

## 2017-05-29 ENCOUNTER — Other Ambulatory Visit (INDEPENDENT_AMBULATORY_CARE_PROVIDER_SITE_OTHER): Payer: BLUE CROSS/BLUE SHIELD

## 2017-05-29 DIAGNOSIS — E871 Hypo-osmolality and hyponatremia: Secondary | ICD-10-CM | POA: Diagnosis not present

## 2017-05-29 LAB — COMPREHENSIVE METABOLIC PANEL
ALBUMIN: 4.2 g/dL (ref 3.5–5.2)
ALT: 54 U/L — ABNORMAL HIGH (ref 0–53)
AST: 33 U/L (ref 0–37)
Alkaline Phosphatase: 84 U/L (ref 39–117)
BUN: 22 mg/dL (ref 6–23)
CALCIUM: 10.7 mg/dL — AB (ref 8.4–10.5)
CHLORIDE: 98 meq/L (ref 96–112)
CO2: 30 meq/L (ref 19–32)
Creatinine, Ser: 1.31 mg/dL (ref 0.40–1.50)
GFR: 61.04 mL/min (ref >60.00)
Glucose, Bld: 162 mg/dL — ABNORMAL HIGH (ref 70–99)
Potassium: 4.1 mEq/L (ref 3.5–5.1)
Sodium: 134 mEq/L — ABNORMAL LOW (ref 135–145)
Total Bilirubin: 1.3 mg/dL — ABNORMAL HIGH (ref 0.2–1.2)
Total Protein: 6.8 g/dL (ref 6.0–8.3)

## 2017-06-06 NOTE — Progress Notes (Signed)
Mailed letter to patient to please call and schedule for repeat echo as they have been unable to reach him/thx dmf

## 2017-06-10 DIAGNOSIS — E119 Type 2 diabetes mellitus without complications: Secondary | ICD-10-CM | POA: Diagnosis not present

## 2017-06-10 DIAGNOSIS — H903 Sensorineural hearing loss, bilateral: Secondary | ICD-10-CM | POA: Diagnosis not present

## 2017-06-10 DIAGNOSIS — H9313 Tinnitus, bilateral: Secondary | ICD-10-CM | POA: Diagnosis not present

## 2017-06-23 ENCOUNTER — Telehealth: Payer: Self-pay | Admitting: Family Medicine

## 2017-06-25 ENCOUNTER — Other Ambulatory Visit: Payer: Self-pay | Admitting: Family Medicine

## 2017-06-25 NOTE — Telephone Encounter (Signed)
Pt called in because he said that he will be going overseas and need a 6 month supply of his medication if possible. Pt says that he need a refill on Glimepiride, Amlodipine, Allopurinol, Losartan and Triamterene      Pharmacy: [facility] [place], [place]

## 2017-06-26 NOTE — Telephone Encounter (Signed)
Medications sent to pharmacy.  PC 

## 2017-07-26 ENCOUNTER — Other Ambulatory Visit: Payer: Self-pay | Admitting: Family Medicine

## 2017-08-26 ENCOUNTER — Other Ambulatory Visit: Payer: Self-pay | Admitting: Family Medicine

## 2017-08-28 ENCOUNTER — Other Ambulatory Visit: Payer: Self-pay | Admitting: Family Medicine

## 2017-09-20 ENCOUNTER — Other Ambulatory Visit: Payer: Self-pay | Admitting: Family Medicine

## 2017-09-22 ENCOUNTER — Other Ambulatory Visit: Payer: Self-pay | Admitting: Family Medicine

## 2017-09-25 ENCOUNTER — Encounter: Payer: BLUE CROSS/BLUE SHIELD | Admitting: Family Medicine

## 2017-09-25 ENCOUNTER — Other Ambulatory Visit: Payer: Self-pay | Admitting: Family Medicine

## 2017-09-27 ENCOUNTER — Other Ambulatory Visit: Payer: Self-pay | Admitting: Family Medicine

## 2017-10-06 ENCOUNTER — Telehealth: Payer: Self-pay | Admitting: Family Medicine

## 2017-10-06 MED ORDER — ALLOPURINOL 100 MG PO TABS: ORAL_TABLET | ORAL | 1 refills | Status: DC

## 2017-10-06 MED ORDER — AMLODIPINE BESYLATE 2.5 MG PO TABS: 2.5000 mg | ORAL_TABLET | Freq: Every day | ORAL | 1 refills | Status: DC

## 2017-10-06 MED ORDER — TRIAMTERENE-HCTZ 37.5-25 MG PO TABS: 1.0000 | ORAL_TABLET | Freq: Every day | ORAL | 1 refills | Status: DC

## 2017-10-06 MED ORDER — GLIMEPIRIDE 2 MG PO TABS: ORAL_TABLET | ORAL | 1 refills | Status: DC

## 2017-10-06 MED ORDER — LOSARTAN POTASSIUM 100 MG PO TABS: ORAL_TABLET | ORAL | 1 refills | Status: DC

## 2017-10-06 NOTE — Telephone Encounter (Signed)
rx sent to pharmacy listed. 

## 2017-10-06 NOTE — Telephone Encounter (Signed)
Copied from [place]. Topic: Quick Communication - See Telephone Encounter >> Oct 06, 2017  9:19 AM Conception Chancy, [place] wrote: CRM for notification. See Telephone encounter for:  10/06/17.  Patient states he is traveling more than a month at a time to [place] for work related and states he is needing to get his medications refilled because he will run out before he returns home. Pharmacy is [facility] on file.   Amlodipine 2.5mg  Allopurinol 100mg  Losartan 100mg  Triamerene-hydrochlorothiazide 37.5-25mg  Glimepiride 2mg 

## 2017-10-27 ENCOUNTER — Other Ambulatory Visit: Payer: Self-pay | Admitting: Family Medicine

## 2018-01-04 ENCOUNTER — Other Ambulatory Visit: Payer: Self-pay | Admitting: Family Medicine

## 2018-01-08 ENCOUNTER — Encounter: Payer: Self-pay | Admitting: Family Medicine

## 2018-01-08 MED ORDER — METFORMIN HCL 1000 MG PO TABS: 1000.0000 mg | ORAL_TABLET | Freq: Two times a day (BID) | ORAL | 5 refills | Status: DC

## 2018-01-08 MED ORDER — GLIMEPIRIDE 2 MG PO TABS: 2.0000 mg | ORAL_TABLET | Freq: Two times a day (BID) | ORAL | 5 refills | Status: DC

## 2018-01-09 ENCOUNTER — Telehealth: Payer: Self-pay

## 2018-01-09 NOTE — Telephone Encounter (Signed)
Copied from [place] [id number]. Topic: Quick Communication - See Telephone Encounter >> Jan 08, 2018  7:37 AM Corie Chiquito, [place] wrote: CRM for notification. See Telephone encounter for: 01/08/18.Patient calling because he would like to have his A1C checked. If someone could give him a call back at [phone]     Orders placed for future orders   Patient notified

## 2018-01-20 ENCOUNTER — Other Ambulatory Visit (INDEPENDENT_AMBULATORY_CARE_PROVIDER_SITE_OTHER): Payer: BLUE CROSS/BLUE SHIELD

## 2018-01-20 DIAGNOSIS — E785 Hyperlipidemia, unspecified: Secondary | ICD-10-CM

## 2018-01-20 DIAGNOSIS — E559 Vitamin D deficiency, unspecified: Secondary | ICD-10-CM

## 2018-01-20 DIAGNOSIS — M1000 Idiopathic gout, unspecified site: Secondary | ICD-10-CM | POA: Diagnosis not present

## 2018-01-20 DIAGNOSIS — E119 Type 2 diabetes mellitus without complications: Secondary | ICD-10-CM | POA: Diagnosis not present

## 2018-01-20 DIAGNOSIS — I10 Essential (primary) hypertension: Secondary | ICD-10-CM | POA: Diagnosis not present

## 2018-01-20 LAB — COMPREHENSIVE METABOLIC PANEL
ALBUMIN: 4.1 g/dL (ref 3.5–5.2)
ALK PHOS: 95 U/L (ref 39–117)
ALT: 57 U/L — ABNORMAL HIGH (ref 0–53)
AST: 34 U/L (ref 0–37)
BUN: 24 mg/dL — AB (ref 6–23)
CO2: 29 mEq/L (ref 19–32)
CREATININE: 1.37 mg/dL (ref 0.40–1.50)
Calcium: 10.9 mg/dL — ABNORMAL HIGH (ref 8.4–10.5)
Chloride: 99 mEq/L (ref 96–112)
GFR: 57.82 mL/min — ABNORMAL LOW (ref >60.00)
Glucose, Bld: 190 mg/dL — ABNORMAL HIGH (ref 70–99)
POTASSIUM: 4.2 meq/L (ref 3.5–5.1)
SODIUM: 136 meq/L (ref 135–145)
TOTAL PROTEIN: 6.8 g/dL (ref 6.0–8.3)
Total Bilirubin: 1 mg/dL (ref 0.2–1.2)

## 2018-01-20 LAB — HEMOGLOBIN A1C: HEMOGLOBIN A1C: 8.3 % — AB (ref 4.6–6.5)

## 2018-01-20 LAB — CBC
HCT: 42.9 % (ref 39.0–52.0)
Hemoglobin: 14.6 g/dL (ref 13.0–17.0)
MCHC: 34 g/dL (ref 30.0–36.0)
MCV: 87.6 fl (ref 78.0–100.0)
Platelets: 139 10*3/uL — ABNORMAL LOW (ref 150.0–400.0)
RBC: 4.9 Mil/uL (ref 4.22–5.81)
RDW: 12.8 % (ref 11.5–15.5)
WBC: 9 10*3/uL (ref 4.0–10.5)

## 2018-01-20 LAB — LIPID PANEL
CHOLESTEROL: 126 mg/dL (ref 0–200)
HDL: 33.1 mg/dL — AB (ref >39.00)
NonHDL: 93.16
Total CHOL/HDL Ratio: 4
Triglycerides: 299 mg/dL — ABNORMAL HIGH (ref 0.0–149.0)
VLDL: 59.8 mg/dL — ABNORMAL HIGH (ref 0.0–40.0)

## 2018-01-20 LAB — TSH: TSH: 2.41 u[IU]/mL (ref 0.35–4.50)

## 2018-01-20 LAB — LDL CHOLESTEROL, DIRECT: LDL DIRECT: 71 mg/dL

## 2018-01-20 LAB — URIC ACID: Uric Acid, Serum: 5.5 mg/dL (ref 4.0–7.8)

## 2018-01-20 LAB — VITAMIN D 25 HYDROXY (VIT D DEFICIENCY, FRACTURES): VITD: 20.97 ng/mL — AB (ref 30.00–100.00)

## 2018-01-29 ENCOUNTER — Other Ambulatory Visit: Payer: Self-pay | Admitting: Family Medicine

## 2018-02-26 ENCOUNTER — Ambulatory Visit: Payer: BLUE CROSS/BLUE SHIELD | Admitting: Family Medicine

## 2018-03-23 ENCOUNTER — Encounter: Payer: Self-pay | Admitting: Family Medicine

## 2018-03-23 ENCOUNTER — Ambulatory Visit: Payer: BLUE CROSS/BLUE SHIELD | Admitting: Family Medicine

## 2018-03-23 DIAGNOSIS — I10 Essential (primary) hypertension: Secondary | ICD-10-CM

## 2018-03-23 DIAGNOSIS — E785 Hyperlipidemia, unspecified: Secondary | ICD-10-CM | POA: Diagnosis not present

## 2018-03-23 DIAGNOSIS — K635 Polyp of colon: Secondary | ICD-10-CM | POA: Diagnosis not present

## 2018-03-23 DIAGNOSIS — E119 Type 2 diabetes mellitus without complications: Secondary | ICD-10-CM | POA: Diagnosis not present

## 2018-03-23 DIAGNOSIS — M109 Gout, unspecified: Secondary | ICD-10-CM

## 2018-03-23 MED ORDER — AMLODIPINE BESYLATE 2.5 MG PO TABS: 2.5000 mg | ORAL_TABLET | Freq: Every day | ORAL | 1 refills | Status: DC

## 2018-03-23 MED ORDER — GLUCOSE BLOOD VI STRP: ORAL_STRIP | 4 refills | Status: DC

## 2018-03-23 MED ORDER — GLIMEPIRIDE 2 MG PO TABS
2.0000 mg | ORAL_TABLET | Freq: Every day | ORAL | 5 refills | Status: DC
Start: 2018-03-23 — End: 2019-05-09

## 2018-03-23 MED ORDER — ATORVASTATIN CALCIUM 10 MG PO TABS: ORAL_TABLET | ORAL | 0 refills | Status: DC

## 2018-03-23 NOTE — Assessment & Plan Note (Signed)
Well controlled, no changes to meds. Encouraged heart healthy diet such as the DASH diet and exercise as tolerated.  °

## 2018-03-23 NOTE — Assessment & Plan Note (Signed)
Check uric acid. 

## 2018-03-23 NOTE — Progress Notes (Signed)
Subjective:  I acted as a Education administrator for Dr. Charlett Blake. [place], [place]  Patient ID: Steve Beasley, male    DOB: [birth year], 52 y.o.   MRN: [id number]  No chief complaint on file.   HPI  Patient is in today for a blood sugar follow up. He feels well when he saw his last set of labs he got motivated to clean up his dieta again. Is trying to minimize simple carbs and eat a heart healthy diet. He has begun exercising more again. No polyuria or polydipsia. Sugar log reviewed numbers ranging from 71 to 144 recently. Average just above 100. Denies CP/palp/SOB/HA/congestion/fevers/GI or GU c/o. Taking meds as prescribed  Patient Care Team: Mosie Lukes, MD as PCP - General (Family Medicine)   Past Medical History:  Diagnosis Date  . Allergy-induced asthma   . Anxiety state, unspecified 09/19/2013  . Atypical chest pain 11/13/2014  . Basal cell carcinoma of scalp 09/09/2016  . Diabetes mellitus without complication ([place])   . Fatty liver disease, nonalcoholic 67/89/3810  . Gout 09/18/2014  . Hypertension   . Nicotine use disorder 06/06/2013   smokeless  . Obesity, unspecified 06/06/2013  . Osteopenia   . Other and unspecified hyperlipidemia 01/19/2014  . Preventative health care 09/19/2013  . Right heart enlargement 09/18/2014  . Testicular cancer ([place])   . Tinnitus 03/24/2017  . Valvular heart disease 09/18/2014    Past Surgical History:  Procedure Laterality Date  . BASAL CELL CARCINOMA EXCISION     Within 6 months  . SKIN CANCER EXCISION     Within 6 months  . testicular cancer surgery      Family History  Problem Relation Age of Onset  . Diabetes Mother   . Breast cancer Mother   . Bone cancer Mother   . Cancer Mother        breast with bone mets  . Depression Mother   . Hypertension Father   . Prostate cancer Father   . Hyperlipidemia Father   . Cancer Father        prostate  . Heart attack Neg Hx   . Sudden death Neg Hx   . Stroke Paternal Grandfather   . Prostate  cancer Maternal Grandfather   . Hypertension Brother   . Gallstones Brother   . Anxiety disorder Daughter   . ADD / ADHD Daughter   . Bulemia Daughter   . Depression Son   . Anxiety disorder Maternal Aunt        social anxiety, anger  . Stroke Maternal Uncle 16    Social History   Socioeconomic History  . Marital status: Married    Spouse name: Not on file  . Number of children: 2  . Years of education: Not on file  . Highest education level: Not on file  Occupational History  . Occupation: Special educational needs teacher Needs  . Financial resource strain: Not on file  . Food insecurity:    Worry: Not on file    Inability: Not on file  . Transportation needs:    Medical: Not on file    Non-medical: Not on file  Tobacco Use  . Smoking status: Former Smoker    Types: Cigarettes  . Smokeless tobacco: Former Systems developer    Quit date: 02/28/2014  . Tobacco comment: 30 years ago socially  Substance and Sexual Activity  . Alcohol use: Yes    Alcohol/week: 0.0 oz  . Drug use: No  . Sexual activity: Yes  Comment: lives with wife, travels with work, no dietary restrictions , exercise regular  Lifestyle  . Physical activity:    Days per week: Not on file    Minutes per session: Not on file  . Stress: Not on file  Relationships  . Social connections:    Talks on phone: Not on file    Gets together: Not on file    Attends religious service: Not on file    Active member of club or organization: Not on file    Attends meetings of clubs or organizations: Not on file    Relationship status: Not on file  . Intimate partner violence:    Fear of current or ex partner: Not on file    Emotionally abused: Not on file    Physically abused: Not on file    Forced sexual activity: Not on file  Other Topics Concern  . Not on file  Social History Narrative  . Not on file    Outpatient Medications Prior to Visit  Medication Sig Dispense Refill  . albuterol (PROVENTIL HFA;VENTOLIN HFA) 108 (90 Base)  MCG/ACT inhaler Inhale 2 puffs into the lungs every 6 (six) hours as needed for wheezing or shortness of breath. 1 Inhaler 2  . allopurinol (ZYLOPRIM) 100 MG tablet TAKE 1 TABLET(100 MG) BY MOUTH TWICE DAILY 180 tablet 1  . atorvastatin (LIPITOR) 10 MG tablet TAKE 1 TABLET BY MOUTH EVERY NIGHT AT BEDTIME 90 tablet 0  . Cholecalciferol (VITAMIN D3) 5000 UNITS CAPS Take 1 capsule by mouth daily.    Marland Kitchen glucose blood (ONE TOUCH ULTRA TEST) test strip USE AS DIRECTED TO TEST BLOOD SUGAR EVERY DAY AND AS NEEDED 100 each 0  . losartan (COZAAR) 100 MG tablet TAKE 1 TABLET(100 MG) BY MOUTH DAILY 90 tablet 1  . metFORMIN (GLUCOPHAGE) 1000 MG tablet Take 1 tablet (1,000 mg total) by mouth 2 (two) times daily with a meal. 60 tablet 5  . Misc Natural Products (TART CHERRY ADVANCED) CAPS 2 (two) times daily. 250 mg    . NON FORMULARY curamin Takes 3 per day    . triamterene-hydrochlorothiazide (MAXZIDE-25) 37.5-25 MG tablet Take 1 tablet by mouth daily. 90 tablet 1  . Vitamin D, Ergocalciferol, (DRISDOL) 50000 units CAPS capsule TAKE 1 CAPSULE BY MOUTH EVERY 7 DAYS 4 capsule 0  . amLODipine (NORVASC) 2.5 MG tablet Take 1 tablet (2.5 mg total) by mouth daily. 90 tablet 1  . amLODipine (NORVASC) 2.5 MG tablet TAKE 1 TABLET BY MOUTH EVERY DAY 90 tablet 0  . atorvastatin (LIPITOR) 10 MG tablet TAKE 1 TABLET(10 MG) BY MOUTH AT BEDTIME 90 tablet 0  . glimepiride (AMARYL) 2 MG tablet Take 1 tablet (2 mg total) by mouth 2 (two) times daily with a meal. 60 tablet 5  . glucose blood test strip CHECK BLOOD SUGAR DAILY AND PRN. 100 each 4   No facility-administered medications prior to visit.     No Known Allergies  Review of Systems  Constitutional: Negative for fever and malaise/fatigue.  HENT: Negative for congestion.   Eyes: Negative for blurred vision.  Respiratory: Negative for shortness of breath.   Cardiovascular: Negative for chest pain, palpitations and leg swelling.  Gastrointestinal: Negative for  abdominal pain, blood in stool and nausea.  Genitourinary: Negative for dysuria and frequency.  Musculoskeletal: Negative for falls.  Skin: Negative for rash.  Neurological: Negative for dizziness, loss of consciousness and headaches.  Endo/Heme/Allergies: Negative for environmental allergies.  Psychiatric/Behavioral: Negative for depression. The patient  is not nervous/anxious.        Objective:    Physical Exam  Constitutional: He is oriented to person, place, and time. He appears well-developed and well-nourished. No distress.  HENT:  Head: Normocephalic and atraumatic.  Nose: Nose normal.  Eyes: Right eye exhibits no discharge. Left eye exhibits no discharge.  Neck: Normal range of motion. Neck supple.  Cardiovascular: Normal rate and regular rhythm.  No murmur heard. Pulmonary/Chest: Effort normal and breath sounds normal.  Abdominal: Soft. Bowel sounds are normal. There is no tenderness.  Musculoskeletal: He exhibits no edema.  Neurological: He is alert and oriented to person, place, and time.  Skin: Skin is warm and dry.  Psychiatric: He has a normal mood and affect.  Nursing note and vitals reviewed.   BP 138/68 (BP Location: Left Arm, Patient Position: Sitting, Cuff Size: Normal)   Pulse 62   Temp 98.2 F (36.8 C) (Oral)   Resp 18   Wt 224 lb 3.2 oz (101.7 kg)   SpO2 98%   BMI 31.27 kg/m  Wt Readings from Last 3 Encounters:  03/23/18 224 lb 3.2 oz (101.7 kg)  05/15/17 229 lb 3.2 oz (104 kg)  03/24/17 230 lb 12.8 oz (104.7 kg)   BP Readings from Last 3 Encounters:  03/23/18 138/68  05/15/17 (!) 122/58  03/24/17 (!) 152/80     Immunization History  Administered Date(s) Administered  . Tdap 01/17/2014    Health Maintenance  Topic Date Due  . PNEUMOCOCCAL POLYSACCHARIDE VACCINE (1) 04/07/1967  . FOOT EXAM  04/07/1975  . HIV Screening  04/06/1980  . COLONOSCOPY  04/07/2015  . OPHTHALMOLOGY EXAM  10/04/2016  . INFLUENZA VACCINE  04/30/2018  .  HEMOGLOBIN A1C  07/22/2018  . TETANUS/TDAP  01/18/2024    Lab Results  Component Value Date   WBC 9.0 01/20/2018   HGB 14.6 01/20/2018   HCT 42.9 01/20/2018   PLT 139.0 (L) 01/20/2018   GLUCOSE 190 (H) 01/20/2018   CHOL 126 01/20/2018   TRIG 299.0 (H) 01/20/2018   HDL 33.10 (L) 01/20/2018   LDLDIRECT 71.0 01/20/2018   LDLCALC 52 03/27/2016   ALT 57 (H) 01/20/2018   AST 34 01/20/2018   NA 136 01/20/2018   K 4.2 01/20/2018   CL 99 01/20/2018   CREATININE 1.37 01/20/2018   BUN 24 (H) 01/20/2018   CO2 29 01/20/2018   TSH 2.41 01/20/2018   PSA 2.90 03/27/2016   HGBA1C 8.3 (H) 01/20/2018   MICROALBUR 1.3 08/21/2015    Lab Results  Component Value Date   TSH 2.41 01/20/2018   Lab Results  Component Value Date   WBC 9.0 01/20/2018   HGB 14.6 01/20/2018   HCT 42.9 01/20/2018   MCV 87.6 01/20/2018   PLT 139.0 (L) 01/20/2018   Lab Results  Component Value Date   NA 136 01/20/2018   K 4.2 01/20/2018   CO2 29 01/20/2018   GLUCOSE 190 (H) 01/20/2018   BUN 24 (H) 01/20/2018   CREATININE 1.37 01/20/2018   BILITOT 1.0 01/20/2018   ALKPHOS 95 01/20/2018   AST 34 01/20/2018   ALT 57 (H) 01/20/2018   PROT 6.8 01/20/2018   ALBUMIN 4.1 01/20/2018   CALCIUM 10.9 (H) 01/20/2018   GFR 57.82 (L) 01/20/2018   Lab Results  Component Value Date   CHOL 126 01/20/2018   Lab Results  Component Value Date   HDL 33.10 (L) 01/20/2018   Lab Results  Component Value Date   LDLCALC 52 03/27/2016  Lab Results  Component Value Date   TRIG 299.0 (H) 01/20/2018   Lab Results  Component Value Date   CHOLHDL 4 01/20/2018   Lab Results  Component Value Date   HGBA1C 8.3 (H) 01/20/2018         Assessment & Plan:   Problem List Items Addressed This Visit    DM (diabetes mellitus), type 2 ([place])    hgba1c unacceptable, minimize simple carbs. Increase exercise as tolerated. Continue current meds      Relevant Medications   glimepiride (AMARYL) 2 MG tablet   Other  Relevant Orders   Hemoglobin A1c   HTN (hypertension)    Well controlled, no changes to meds. Encouraged heart healthy diet such as the DASH diet and exercise as tolerated.       Relevant Medications   amLODipine (NORVASC) 2.5 MG tablet   Other Relevant Orders   CBC   Comprehensive metabolic panel   TSH   Hyperlipidemia    Encouraged heart healthy diet, increase exercise, avoid trans fats, consider a krill oil cap daily      Relevant Medications   amLODipine (NORVASC) 2.5 MG tablet   Other Relevant Orders   Lipid panel   Gout    Check uric acid      Relevant Orders   Uric acid   Colon polyp    Colonoscopy at age 70 in [place] he has not followed up on repeat colonoscopy         I have changed Steve Beasley's glimepiride. I am also having him maintain his Vitamin D3, TART CHERRY ADVANCED, NON FORMULARY, albuterol, glucose blood, Vitamin D (Ergocalciferol), allopurinol, losartan, triamterene-hydrochlorothiazide, metFORMIN, atorvastatin, amLODipine, and glucose blood.  Meds ordered this encounter  Medications  . amLODipine (NORVASC) 2.5 MG tablet    Sig: Take 1 tablet (2.5 mg total) by mouth daily.    Dispense:  90 tablet    Refill:  1  . DISCONTD: glucose blood test strip    Sig: CHECK BLOOD SUGAR DAILY AND PRN.    Dispense:  100 each    Refill:  4    ONE TOUCH ULTRA BLUE TEST STRIPS.  Marland Kitchen DISCONTD: atorvastatin (LIPITOR) 10 MG tablet    Sig: TAKE 1 TABLET(10 MG) BY MOUTH AT BEDTIME    Dispense:  90 tablet    Refill:  0  . glimepiride (AMARYL) 2 MG tablet    Sig: Take 1 tablet (2 mg total) by mouth daily.    Dispense:  30 tablet    Refill:  5  . glucose blood test strip    Sig: CHECK BLOOD SUGAR DAILY AND PRN.    Dispense:  100 each    Refill:  4    ONE TOUCH ULTRA BLUE TEST STRIPS.    CMA served as Education administrator during this visit. History, Physical and Plan performed by medical provider. Documentation and orders reviewed and attested to.  Penni Homans, MD

## 2018-03-23 NOTE — Assessment & Plan Note (Signed)
Encouraged heart healthy diet, increase exercise, avoid trans fats, consider a krill oil cap daily 

## 2018-03-23 NOTE — Assessment & Plan Note (Signed)
hgba1c unacceptable, minimize simple carbs. Increase exercise as tolerated. Continue current meds 

## 2018-03-23 NOTE — Assessment & Plan Note (Signed)
Colonoscopy at age 52 in [place] he has not followed up on repeat colonoscopy

## 2018-03-23 NOTE — Assessment & Plan Note (Signed)
>>  ASSESSMENT AND PLAN FOR HTN (HYPERTENSION) WRITTEN ON 03/23/2018  4:44 PM BY BLYTH, STACEY A, MD  Well controlled, no changes to meds. Encouraged heart healthy diet such as the DASH diet and exercise as tolerated.

## 2018-03-23 NOTE — Patient Instructions (Signed)

## 2018-04-07 ENCOUNTER — Other Ambulatory Visit: Payer: Self-pay | Admitting: Family Medicine

## 2018-05-03 ENCOUNTER — Other Ambulatory Visit: Payer: Self-pay | Admitting: Family Medicine

## 2018-05-04 ENCOUNTER — Other Ambulatory Visit (INDEPENDENT_AMBULATORY_CARE_PROVIDER_SITE_OTHER): Payer: BLUE CROSS/BLUE SHIELD

## 2018-05-04 DIAGNOSIS — M109 Gout, unspecified: Secondary | ICD-10-CM | POA: Diagnosis not present

## 2018-05-04 DIAGNOSIS — I10 Essential (primary) hypertension: Secondary | ICD-10-CM | POA: Diagnosis not present

## 2018-05-04 DIAGNOSIS — E119 Type 2 diabetes mellitus without complications: Secondary | ICD-10-CM

## 2018-05-04 DIAGNOSIS — E785 Hyperlipidemia, unspecified: Secondary | ICD-10-CM

## 2018-05-04 LAB — LIPID PANEL
CHOLESTEROL: 97 mg/dL (ref 0–200)
HDL: 32 mg/dL — ABNORMAL LOW (ref >39.00)
LDL Cholesterol: 34 mg/dL (ref 0–99)
NonHDL: 64.79
TRIGLYCERIDES: 155 mg/dL — AB (ref 0.0–149.0)
Total CHOL/HDL Ratio: 3
VLDL: 31 mg/dL (ref 0.0–40.0)

## 2018-05-04 LAB — CBC
HEMATOCRIT: 44.2 % (ref 39.0–52.0)
Hemoglobin: 15.2 g/dL (ref 13.0–17.0)
MCHC: 34.3 g/dL (ref 30.0–36.0)
MCV: 87.7 fl (ref 78.0–100.0)
Platelets: 135 10*3/uL — ABNORMAL LOW (ref 150.0–400.0)
RBC: 5.04 Mil/uL (ref 4.22–5.81)
RDW: 13.1 % (ref 11.5–15.5)
WBC: 7.2 10*3/uL (ref 4.0–10.5)

## 2018-05-04 LAB — COMPREHENSIVE METABOLIC PANEL
ALBUMIN: 4.6 g/dL (ref 3.5–5.2)
ALT: 42 U/L (ref 0–53)
AST: 25 U/L (ref 0–37)
Alkaline Phosphatase: 88 U/L (ref 39–117)
BILIRUBIN TOTAL: 1.4 mg/dL — AB (ref 0.2–1.2)
BUN: 25 mg/dL — AB (ref 6–23)
CALCIUM: 11.2 mg/dL — AB (ref 8.4–10.5)
CHLORIDE: 99 meq/L (ref 96–112)
CO2: 27 mEq/L (ref 19–32)
CREATININE: 1.51 mg/dL — AB (ref 0.40–1.50)
GFR: 51.62 mL/min — ABNORMAL LOW (ref >60.00)
Glucose, Bld: 117 mg/dL — ABNORMAL HIGH (ref 70–99)
Potassium: 4.1 mEq/L (ref 3.5–5.1)
SODIUM: 135 meq/L (ref 135–145)
Total Protein: 7 g/dL (ref 6.0–8.3)

## 2018-05-04 LAB — URIC ACID: URIC ACID, SERUM: 7.3 mg/dL (ref 4.0–7.8)

## 2018-05-04 LAB — TSH: TSH: 2.65 u[IU]/mL (ref 0.35–4.50)

## 2018-05-04 LAB — HEMOGLOBIN A1C: HEMOGLOBIN A1C: 5.5 % (ref 4.6–6.5)

## 2018-05-07 ENCOUNTER — Ambulatory Visit: Payer: BLUE CROSS/BLUE SHIELD | Admitting: Family Medicine

## 2018-05-07 ENCOUNTER — Encounter: Payer: Self-pay | Admitting: Family Medicine

## 2018-05-07 VITALS — BP 120/76 | HR 63 | Temp 98.3°F | Resp 18 | Ht 71.0 in | Wt 222.0 lb

## 2018-05-07 DIAGNOSIS — R7989 Other specified abnormal findings of blood chemistry: Secondary | ICD-10-CM | POA: Diagnosis not present

## 2018-05-07 DIAGNOSIS — D696 Thrombocytopenia, unspecified: Secondary | ICD-10-CM | POA: Diagnosis not present

## 2018-05-07 DIAGNOSIS — M109 Gout, unspecified: Secondary | ICD-10-CM

## 2018-05-07 DIAGNOSIS — E559 Vitamin D deficiency, unspecified: Secondary | ICD-10-CM

## 2018-05-07 DIAGNOSIS — E119 Type 2 diabetes mellitus without complications: Secondary | ICD-10-CM | POA: Diagnosis not present

## 2018-05-07 DIAGNOSIS — I10 Essential (primary) hypertension: Secondary | ICD-10-CM

## 2018-05-07 DIAGNOSIS — E785 Hyperlipidemia, unspecified: Secondary | ICD-10-CM

## 2018-05-07 DIAGNOSIS — N289 Disorder of kidney and ureter, unspecified: Secondary | ICD-10-CM | POA: Diagnosis not present

## 2018-05-07 NOTE — Assessment & Plan Note (Signed)
hgba1c acceptable, minimize simple carbs. Increase exercise as tolerated. Continue current meds 

## 2018-05-07 NOTE — Assessment & Plan Note (Signed)
Encouraged heart healthy diet, increase exercise, avoid trans fats, consider a krill oil cap daily 

## 2018-05-07 NOTE — Assessment & Plan Note (Signed)
Monitor and hydrate 

## 2018-05-07 NOTE — Patient Instructions (Addendum)
MIIR water bottle 64 to 84 oz of water daily Chronic Kidney Disease, Adult Chronic kidney disease (CKD) occurs when the kidneys become damaged slowly over a long period of time. The kidneys are a pair of organs that do many important jobs in the body, including:  Removing waste and extra fluid from the blood to make urine.  Making hormones that maintain the amount of fluid in tissues and blood vessels.  Maintaining the right amount of fluids and chemicals in the body.  A small amount of kidney damage may not cause problems, but a large amount of damage may make it hard or impossible for the kidneys to work the way they should. If steps are not taken to slow down kidney damage or to stop it from getting worse, the kidneys may stop working permanently (end-stage renal disease or ESRD). Most of the time, CKD does not go away, but it can often be controlled. People who have CKD are usually able to live normal lives. What are the causes? The most common causes of this condition are diabetes and high blood pressure (hypertension). Other causes include:  Heart and blood vessel (cardiovascular) disease.  Kidney diseases, such as: ? Glomerulonephritis. ? Interstitial nephritis. ? Polycystic kidney disease. ? Renal vascular disease.  Diseases that affect the immune system.  Genetic diseases.  Medicines that damage the kidneys, such as anti-inflammatory medicines.  Being around or being in contact with poisonous (toxic) substances.  A kidney or urinary infection that occurs again and again (recurs).  Vasculitis. This is swelling or inflammation of the blood vessels.  A problem with urine flow that may be caused by: ? Cancer. ? Having kidney stones more than one time. ? An enlarged prostate, in males.  What increases the risk? You are more likely to develop this condition if you:  Are older than age 13.  Are male.  Are African-American, Hispanic, Asian, [place], or  American [place].  Are a current or former smoker.  Are obese.  Have a family history of kidney disease or failure.  Often take medicines that are damaging to the kidneys.  What are the signs or symptoms? Symptoms of this condition include:  Swelling (edema) of the face, legs, ankles, or feet.  Tiredness (lethargy) and having less energy.  Nausea or vomiting.  Confusion or trouble concentrating.  Problems with urination, such as: ? Painful or burning feeling during urination. ? Decreased urine production. ? Frequent urination, especially at night. ? Bloody urine.  Muscle twitches and cramps, especially in the legs.  Shortness of breath.  Weakness.  Loss of appetite.  Metallic taste in the mouth.  Trouble sleeping.  Dry, itchy skin.  A low blood count (anemia).  Pale lining of the eyelids and surface of the eye (conjunctiva).  Symptoms develop slowly and may not be obvious until the kidney damage becomes severe. It is possible to have kidney disease for years without having any symptoms. How is this diagnosed? This condition may be diagnosed based on:  Blood tests.  Urine tests.  Imaging tests, such as an ultrasound or CT scan.  A test in which a sample of tissue is removed from the kidneys to be examined under a microscope (kidney biopsy).  These test results will help your health care provider determine how serious the CKD is. How is this treated? There is no cure for most cases of this condition, but treatment usually relieves symptoms and prevents or slows the progression of the disease. Treatment may  include:  Making diet changes, which may require you to avoid alcohol, salty foods (sodium), and foods that are high in potassium, calcium, and protein.  Medicines: ? To lower blood pressure. ? To control blood glucose. ? To relieve anemia. ? To relieve swelling. ? To protect your bones. ? To improve the balance of electrolytes in your  blood.  Removing toxic waste from the body through types of dialysis, if the kidneys can no longer do their job (kidney failure).  Managing any other conditions that are causing your CKD or making it worse.  Follow these instructions at home: Medicines  Take over-the-counter and prescription medicines only as told by your health care provider. The dose of some medicines that you take may need to be adjusted.  Do not take any new medicines unless approved by your health care provider. Many medicines can worsen your kidney damage.  Do not take any vitamin and mineral supplements unless approved by your health care provider. Many nutritional supplements can worsen your kidney damage. General instructions  Follow your prescribed diet as told by your health care provider.  Do not use any products that contain nicotine or tobacco, such as cigarettes and e-cigarettes. If you need help quitting, ask your health care provider.  Monitor and track your blood pressure at home. Report changes in your blood pressure as told by your health care provider.  If you are being treated for diabetes, monitor and track your blood sugar (blood glucose) levels as told by your health care provider.  Maintain a healthy weight. If you need help with this, ask your health care provider.  Start or continue an exercise plan. Exercise at least 30 minutes a day, 5 days a week.  Keep your immunizations up to date as told by your health care provider.  Keep all follow-up visits as told by your health care provider. This is important. Where to find more information:  American Association of Kidney Patients: BombTimer.gl  National Kidney Foundation: www.kidney.[place]: https://mathis.com/  [facility]: www.lifeoptions.org and www.kidneyschool.org Contact a health care provider if:  Your symptoms get worse.  You develop new symptoms. Get help right away if:  You develop  symptoms of ESRD, which include: ? Headaches. ? Numbness in the hands or feet. ? Easy bruising. ? Frequent hiccups. ? Chest pain. ? Shortness of breath. ? Lack of menstruation, in women.  You have a fever.  You have decreased urine production.  You have pain or bleeding when you urinate. Summary  Chronic kidney disease (CKD) occurs when the kidneys become damaged slowly over a long period of time.  The most common causes of this condition are diabetes and high blood pressure (hypertension).  There is no cure for most cases of this condition, but treatment usually relieves symptoms and prevents or slows the progression of the disease. Treatment may include a combination of medicines and lifestyle changes. This information is not intended to replace advice given to you by your health care provider. Make sure you discuss any questions you have with your health care provider. Document Released: 06/25/2008 Document Revised: 10/24/2016 Document Reviewed: 10/24/2016 Elsevier Interactive Patient Education  [employer].

## 2018-05-08 LAB — CBC WITH DIFFERENTIAL/PLATELET
BASOS PCT: 0.6 % (ref 0.0–3.0)
Basophils Absolute: 0.1 10*3/uL (ref 0.0–0.1)
EOS ABS: 0 10*3/uL (ref 0.0–0.7)
Eosinophils Relative: 0.4 % (ref 0.0–5.0)
HEMATOCRIT: 43 % (ref 39.0–52.0)
HEMOGLOBIN: 14.8 g/dL (ref 13.0–17.0)
LYMPHS PCT: 23.2 % (ref 12.0–46.0)
Lymphs Abs: 2.4 10*3/uL (ref 0.7–4.0)
MCHC: 34.4 g/dL (ref 30.0–36.0)
MCV: 87.3 fl (ref 78.0–100.0)
MONOS PCT: 6.4 % (ref 3.0–12.0)
Monocytes Absolute: 0.7 10*3/uL (ref 0.1–1.0)
NEUTROS ABS: 7.3 10*3/uL (ref 1.4–7.7)
Neutrophils Relative %: 69.4 % (ref 43.0–77.0)
PLATELETS: 140 10*3/uL — AB (ref 150.0–400.0)
RBC: 4.93 Mil/uL (ref 4.22–5.81)
RDW: 12.9 % (ref 11.5–15.5)
WBC: 10.5 10*3/uL (ref 4.0–10.5)

## 2018-05-08 LAB — COMPREHENSIVE METABOLIC PANEL
ALT: 38 U/L (ref 0–53)
AST: 23 U/L (ref 0–37)
Albumin: 4.6 g/dL (ref 3.5–5.2)
Alkaline Phosphatase: 85 U/L (ref 39–117)
BUN: 27 mg/dL — ABNORMAL HIGH (ref 6–23)
CALCIUM: 11.3 mg/dL — AB (ref 8.4–10.5)
CHLORIDE: 98 meq/L (ref 96–112)
CO2: 28 mEq/L (ref 19–32)
Creatinine, Ser: 1.49 mg/dL (ref 0.40–1.50)
GFR: 52.42 mL/min — AB (ref >60.00)
Glucose, Bld: 72 mg/dL (ref 70–99)
POTASSIUM: 3.7 meq/L (ref 3.5–5.1)
Sodium: 136 mEq/L (ref 135–145)
Total Bilirubin: 1.3 mg/dL — ABNORMAL HIGH (ref 0.2–1.2)
Total Protein: 7.1 g/dL (ref 6.0–8.3)

## 2018-05-08 LAB — VITAMIN D 25 HYDROXY (VIT D DEFICIENCY, FRACTURES): VITD: 44.17 ng/mL (ref 30.00–100.00)

## 2018-05-10 DIAGNOSIS — R7989 Other specified abnormal findings of blood chemistry: Secondary | ICD-10-CM | POA: Insufficient documentation

## 2018-05-10 DIAGNOSIS — N289 Disorder of kidney and ureter, unspecified: Secondary | ICD-10-CM | POA: Insufficient documentation

## 2018-05-10 DIAGNOSIS — D696 Thrombocytopenia, unspecified: Secondary | ICD-10-CM | POA: Insufficient documentation

## 2018-05-10 NOTE — Assessment & Plan Note (Signed)
>>  ASSESSMENT AND PLAN FOR HTN (HYPERTENSION) WRITTEN ON 05/10/2018  8:43 PM BY BLYTH, STACEY A, MD  Well controlled, no changes to meds. Encouraged heart healthy diet such as the DASH diet and exercise as tolerated.

## 2018-05-10 NOTE — Assessment & Plan Note (Signed)
Encouraged to avoid all over the counter supplements and hydrate well every day. Will continue to monitor

## 2018-05-10 NOTE — Assessment & Plan Note (Signed)
Well controlled, no changes to meds. Encouraged heart healthy diet such as the DASH diet and exercise as tolerated.  °

## 2018-05-10 NOTE — Assessment & Plan Note (Signed)
Mild and asymptomatic will monitor

## 2018-05-10 NOTE — Progress Notes (Signed)
Subjective:    Patient ID: Steve Beasley, male    DOB: Mar 15, 1966, 52 y.o.   MRN: [id number]  No chief complaint on file.   HPI Patient is in today for review of labs and follow up on chronic medical concerns. No recent febrile illness or hospitalizations. He has been eating well and exercising more since he found out his hgba1c was above 8 and he is very pleased with it now being below 6. No polyuria no polydipsia. He feels well. Denies CP/palp/SOB/HA/congestion/fevers/GI or GU c/o. Taking meds as prescribed. No signs of bleeding or bruising  Past Medical History:  Diagnosis Date  . Allergy-induced asthma   . Anxiety state, unspecified 09/19/2013  . Atypical chest pain 11/13/2014  . Basal cell carcinoma of scalp 09/09/2016  . Diabetes mellitus without complication ([place])   . Fatty liver disease, nonalcoholic 09/22/8249  . Gout 09/18/2014  . Hypertension   . Nicotine use disorder 06/06/2013   smokeless  . Obesity, unspecified 06/06/2013  . Osteopenia   . Other and unspecified hyperlipidemia 01/19/2014  . Preventative health care 09/19/2013  . Right heart enlargement 09/18/2014  . Testicular cancer ([place])   . Tinnitus 03/24/2017  . Valvular heart disease 09/18/2014    Past Surgical History:  Procedure Laterality Date  . BASAL CELL CARCINOMA EXCISION     Within 6 months  . SKIN CANCER EXCISION     Within 6 months  . testicular cancer surgery      Family History  Problem Relation Age of Onset  . Diabetes Mother   . Breast cancer Mother   . Bone cancer Mother   . Cancer Mother        breast with bone mets  . Depression Mother   . Hypertension Father   . Prostate cancer Father   . Hyperlipidemia Father   . Cancer Father        prostate  . Heart attack Neg Hx   . Sudden death Neg Hx   . Stroke Paternal Grandfather   . Prostate cancer Maternal Grandfather   . Hypertension Brother   . Gallstones Brother   . Anxiety disorder Daughter   . ADD / ADHD Daughter   .  Bulemia Daughter   . Depression Son   . Anxiety disorder Maternal Aunt        social anxiety, anger  . Stroke Maternal Uncle 73    Social History   Socioeconomic History  . Marital status: Married    Spouse name: Not on file  . Number of children: 2  . Years of education: Not on file  . Highest education level: Not on file  Occupational History  . Occupation: Special educational needs teacher Needs  . Financial resource strain: Not on file  . Food insecurity:    Worry: Not on file    Inability: Not on file  . Transportation needs:    Medical: Not on file    Non-medical: Not on file  Tobacco Use  . Smoking status: Former Smoker    Types: Cigarettes  . Smokeless tobacco: Former Systems developer    Quit date: 02/28/2014  . Tobacco comment: 30 years ago socially  Substance and Sexual Activity  . Alcohol use: Yes    Alcohol/week: 0.0 standard drinks  . Drug use: No  . Sexual activity: Yes    Comment: lives with wife, travels with work, no dietary restrictions , exercise regular  Lifestyle  . Physical activity:    Days per week: Not on  file    Minutes per session: Not on file  . Stress: Not on file  Relationships  . Social connections:    Talks on phone: Not on file    Gets together: Not on file    Attends religious service: Not on file    Active member of club or organization: Not on file    Attends meetings of clubs or organizations: Not on file    Relationship status: Not on file  . Intimate partner violence:    Fear of current or ex partner: Not on file    Emotionally abused: Not on file    Physically abused: Not on file    Forced sexual activity: Not on file  Other Topics Concern  . Not on file  Social History Narrative  . Not on file    Outpatient Medications Prior to Visit  Medication Sig Dispense Refill  . albuterol (PROVENTIL HFA;VENTOLIN HFA) 108 (90 Base) MCG/ACT inhaler Inhale 2 puffs into the lungs every 6 (six) hours as needed for wheezing or shortness of breath. 1 Inhaler 2    . allopurinol (ZYLOPRIM) 100 MG tablet TAKE 1 TABLET(100 MG) BY MOUTH TWICE DAILY 180 tablet 1  . amLODipine (NORVASC) 2.5 MG tablet Take 1 tablet (2.5 mg total) by mouth daily. 90 tablet 1  . atorvastatin (LIPITOR) 10 MG tablet TAKE 1 TABLET BY MOUTH EVERY NIGHT AT BEDTIME 90 tablet 0  . Cholecalciferol (VITAMIN D3) 5000 UNITS CAPS Take 1 capsule by mouth daily.    Marland Kitchen glimepiride (AMARYL) 2 MG tablet Take 1 tablet (2 mg total) by mouth daily. 30 tablet 5  . glucose blood (ONE TOUCH ULTRA TEST) test strip USE AS DIRECTED TO TEST BLOOD SUGAR EVERY DAY AND AS NEEDED 100 each 0  . glucose blood test strip CHECK BLOOD SUGAR DAILY AND PRN. 100 each 4  . losartan (COZAAR) 100 MG tablet TAKE 1 TABLET(100 MG) BY MOUTH DAILY 90 tablet 0  . metFORMIN (GLUCOPHAGE) 1000 MG tablet Take 1 tablet (1,000 mg total) by mouth 2 (two) times daily with a meal. 60 tablet 5  . triamterene-hydrochlorothiazide (MAXZIDE-25) 37.5-25 MG tablet TAKE 1 TABLET BY MOUTH DAILY 90 tablet 0  . Misc Natural Products (TART CHERRY ADVANCED) CAPS 2 (two) times daily. 250 mg    . NON FORMULARY curamin Takes 3 per day    . Vitamin D, Ergocalciferol, (DRISDOL) 50000 units CAPS capsule TAKE 1 CAPSULE BY MOUTH EVERY 7 DAYS 4 capsule 0   No facility-administered medications prior to visit.     No Known Allergies  Review of Systems  Constitutional: Negative for fever and malaise/fatigue.  HENT: Negative for congestion.   Eyes: Negative for blurred vision.  Respiratory: Negative for shortness of breath.   Cardiovascular: Negative for chest pain, palpitations and leg swelling.  Gastrointestinal: Negative for abdominal pain, blood in stool and nausea.  Genitourinary: Negative for dysuria and frequency.  Musculoskeletal: Negative for falls.  Skin: Negative for rash.  Neurological: Negative for dizziness, loss of consciousness and headaches.  Endo/Heme/Allergies: Negative for environmental allergies.  Psychiatric/Behavioral: Negative  for depression. The patient is not nervous/anxious.        Objective:    Physical Exam  Constitutional: He is oriented to person, place, and time. He appears well-developed and well-nourished. No distress.  HENT:  Head: Normocephalic and atraumatic.  Nose: Nose normal.  Eyes: Right eye exhibits no discharge. Left eye exhibits no discharge.  Neck: Normal range of motion. Neck supple.  Cardiovascular: Normal  rate and regular rhythm.  No murmur heard. Pulmonary/Chest: Effort normal and breath sounds normal.  Abdominal: Soft. Bowel sounds are normal. There is no tenderness.  Musculoskeletal: He exhibits no edema.  Neurological: He is alert and oriented to person, place, and time.  Skin: Skin is warm and dry.  Psychiatric: He has a normal mood and affect.  Nursing note and vitals reviewed.   BP 120/76 (BP Location: Left Arm, Patient Position: Sitting, Cuff Size: Normal)   Pulse 63   Temp 98.3 F (36.8 C) (Oral)   Resp 18   Ht 5\' 11"  (1.803 m)   Wt 222 lb (100.7 kg)   SpO2 98%   BMI 30.96 kg/m  Wt Readings from Last 3 Encounters:  05/07/18 222 lb (100.7 kg)  03/23/18 224 lb 3.2 oz (101.7 kg)  05/15/17 229 lb 3.2 oz (104 kg)     Lab Results  Component Value Date   WBC 10.5 05/07/2018   HGB 14.8 05/07/2018   HCT 43.0 05/07/2018   PLT 140.0 (L) 05/07/2018   GLUCOSE 72 05/07/2018   CHOL 97 05/04/2018   TRIG 155.0 (H) 05/04/2018   HDL 32.00 (L) 05/04/2018   LDLDIRECT 71.0 01/20/2018   LDLCALC 34 05/04/2018   ALT 38 05/07/2018   AST 23 05/07/2018   NA 136 05/07/2018   K 3.7 05/07/2018   CL 98 05/07/2018   CREATININE 1.49 05/07/2018   BUN 27 (H) 05/07/2018   CO2 28 05/07/2018   TSH 2.65 05/04/2018   PSA 2.90 03/27/2016   HGBA1C 5.5 05/04/2018   MICROALBUR 1.3 08/21/2015    Lab Results  Component Value Date   TSH 2.65 05/04/2018   Lab Results  Component Value Date   WBC 10.5 05/07/2018   HGB 14.8 05/07/2018   HCT 43.0 05/07/2018   MCV 87.3 05/07/2018    PLT 140.0 (L) 05/07/2018   Lab Results  Component Value Date   NA 136 05/07/2018   K 3.7 05/07/2018   CO2 28 05/07/2018   GLUCOSE 72 05/07/2018   BUN 27 (H) 05/07/2018   CREATININE 1.49 05/07/2018   BILITOT 1.3 (H) 05/07/2018   ALKPHOS 85 05/07/2018   AST 23 05/07/2018   ALT 38 05/07/2018   PROT 7.1 05/07/2018   ALBUMIN 4.6 05/07/2018   CALCIUM 11.3 (H) 05/07/2018   GFR 52.42 (L) 05/07/2018   Lab Results  Component Value Date   CHOL 97 05/04/2018   Lab Results  Component Value Date   HDL 32.00 (L) 05/04/2018   Lab Results  Component Value Date   LDLCALC 34 05/04/2018   Lab Results  Component Value Date   TRIG 155.0 (H) 05/04/2018   Lab Results  Component Value Date   CHOLHDL 3 05/04/2018   Lab Results  Component Value Date   HGBA1C 5.5 05/04/2018       Assessment & Plan:   Problem List Items Addressed This Visit    DM (diabetes mellitus), type 2 ([place])    hgba1c acceptable, minimize simple carbs. Increase exercise as tolerated. Continue current meds      HTN (hypertension)    Well controlled, no changes to meds. Encouraged heart healthy diet such as the DASH diet and exercise as tolerated.       Vitamin D deficiency    Supplement and monitor      Hyperlipidemia    Encouraged heart healthy diet, increase exercise, avoid trans fats, consider a krill oil cap daily      Gout  Monitor and hydrate      Renal insufficiency - Primary    Encouraged to avoid all over the counter supplements and hydrate well every day. Will continue to monitor      Relevant Orders   Comprehensive metabolic panel (Completed)   Low vitamin D level   Relevant Orders   VITAMIN D 25 Hydroxy (Vit-D Deficiency, Fractures) (Completed)   Thrombocytopenia (HCC)    Mild and asymptomatic will monitor      Relevant Orders   CBC with Differential/Platelet (Completed)      I have discontinued Steve Beasley. Kuna's TART CHERRY ADVANCED, NON FORMULARY, and Vitamin D  (Ergocalciferol). I am also having him maintain his Vitamin D3, albuterol, glucose blood, allopurinol, metFORMIN, amLODipine, glimepiride, glucose blood, losartan, triamterene-hydrochlorothiazide, and atorvastatin.  No orders of the defined types were placed in this encounter.    Penni Homans, MD

## 2018-05-10 NOTE — Assessment & Plan Note (Signed)
Supplement and monitor 

## 2018-07-06 ENCOUNTER — Other Ambulatory Visit: Payer: Self-pay | Admitting: Family Medicine

## 2018-07-07 ENCOUNTER — Ambulatory Visit: Payer: BLUE CROSS/BLUE SHIELD | Admitting: Family Medicine

## 2018-07-08 ENCOUNTER — Other Ambulatory Visit: Payer: Self-pay | Admitting: Family Medicine

## 2018-07-17 ENCOUNTER — Other Ambulatory Visit: Payer: Self-pay | Admitting: Family Medicine

## 2018-07-23 ENCOUNTER — Ambulatory Visit: Payer: BLUE CROSS/BLUE SHIELD | Admitting: Family Medicine

## 2018-07-23 VITALS — BP 108/62 | HR 67 | Temp 97.7°F | Resp 18 | Wt 218.4 lb

## 2018-07-23 DIAGNOSIS — Z0184 Encounter for antibody response examination: Secondary | ICD-10-CM

## 2018-07-23 DIAGNOSIS — E785 Hyperlipidemia, unspecified: Secondary | ICD-10-CM

## 2018-07-23 DIAGNOSIS — M8580 Other specified disorders of bone density and structure, unspecified site: Secondary | ICD-10-CM

## 2018-07-23 DIAGNOSIS — E559 Vitamin D deficiency, unspecified: Secondary | ICD-10-CM

## 2018-07-23 DIAGNOSIS — I10 Essential (primary) hypertension: Secondary | ICD-10-CM | POA: Diagnosis not present

## 2018-07-23 DIAGNOSIS — E119 Type 2 diabetes mellitus without complications: Secondary | ICD-10-CM

## 2018-07-23 DIAGNOSIS — Z23 Encounter for immunization: Secondary | ICD-10-CM | POA: Diagnosis not present

## 2018-07-23 DIAGNOSIS — D696 Thrombocytopenia, unspecified: Secondary | ICD-10-CM

## 2018-07-23 DIAGNOSIS — R7989 Other specified abnormal findings of blood chemistry: Secondary | ICD-10-CM

## 2018-07-23 DIAGNOSIS — F17200 Nicotine dependence, unspecified, uncomplicated: Secondary | ICD-10-CM

## 2018-07-23 DIAGNOSIS — M109 Gout, unspecified: Secondary | ICD-10-CM

## 2018-07-23 LAB — COMPREHENSIVE METABOLIC PANEL
ALBUMIN: 4.7 g/dL (ref 3.5–5.2)
ALK PHOS: 77 U/L (ref 39–117)
ALT: 50 U/L (ref 0–53)
AST: 25 U/L (ref 0–37)
BILIRUBIN TOTAL: 1.8 mg/dL — AB (ref 0.2–1.2)
BUN: 27 mg/dL — AB (ref 6–23)
CO2: 29 mEq/L (ref 19–32)
Calcium: 11.4 mg/dL — ABNORMAL HIGH (ref 8.4–10.5)
Chloride: 96 mEq/L (ref 96–112)
Creatinine, Ser: 1.38 mg/dL (ref 0.40–1.50)
GFR: 57.22 mL/min — AB (ref >60.00)
GLUCOSE: 122 mg/dL — AB (ref 70–99)
Potassium: 4.2 mEq/L (ref 3.5–5.1)
Sodium: 133 mEq/L — ABNORMAL LOW (ref 135–145)
TOTAL PROTEIN: 7.2 g/dL (ref 6.0–8.3)

## 2018-07-23 NOTE — Assessment & Plan Note (Signed)
No recent episodes

## 2018-07-23 NOTE — Assessment & Plan Note (Signed)
Consumes an occasional cigar, encouraged cessation

## 2018-07-23 NOTE — Assessment & Plan Note (Signed)
Well controlled, no changes to meds. Encouraged heart healthy diet such as the DASH diet and exercise as tolerated.  °

## 2018-07-23 NOTE — Assessment & Plan Note (Signed)
He travels internationally for work check measles titer for immunity

## 2018-07-23 NOTE — Assessment & Plan Note (Signed)
Encouraged to get adequate exercise, calcium and vitamin d intake. Repeat Dexa scan

## 2018-07-23 NOTE — Progress Notes (Signed)
Subjective:    Patient ID: Steve Beasley, male    DOB: 07/16/1966, 52 y.o.   MRN: [id number]  No chief complaint on file.   HPI Patient is in today for follow-up.  Overall he feels well.  No recent febrile illness or hospitalizations.  He is traveling more internationally for work recently but is tolerating it well.  He notes his blood sugars continue to be improved.  He notes a morning sugar often between 110 140 and a midday sugar between 80 and 120.  No polyuria or polydipsia.  He tries to stay active and avoid simple carbohydrates. Denies CP/palp/SOB/HA/congestion/fevers/GI or GU c/o. Taking meds as prescribed  Past Medical History:  Diagnosis Date  . Allergy-induced asthma   . Anxiety state, unspecified 09/19/2013  . Atypical chest pain 11/13/2014  . Basal cell carcinoma of scalp 09/09/2016  . Diabetes mellitus without complication ([place])   . Fatty liver disease, nonalcoholic 08/67/6195  . Gout 09/18/2014  . Hypertension   . Nicotine use disorder 06/06/2013   smokeless  . Obesity, unspecified 06/06/2013  . Osteopenia   . Other and unspecified hyperlipidemia 01/19/2014  . Preventative health care 09/19/2013  . Right heart enlargement 09/18/2014  . Testicular cancer ([place])   . Tinnitus 03/24/2017  . Valvular heart disease 09/18/2014    Past Surgical History:  Procedure Laterality Date  . BASAL CELL CARCINOMA EXCISION     Within 6 months  . SKIN CANCER EXCISION     Within 6 months  . testicular cancer surgery      Family History  Problem Relation Age of Onset  . Diabetes Mother   . Breast cancer Mother   . Bone cancer Mother   . Cancer Mother        breast with bone mets  . Depression Mother   . Hypertension Father   . Prostate cancer Father   . Hyperlipidemia Father   . Cancer Father        prostate  . Heart attack Neg Hx   . Sudden death Neg Hx   . Stroke Paternal Grandfather   . Prostate cancer Maternal Grandfather   . Hypertension Brother   . Gallstones  Brother   . Anxiety disorder Daughter   . ADD / ADHD Daughter   . Bulemia Daughter   . Depression Son   . Anxiety disorder Maternal Aunt        social anxiety, anger  . Stroke Maternal Uncle 76    Social History   Socioeconomic History  . Marital status: Married    Spouse name: Not on file  . Number of children: 2  . Years of education: Not on file  . Highest education level: Not on file  Occupational History  . Occupation: Special educational needs teacher Needs  . Financial resource strain: Not on file  . Food insecurity:    Worry: Not on file    Inability: Not on file  . Transportation needs:    Medical: Not on file    Non-medical: Not on file  Tobacco Use  . Smoking status: Former Smoker    Types: Cigarettes  . Smokeless tobacco: Former Systems developer    Quit date: 02/28/2014  . Tobacco comment: 30 years ago socially  Substance and Sexual Activity  . Alcohol use: Yes    Alcohol/week: 0.0 standard drinks  . Drug use: No  . Sexual activity: Yes    Comment: lives with wife, travels with work, no dietary restrictions , exercise regular  Lifestyle  . Physical activity:    Days per week: Not on file    Minutes per session: Not on file  . Stress: Not on file  Relationships  . Social connections:    Talks on phone: Not on file    Gets together: Not on file    Attends religious service: Not on file    Active member of club or organization: Not on file    Attends meetings of clubs or organizations: Not on file    Relationship status: Not on file  . Intimate partner violence:    Fear of current or ex partner: Not on file    Emotionally abused: Not on file    Physically abused: Not on file    Forced sexual activity: Not on file  Other Topics Concern  . Not on file  Social History Narrative  . Not on file    Outpatient Medications Prior to Visit  Medication Sig Dispense Refill  . albuterol (PROVENTIL HFA;VENTOLIN HFA) 108 (90 Base) MCG/ACT inhaler Inhale 2 puffs into the lungs every 6  (six) hours as needed for wheezing or shortness of breath. 1 Inhaler 2  . allopurinol (ZYLOPRIM) 100 MG tablet TAKE 1 TABLET(100 MG) BY MOUTH TWICE DAILY 180 tablet 1  . amLODipine (NORVASC) 2.5 MG tablet Take 1 tablet (2.5 mg total) by mouth daily. 90 tablet 1  . atorvastatin (LIPITOR) 10 MG tablet TAKE 1 TABLET BY MOUTH EVERY NIGHT AT BEDTIME 90 tablet 0  . Cholecalciferol (VITAMIN D3) 5000 UNITS CAPS Take 1 capsule by mouth daily.    Marland Kitchen glimepiride (AMARYL) 2 MG tablet Take 1 tablet (2 mg total) by mouth daily. 30 tablet 5  . glucose blood (ONE TOUCH ULTRA TEST) test strip USE AS DIRECTED TO TEST BLOOD SUGAR EVERY DAY AND AS NEEDED 100 each 0  . glucose blood test strip CHECK BLOOD SUGAR DAILY AND PRN. 100 each 4  . losartan (COZAAR) 100 MG tablet TAKE 1 TABLET(100 MG) BY MOUTH DAILY 90 tablet 0  . metFORMIN (GLUCOPHAGE) 1000 MG tablet TAKE 1 TABLET(1000 MG) BY MOUTH TWICE DAILY WITH A MEAL 60 tablet 0  . triamterene-hydrochlorothiazide (MAXZIDE-25) 37.5-25 MG tablet TAKE 1 TABLET BY MOUTH DAILY 90 tablet 0  . allopurinol (ZYLOPRIM) 100 MG tablet TAKE 1 TABLET(100 MG) BY MOUTH TWICE DAILY 180 tablet 0   No facility-administered medications prior to visit.     No Known Allergies  Review of Systems  Constitutional: Negative for fever and malaise/fatigue.  HENT: Negative for congestion.   Eyes: Negative for blurred vision.  Respiratory: Negative for shortness of breath.   Cardiovascular: Negative for chest pain, palpitations and leg swelling.  Gastrointestinal: Negative for abdominal pain, blood in stool and nausea.  Genitourinary: Negative for dysuria and frequency.  Musculoskeletal: Negative for falls.  Skin: Negative for rash.  Neurological: Negative for dizziness, loss of consciousness and headaches.  Endo/Heme/Allergies: Negative for environmental allergies.  Psychiatric/Behavioral: Negative for depression. The patient is not nervous/anxious.        Objective:    Physical Exam   Constitutional: He is oriented to person, place, and time. He appears well-developed and well-nourished. No distress.  HENT:  Head: Normocephalic and atraumatic.  Nose: Nose normal.  Eyes: Right eye exhibits no discharge. Left eye exhibits no discharge.  Neck: Normal range of motion. Neck supple.  Cardiovascular: Normal rate and regular rhythm.  No murmur heard. Pulmonary/Chest: Effort normal and breath sounds normal.  Abdominal: Soft. Bowel sounds are normal. There  is no tenderness.  Musculoskeletal: He exhibits no edema.  Neurological: He is alert and oriented to person, place, and time.  Skin: Skin is warm and dry.  Psychiatric: He has a normal mood and affect.  Nursing note and vitals reviewed.   BP 108/62 (BP Location: Left Arm, Patient Position: Sitting, Cuff Size: Normal)   Pulse 67   Temp 97.7 F (36.5 C) (Oral)   Resp 18   Wt 218 lb 6.4 oz (99.1 kg)   SpO2 98%   BMI 30.46 kg/m  Wt Readings from Last 3 Encounters:  07/23/18 218 lb 6.4 oz (99.1 kg)  05/07/18 222 lb (100.7 kg)  03/23/18 224 lb 3.2 oz (101.7 kg)     Lab Results  Component Value Date   WBC 10.5 05/07/2018   HGB 14.8 05/07/2018   HCT 43.0 05/07/2018   PLT 140.0 (L) 05/07/2018   GLUCOSE 72 05/07/2018   CHOL 97 05/04/2018   TRIG 155.0 (H) 05/04/2018   HDL 32.00 (L) 05/04/2018   LDLDIRECT 71.0 01/20/2018   LDLCALC 34 05/04/2018   ALT 38 05/07/2018   AST 23 05/07/2018   NA 136 05/07/2018   K 3.7 05/07/2018   CL 98 05/07/2018   CREATININE 1.49 05/07/2018   BUN 27 (H) 05/07/2018   CO2 28 05/07/2018   TSH 2.65 05/04/2018   PSA 2.90 03/27/2016   HGBA1C 5.5 05/04/2018   MICROALBUR 1.3 08/21/2015    Lab Results  Component Value Date   TSH 2.65 05/04/2018   Lab Results  Component Value Date   WBC 10.5 05/07/2018   HGB 14.8 05/07/2018   HCT 43.0 05/07/2018   MCV 87.3 05/07/2018   PLT 140.0 (L) 05/07/2018   Lab Results  Component Value Date   NA 136 05/07/2018   K 3.7 05/07/2018    CO2 28 05/07/2018   GLUCOSE 72 05/07/2018   BUN 27 (H) 05/07/2018   CREATININE 1.49 05/07/2018   BILITOT 1.3 (H) 05/07/2018   ALKPHOS 85 05/07/2018   AST 23 05/07/2018   ALT 38 05/07/2018   PROT 7.1 05/07/2018   ALBUMIN 4.6 05/07/2018   CALCIUM 11.3 (H) 05/07/2018   GFR 52.42 (L) 05/07/2018   Lab Results  Component Value Date   CHOL 97 05/04/2018   Lab Results  Component Value Date   HDL 32.00 (L) 05/04/2018   Lab Results  Component Value Date   LDLCALC 34 05/04/2018   Lab Results  Component Value Date   TRIG 155.0 (H) 05/04/2018   Lab Results  Component Value Date   CHOLHDL 3 05/04/2018   Lab Results  Component Value Date   HGBA1C 5.5 05/04/2018       Assessment & Plan:   Problem List Items Addressed This Visit    DM (diabetes mellitus), type 2 ([place])    hgba1c acceptable, minimize simple carbs. Increase exercise as tolerated. Continue current meds      Relevant Orders   Hemoglobin A1c   HTN (hypertension) - Primary    Well controlled, no changes to meds. Encouraged heart healthy diet such as the DASH diet and exercise as tolerated.       Relevant Orders   TSH   CBC with Differential/Platelet   Comprehensive metabolic panel   Osteopenia    Encouraged to get adequate exercise, calcium and vitamin d intake. Repeat Dexa scan      Relevant Orders   DG Bone Density   Vitamin D deficiency   Relevant Orders   DG Bone  Density   VITAMIN D 25 Hydroxy (Vit-D Deficiency, Fractures)   Nicotine use disorder    Consumes an occasional cigar, encouraged cessation      Immunity status testing    He travels internationally for work check measles titer for immunity      Relevant Orders   Measles ([place]) Antibody IgG   Hyperlipidemia    Maintain heart healthy diet, increase exercise, avoid trans fats, consider a krill oil cap daily      Relevant Orders   Lipid panel   Gout    No recent episodes      Relevant Orders   Uric acid   Low vitamin D level     Supplement and monitor      Thrombocytopenia (HCC)    Asymptomatic repeat cbc      Hypercalcemia    Repeat cmp and check PTH      Relevant Orders   PTH, Intact and Calcium   Comprehensive metabolic panel   VITAMIN D 25 Hydroxy (Vit-D Deficiency, Fractures)   PTH, Intact and Calcium      I am having Luciano Cutter. Larene [place] maintain his Vitamin D3, albuterol, glucose blood, allopurinol, amLODipine, glimepiride, glucose blood, atorvastatin, losartan, triamterene-hydrochlorothiazide, and metFORMIN.  No orders of the defined types were placed in this encounter.    Penni Homans, MD

## 2018-07-23 NOTE — Assessment & Plan Note (Signed)
Supplement and monitor 

## 2018-07-23 NOTE — Assessment & Plan Note (Signed)
hgba1c acceptable, minimize simple carbs. Increase exercise as tolerated. Continue current meds 

## 2018-07-23 NOTE — Assessment & Plan Note (Signed)
Repeat cmp and check PTH

## 2018-07-23 NOTE — Assessment & Plan Note (Signed)
>>  ASSESSMENT AND PLAN FOR HTN (HYPERTENSION) WRITTEN ON 07/23/2018 12:38 PM BY BLYTH, STACEY A, MD  Well controlled, no changes to meds. Encouraged heart healthy diet such as the DASH diet and exercise as tolerated.

## 2018-07-23 NOTE — Assessment & Plan Note (Signed)
Asymptomatic repeat cbc

## 2018-07-23 NOTE — Assessment & Plan Note (Signed)
Maintain heart healthy diet, increase exercise, avoid trans fats, consider a krill oil cap daily 

## 2018-07-23 NOTE — Patient Instructions (Signed)
Hypercalcemia  Hypercalcemia is having too much calcium in the blood. The body needs calcium to make bones and keep them strong. Calcium also helps the muscles, nerves, brain, and heart work the way they should.  Most of the calcium in the body is in the bones. There is also some calcium in the blood. Hypercalcemia can happen when calcium comes out of the bones, or when the kidneys are not able to remove calcium from the blood. Hypercalcemia can be mild or severe.  What are the causes?  There are many possible causes of hypercalcemia. Common causes include:   Hyperparathyroidism. This is a condition in which the body produces too much parathyroid hormone. There are four parathyroid glands in your neck. These glands produce a chemical messenger (hormone) that helps the body absorb calcium from foods and helps your bones release calcium.   Certain kinds of cancer, such as lung cancer, breast cancer, or myeloma.    Less common causes of hypercalcemia include:   Getting too much calcium or vitamin D from your diet.   Kidney failure.   Hyperthyroidism.   Being on bed rest for a long time.   Certain medicines.   Infections.   Sarcoidosis.    What increases the risk?  This condition is more likely to develop in:   Women.   People who are 60 years or older.   People who have a family history of hypercalcemia.    What are the signs or symptoms?  Mild hypercalcemia that starts slowly may not cause symptoms. Severe, sudden hypercalcemia is more likely to cause symptoms, such as:   Loss of appetite.   Increased thirst and frequent urination.   Fatigue.   Nausea and vomiting.   Headache.   Abdominal pain.   Muscle pain, twitching, or weakness.   Constipation.   Blood in the urine.   Pain in the side of the back (flank pain).   Anxiety, confusion, or depression.   Irregular heartbeat (arrhythmia).   Loss of consciousness.    How is this diagnosed?  This condition may be diagnosed based on:   Your  symptoms.   Blood tests.   Urine tests.   X-rays.   Ultrasound.   MRI.   CT scan.    How is this treated?  Treatment for hypercalcemia depends on the cause. Treatment may include:   Receiving fluids through an IV tube.   Medicines that keep calcium levels steady after receiving fluids (loop diuretics).   Medicines that keep calcium in your bones (bisphosphonates).   Medicines that lower the calcium level in your blood.   Surgery to remove overactive parathyroid glands.    Follow these instructions at home:   Take over-the-counter and prescription medicines only as told by your health care provider.   Follow instructions from your health care provider about eating or drinking restrictions.   Drink enough fluid to keep your urine clear or pale yellow.   Stay active. Weight-bearing exercise helps to keep calcium in your bones. Follow instructions from your health care provider about what type and level of exercise is safe for you.   Keep all follow-up visits as told by your health care provider. This is important.  Contact a health care provider if:   You have a fever.   You have flank or abdominal pain that is getting worse.  Get help right away if:   You have severe abdominal or flank pain.   You have chest pain.     You have trouble breathing.   You become very confused and sleepy.   You lose consciousness.  This information is not intended to replace advice given to you by your health care provider. Make sure you discuss any questions you have with your health care provider.  Document Released: 11/30/2004 Document Revised: 02/22/2016 Document Reviewed: 02/01/2015  Elsevier Interactive Patient Education  2018 [employer]

## 2018-07-24 LAB — PTH, INTACT AND CALCIUM
CALCIUM: 11.4 mg/dL — AB (ref 8.6–10.3)
PTH: 51 pg/mL (ref 14–64)

## 2018-07-24 LAB — RUBEOLA ANTIBODY IGG: RUBEOLA IGG: 257 [AU]/ml

## 2018-08-06 ENCOUNTER — Other Ambulatory Visit: Payer: Self-pay | Admitting: Family Medicine

## 2018-08-10 ENCOUNTER — Other Ambulatory Visit: Payer: Self-pay | Admitting: Family Medicine

## 2018-08-18 ENCOUNTER — Other Ambulatory Visit: Payer: Self-pay | Admitting: Family Medicine

## 2018-08-25 ENCOUNTER — Encounter: Payer: Self-pay | Admitting: Internal Medicine

## 2018-09-22 ENCOUNTER — Other Ambulatory Visit: Payer: Self-pay | Admitting: Family Medicine

## 2018-09-30 HISTORY — PX: COLONOSCOPY: SHX174

## 2018-10-05 ENCOUNTER — Other Ambulatory Visit: Payer: Self-pay | Admitting: Family Medicine

## 2018-10-12 ENCOUNTER — Encounter: Payer: BLUE CROSS/BLUE SHIELD | Admitting: Internal Medicine

## 2018-10-22 ENCOUNTER — Telehealth: Payer: Self-pay | Admitting: *Deleted

## 2018-10-22 ENCOUNTER — Other Ambulatory Visit: Payer: Self-pay | Admitting: Family Medicine

## 2018-10-22 NOTE — Telephone Encounter (Signed)
Dr Charlett Blake -- Pt has lab appt on 10/26/18. Orders include PTH w/Calcium and comprehensive metabolic panel. The metabolic panel includes a calcium level so we would be duplicating the calcium. Can you place order for PTH only as calcium will be in the cmet and we can cancel the PTH w/Calicum if ok with you?

## 2018-10-22 NOTE — Telephone Encounter (Signed)
ordered

## 2018-10-23 NOTE — Telephone Encounter (Signed)
Thank you. PTH w/calcium has been cancelled.

## 2018-10-26 ENCOUNTER — Other Ambulatory Visit (INDEPENDENT_AMBULATORY_CARE_PROVIDER_SITE_OTHER): Payer: BLUE CROSS/BLUE SHIELD

## 2018-10-26 DIAGNOSIS — E559 Vitamin D deficiency, unspecified: Secondary | ICD-10-CM

## 2018-10-26 DIAGNOSIS — E785 Hyperlipidemia, unspecified: Secondary | ICD-10-CM | POA: Diagnosis not present

## 2018-10-26 DIAGNOSIS — M109 Gout, unspecified: Secondary | ICD-10-CM | POA: Diagnosis not present

## 2018-10-26 DIAGNOSIS — I10 Essential (primary) hypertension: Secondary | ICD-10-CM | POA: Diagnosis not present

## 2018-10-26 DIAGNOSIS — E119 Type 2 diabetes mellitus without complications: Secondary | ICD-10-CM | POA: Diagnosis not present

## 2018-10-26 LAB — LIPID PANEL
Cholesterol: 112 mg/dL (ref 0–200)
HDL: 35.9 mg/dL — ABNORMAL LOW (ref >39.00)
LDL CALC: 46 mg/dL (ref 0–99)
NonHDL: 75.83
Total CHOL/HDL Ratio: 3
Triglycerides: 151 mg/dL — ABNORMAL HIGH (ref 0.0–149.0)
VLDL: 30.2 mg/dL (ref 0.0–40.0)

## 2018-10-26 LAB — CBC WITH DIFFERENTIAL/PLATELET
Basophils Absolute: 0.1 10*3/uL (ref 0.0–0.1)
Basophils Relative: 0.7 % (ref 0.0–3.0)
Eosinophils Absolute: 0.1 10*3/uL (ref 0.0–0.7)
Eosinophils Relative: 1 % (ref 0.0–5.0)
HCT: 44.3 % (ref 39.0–52.0)
Hemoglobin: 15.1 g/dL (ref 13.0–17.0)
Lymphocytes Relative: 31.7 % (ref 12.0–46.0)
Lymphs Abs: 2.5 10*3/uL (ref 0.7–4.0)
MCHC: 34.1 g/dL (ref 30.0–36.0)
MCV: 88.3 fl (ref 78.0–100.0)
MONO ABS: 0.6 10*3/uL (ref 0.1–1.0)
Monocytes Relative: 7.5 % (ref 3.0–12.0)
Neutro Abs: 4.6 10*3/uL (ref 1.4–7.7)
Neutrophils Relative %: 59.1 % (ref 43.0–77.0)
Platelets: 134 10*3/uL — ABNORMAL LOW (ref 150.0–400.0)
RBC: 5.02 Mil/uL (ref 4.22–5.81)
RDW: 13.1 % (ref 11.5–15.5)
WBC: 7.8 10*3/uL (ref 4.0–10.5)

## 2018-10-26 LAB — COMPREHENSIVE METABOLIC PANEL
ALK PHOS: 89 U/L (ref 39–117)
ALT: 60 U/L — ABNORMAL HIGH (ref 0–53)
AST: 30 U/L (ref 0–37)
Albumin: 4.4 g/dL (ref 3.5–5.2)
BILIRUBIN TOTAL: 1 mg/dL (ref 0.2–1.2)
BUN: 16 mg/dL (ref 6–23)
CO2: 26 meq/L (ref 19–32)
Calcium: 10.8 mg/dL — ABNORMAL HIGH (ref 8.4–10.5)
Chloride: 99 mEq/L (ref 96–112)
Creatinine, Ser: 1.31 mg/dL (ref 0.40–1.50)
GFR: 57.12 mL/min — AB (ref >60.00)
Glucose, Bld: 157 mg/dL — ABNORMAL HIGH (ref 70–99)
Potassium: 4.1 mEq/L (ref 3.5–5.1)
Sodium: 135 mEq/L (ref 135–145)
Total Protein: 6.6 g/dL (ref 6.0–8.3)

## 2018-10-26 LAB — TSH: TSH: 1.98 u[IU]/mL (ref 0.35–4.50)

## 2018-10-26 LAB — VITAMIN D 25 HYDROXY (VIT D DEFICIENCY, FRACTURES): VITD: 42.82 ng/mL (ref 30.00–100.00)

## 2018-10-26 LAB — HEMOGLOBIN A1C: HEMOGLOBIN A1C: 5.9 % (ref 4.6–6.5)

## 2018-10-26 LAB — URIC ACID: URIC ACID, SERUM: 6.3 mg/dL (ref 4.0–7.8)

## 2018-10-27 DIAGNOSIS — Z85820 Personal history of malignant melanoma of skin: Secondary | ICD-10-CM | POA: Diagnosis not present

## 2018-10-27 DIAGNOSIS — L821 Other seborrheic keratosis: Secondary | ICD-10-CM | POA: Diagnosis not present

## 2018-10-27 DIAGNOSIS — D485 Neoplasm of uncertain behavior of skin: Secondary | ICD-10-CM | POA: Diagnosis not present

## 2018-10-27 DIAGNOSIS — D225 Melanocytic nevi of trunk: Secondary | ICD-10-CM | POA: Diagnosis not present

## 2018-10-27 DIAGNOSIS — L814 Other melanin hyperpigmentation: Secondary | ICD-10-CM | POA: Diagnosis not present

## 2018-10-27 LAB — PARATHYROID HORMONE, INTACT (NO CA): PTH: 49 pg/mL (ref 14–64)

## 2018-10-30 ENCOUNTER — Ambulatory Visit: Payer: BLUE CROSS/BLUE SHIELD | Admitting: Family Medicine

## 2018-10-30 ENCOUNTER — Encounter: Payer: Self-pay | Admitting: Family Medicine

## 2018-10-30 VITALS — BP 134/70 | HR 60 | Temp 97.9°F | Resp 18 | Wt 227.6 lb

## 2018-10-30 DIAGNOSIS — N289 Disorder of kidney and ureter, unspecified: Secondary | ICD-10-CM

## 2018-10-30 DIAGNOSIS — D696 Thrombocytopenia, unspecified: Secondary | ICD-10-CM

## 2018-10-30 DIAGNOSIS — K635 Polyp of colon: Secondary | ICD-10-CM

## 2018-10-30 DIAGNOSIS — Z720 Tobacco use: Secondary | ICD-10-CM

## 2018-10-30 DIAGNOSIS — R7989 Other specified abnormal findings of blood chemistry: Secondary | ICD-10-CM

## 2018-10-30 DIAGNOSIS — I10 Essential (primary) hypertension: Secondary | ICD-10-CM | POA: Diagnosis not present

## 2018-10-30 DIAGNOSIS — E559 Vitamin D deficiency, unspecified: Secondary | ICD-10-CM

## 2018-10-30 DIAGNOSIS — F17200 Nicotine dependence, unspecified, uncomplicated: Secondary | ICD-10-CM

## 2018-10-30 DIAGNOSIS — E785 Hyperlipidemia, unspecified: Secondary | ICD-10-CM | POA: Diagnosis not present

## 2018-10-30 DIAGNOSIS — M109 Gout, unspecified: Secondary | ICD-10-CM

## 2018-10-30 DIAGNOSIS — E119 Type 2 diabetes mellitus without complications: Secondary | ICD-10-CM

## 2018-10-30 MED ORDER — AMLODIPINE BESYLATE 2.5 MG PO TABS: ORAL_TABLET | ORAL | 1 refills | Status: DC

## 2018-10-30 NOTE — Assessment & Plan Note (Signed)
Rel of Rec colonoscopy requested and he will proceed with repeat colonoscopy soon

## 2018-10-30 NOTE — Assessment & Plan Note (Signed)
Hydrate and monitor 

## 2018-10-30 NOTE — Assessment & Plan Note (Signed)
hgba1c acceptable, minimize simple carbs. Increase exercise as tolerated. Continue current meds 

## 2018-10-30 NOTE — Assessment & Plan Note (Signed)
Stable and monitor

## 2018-10-30 NOTE — Assessment & Plan Note (Signed)
Tolerating statin, encouraged heart healthy diet, avoid trans fats, minimize simple carbs and saturated fats. Increase exercise as tolerated 

## 2018-10-30 NOTE — Assessment & Plan Note (Signed)
No recent flare.  

## 2018-10-30 NOTE — Assessment & Plan Note (Signed)
1 to 1.5 pack of smokeless tobacco is encouraged to quit is considering the tobacco gum

## 2018-10-30 NOTE — Assessment & Plan Note (Signed)
Supplement and monitor 

## 2018-10-31 DIAGNOSIS — Z720 Tobacco use: Secondary | ICD-10-CM | POA: Insufficient documentation

## 2018-10-31 NOTE — Progress Notes (Signed)
Subjective:    Patient ID: Steve Beasley, male    DOB: Feb 07, 1966, 53 y.o.   MRN: [id number]  No chief complaint on file.   HPI Patient is in today for follow up. He feels well today. Unfortunately he has been using smokeless tobacco still. No recent febrile illness or hospitalizations. No polyuria or polydipsia. Denies CP/palp/SOB/HA/congestion/fevers/GI or GU c/o. Taking meds as prescribed  Past Medical History:  Diagnosis Date  . Allergy-induced asthma   . Anxiety state, unspecified 09/19/2013  . Atypical chest pain 11/13/2014  . Basal cell carcinoma of scalp 09/09/2016  . Diabetes mellitus without complication ([place])   . Fatty liver disease, nonalcoholic 68/08/7516  . Gout 09/18/2014  . Hypertension   . Nicotine use disorder 06/06/2013   smokeless  . Obesity, unspecified 06/06/2013  . Osteopenia   . Other and unspecified hyperlipidemia 01/19/2014  . Preventative health care 09/19/2013  . Right heart enlargement 09/18/2014  . Testicular cancer ([place])   . Tinnitus 03/24/2017  . Valvular heart disease 09/18/2014    Past Surgical History:  Procedure Laterality Date  . BASAL CELL CARCINOMA EXCISION     Within 6 months  . SKIN CANCER EXCISION     Within 6 months  . testicular cancer surgery      Family History  Problem Relation Age of Onset  . Diabetes Mother   . Breast cancer Mother   . Bone cancer Mother   . Cancer Mother        breast with bone mets  . Depression Mother   . Hypertension Father   . Prostate cancer Father   . Hyperlipidemia Father   . Cancer Father        prostate  . Heart attack Neg Hx   . Sudden death Neg Hx   . Stroke Paternal Grandfather   . Prostate cancer Maternal Grandfather   . Hypertension Brother   . Gallstones Brother   . Anxiety disorder Daughter   . ADD / ADHD Daughter   . Bulemia Daughter   . Depression Son   . Anxiety disorder Maternal Aunt        social anxiety, anger  . Stroke Maternal Uncle 29    Social History    Socioeconomic History  . Marital status: Married    Spouse name: Not on file  . Number of children: 2  . Years of education: Not on file  . Highest education level: Not on file  Occupational History  . Occupation: Special educational needs teacher Needs  . Financial resource strain: Not on file  . Food insecurity:    Worry: Not on file    Inability: Not on file  . Transportation needs:    Medical: Not on file    Non-medical: Not on file  Tobacco Use  . Smoking status: Former Smoker    Types: Cigarettes  . Smokeless tobacco: Former Systems developer    Quit date: 02/28/2014  . Tobacco comment: 30 years ago socially  Substance and Sexual Activity  . Alcohol use: Yes    Alcohol/week: 0.0 standard drinks  . Drug use: No  . Sexual activity: Yes    Comment: lives with wife, travels with work, no dietary restrictions , exercise regular  Lifestyle  . Physical activity:    Days per week: Not on file    Minutes per session: Not on file  . Stress: Not on file  Relationships  . Social connections:    Talks on phone: Not on file  Gets together: Not on file    Attends religious service: Not on file    Active member of club or organization: Not on file    Attends meetings of clubs or organizations: Not on file    Relationship status: Not on file  . Intimate partner violence:    Fear of current or ex partner: Not on file    Emotionally abused: Not on file    Physically abused: Not on file    Forced sexual activity: Not on file  Other Topics Concern  . Not on file  Social History Narrative  . Not on file    Outpatient Medications Prior to Visit  Medication Sig Dispense Refill  . albuterol (PROVENTIL HFA;VENTOLIN HFA) 108 (90 Base) MCG/ACT inhaler Inhale 2 puffs into the lungs every 6 (six) hours as needed for wheezing or shortness of breath. 1 Inhaler 2  . allopurinol (ZYLOPRIM) 100 MG tablet TAKE 1 TABLET(100 MG) BY MOUTH TWICE DAILY 180 tablet 1  . allopurinol (ZYLOPRIM) 100 MG tablet TAKE 1 TABLET(100  MG) BY MOUTH TWICE DAILY 180 tablet 0  . atorvastatin (LIPITOR) 10 MG tablet TAKE 1 TABLET BY MOUTH EVERY NIGHT AT BEDTIME 90 tablet 0  . atorvastatin (LIPITOR) 10 MG tablet TAKE 1 TABLET(10 MG) BY MOUTH AT BEDTIME 90 tablet 1  . Cholecalciferol (VITAMIN D3) 5000 UNITS CAPS Take 1 capsule by mouth daily.    Marland Kitchen glimepiride (AMARYL) 2 MG tablet Take 1 tablet (2 mg total) by mouth daily. 30 tablet 5  . glimepiride (AMARYL) 2 MG tablet TAKE 1 TABLET(2 MG) BY MOUTH TWICE DAILY WITH A MEAL 180 tablet 0  . glucose blood (ONE TOUCH ULTRA TEST) test strip USE AS DIRECTED TO TEST BLOOD SUGAR EVERY DAY AND AS NEEDED 100 each 0  . glucose blood test strip CHECK BLOOD SUGAR DAILY AND PRN. 100 each 4  . losartan (COZAAR) 100 MG tablet TAKE 1 TABLET(100 MG) BY MOUTH DAILY 90 tablet 0  . metFORMIN (GLUCOPHAGE) 1000 MG tablet TAKE 1 TABLET(1000 MG) BY MOUTH TWICE DAILY WITH A MEAL 180 tablet 0  . triamterene-hydrochlorothiazide (MAXZIDE-25) 37.5-25 MG tablet TAKE 1 TABLET BY MOUTH DAILY 90 tablet 0  . amLODipine (NORVASC) 2.5 MG tablet TAKE 1 TABLET(2.5 MG) BY MOUTH DAILY 90 tablet 1   No facility-administered medications prior to visit.     No Known Allergies  Review of Systems  Constitutional: Negative for fever and malaise/fatigue.  HENT: Negative for congestion.   Eyes: Negative for blurred vision.  Respiratory: Negative for shortness of breath.   Cardiovascular: Negative for chest pain, palpitations and leg swelling.  Gastrointestinal: Negative for abdominal pain, blood in stool and nausea.  Genitourinary: Negative for dysuria and frequency.  Musculoskeletal: Negative for falls.  Skin: Negative for rash.  Neurological: Negative for dizziness, loss of consciousness and headaches.  Endo/Heme/Allergies: Negative for environmental allergies.  Psychiatric/Behavioral: Negative for depression. The patient is not nervous/anxious.        Objective:    Physical Exam Vitals signs and nursing note  reviewed.  Constitutional:      General: He is not in acute distress.    Appearance: He is well-developed.  HENT:     Head: Normocephalic and atraumatic.     Nose: Nose normal.  Eyes:     General:        Right eye: No discharge.        Left eye: No discharge.  Neck:     Musculoskeletal: Normal range of motion  and neck supple.  Cardiovascular:     Rate and Rhythm: Normal rate and regular rhythm.     Heart sounds: No murmur.  Pulmonary:     Effort: Pulmonary effort is normal.     Breath sounds: Normal breath sounds.  Abdominal:     General: Bowel sounds are normal.     Palpations: Abdomen is soft.     Tenderness: There is no abdominal tenderness.  Skin:    General: Skin is warm and dry.  Neurological:     Mental Status: He is alert and oriented to person, place, and time.     BP 134/70 (BP Location: Left Arm, Patient Position: Sitting, Cuff Size: Large)   Pulse 60   Temp 97.9 F (36.6 C) (Oral)   Resp 18   Wt 227 lb 9.6 oz (103.2 kg)   SpO2 99%   BMI 31.74 kg/m  Wt Readings from Last 3 Encounters:  10/30/18 227 lb 9.6 oz (103.2 kg)  07/23/18 218 lb 6.4 oz (99.1 kg)  05/07/18 222 lb (100.7 kg)     Lab Results  Component Value Date   WBC 7.8 10/26/2018   HGB 15.1 10/26/2018   HCT 44.3 10/26/2018   PLT 134.0 (L) 10/26/2018   GLUCOSE 157 (H) 10/26/2018   CHOL 112 10/26/2018   TRIG 151.0 (H) 10/26/2018   HDL 35.90 (L) 10/26/2018   LDLDIRECT 71.0 01/20/2018   LDLCALC 46 10/26/2018   ALT 60 (H) 10/26/2018   AST 30 10/26/2018   NA 135 10/26/2018   K 4.1 10/26/2018   CL 99 10/26/2018   CREATININE 1.31 10/26/2018   BUN 16 10/26/2018   CO2 26 10/26/2018   TSH 1.98 10/26/2018   PSA 2.90 03/27/2016   HGBA1C 5.9 10/26/2018   MICROALBUR 1.3 08/21/2015    Lab Results  Component Value Date   TSH 1.98 10/26/2018   Lab Results  Component Value Date   WBC 7.8 10/26/2018   HGB 15.1 10/26/2018   HCT 44.3 10/26/2018   MCV 88.3 10/26/2018   PLT 134.0 (L)  10/26/2018   Lab Results  Component Value Date   NA 135 10/26/2018   K 4.1 10/26/2018   CO2 26 10/26/2018   GLUCOSE 157 (H) 10/26/2018   BUN 16 10/26/2018   CREATININE 1.31 10/26/2018   BILITOT 1.0 10/26/2018   ALKPHOS 89 10/26/2018   AST 30 10/26/2018   ALT 60 (H) 10/26/2018   PROT 6.6 10/26/2018   ALBUMIN 4.4 10/26/2018   CALCIUM 10.8 (H) 10/26/2018   GFR 57.12 (L) 10/26/2018   Lab Results  Component Value Date   CHOL 112 10/26/2018   Lab Results  Component Value Date   HDL 35.90 (L) 10/26/2018   Lab Results  Component Value Date   LDLCALC 46 10/26/2018   Lab Results  Component Value Date   TRIG 151.0 (H) 10/26/2018   Lab Results  Component Value Date   CHOLHDL 3 10/26/2018   Lab Results  Component Value Date   HGBA1C 5.9 10/26/2018       Assessment & Plan:   Problem List Items Addressed This Visit    DM (diabetes mellitus), type 2 ([place])    hgba1c acceptable, minimize simple carbs. Increase exercise as tolerated. Continue current meds      Relevant Orders   Hemoglobin A1c   HTN (hypertension) - Primary    Well controlled, no changes to meds. Encouraged heart healthy diet such as the DASH diet and exercise as tolerated.  Relevant Medications   amLODipine (NORVASC) 2.5 MG tablet   Other Relevant Orders   Comprehensive metabolic panel   TSH   RESOLVED: Vitamin D deficiency    Supplement and monitor      Relevant Orders   VITAMIN D 25 Hydroxy (Vit-D Deficiency, Fractures)   Nicotine use disorder    1 to 1.5 pack of smokeless tobacco is encouraged to quit is considering the tobacco gum      Hyperlipidemia    Tolerating statin, encouraged heart healthy diet, avoid trans fats, minimize simple carbs and saturated fats. Increase exercise as tolerated      Relevant Medications   amLODipine (NORVASC) 2.5 MG tablet   Other Relevant Orders   Lipid panel   Gout    No recent flare      Relevant Orders   Uric acid   Colon polyp    Rel of  Rec colonoscopy requested and he will proceed with repeat colonoscopy soon      Renal insufficiency    Hydrate and monitor      Low vitamin D level    Supplement and monitor      Thrombocytopenia ([facility])    Stable and monitor      Relevant Orders   CBC with Differential/Platelet   Hypercalcemia   Smokeless tobacco use    Encouraged to quit again.          I am having Naim Murtha. Larene Beach maintain his Vitamin D3, albuterol, glucose blood, allopurinol, glimepiride, glucose blood, atorvastatin, glimepiride, metFORMIN, atorvastatin, allopurinol, losartan, triamterene-hydrochlorothiazide, and amLODipine.  Meds ordered this encounter  Medications  . amLODipine (NORVASC) 2.5 MG tablet    Sig: TAKE 1 TABLET(2.5 MG) BY MOUTH DAILY    Dispense:  90 tablet    Refill:  1     Penni Homans, MD

## 2018-10-31 NOTE — Assessment & Plan Note (Signed)
Encouraged to quit again.

## 2018-10-31 NOTE — Assessment & Plan Note (Signed)
Supplement and monitor 

## 2018-10-31 NOTE — Assessment & Plan Note (Signed)
>>  ASSESSMENT AND PLAN FOR HTN (HYPERTENSION) WRITTEN ON 10/31/2018  8:05 PM BY BLYTH, STACEY A, MD  Well controlled, no changes to meds. Encouraged heart healthy diet such as the DASH diet and exercise as tolerated.

## 2018-10-31 NOTE — Assessment & Plan Note (Signed)
Well controlled, no changes to meds. Encouraged heart healthy diet such as the DASH diet and exercise as tolerated.  °

## 2018-11-03 ENCOUNTER — Telehealth: Payer: Self-pay | Admitting: *Deleted

## 2018-11-03 NOTE — Telephone Encounter (Signed)
Received Dermatopathology Report results from [facility]; forwarded to provider/SLS 02/04

## 2018-11-08 ENCOUNTER — Other Ambulatory Visit: Payer: Self-pay | Admitting: Family Medicine

## 2018-11-12 ENCOUNTER — Telehealth: Payer: Self-pay | Admitting: *Deleted

## 2018-11-12 NOTE — Telephone Encounter (Signed)
Received Medical records from [facility]; forwarded to provider/SLS 02/13

## 2018-11-14 ENCOUNTER — Other Ambulatory Visit: Payer: Self-pay | Admitting: Family Medicine

## 2018-11-20 ENCOUNTER — Encounter: Payer: Self-pay | Admitting: Family Medicine

## 2019-01-01 ENCOUNTER — Other Ambulatory Visit: Payer: Self-pay | Admitting: Family Medicine

## 2019-01-22 ENCOUNTER — Telehealth: Payer: Self-pay | Admitting: *Deleted

## 2019-01-22 NOTE — Telephone Encounter (Signed)
Received Colonoscopy Imaging Results from [facility]; forwarded to provider/SLS 04/24

## 2019-02-14 ENCOUNTER — Other Ambulatory Visit: Payer: Self-pay | Admitting: Family Medicine

## 2019-04-08 ENCOUNTER — Other Ambulatory Visit: Payer: Self-pay | Admitting: Family Medicine

## 2019-04-25 ENCOUNTER — Other Ambulatory Visit: Payer: Self-pay | Admitting: Family Medicine

## 2019-04-29 ENCOUNTER — Other Ambulatory Visit (INDEPENDENT_AMBULATORY_CARE_PROVIDER_SITE_OTHER): Payer: BC Managed Care – PPO

## 2019-04-29 ENCOUNTER — Other Ambulatory Visit: Payer: Self-pay

## 2019-04-29 DIAGNOSIS — M109 Gout, unspecified: Secondary | ICD-10-CM | POA: Diagnosis not present

## 2019-04-29 DIAGNOSIS — E785 Hyperlipidemia, unspecified: Secondary | ICD-10-CM

## 2019-04-29 DIAGNOSIS — E119 Type 2 diabetes mellitus without complications: Secondary | ICD-10-CM

## 2019-04-29 DIAGNOSIS — D696 Thrombocytopenia, unspecified: Secondary | ICD-10-CM | POA: Diagnosis not present

## 2019-04-29 DIAGNOSIS — E559 Vitamin D deficiency, unspecified: Secondary | ICD-10-CM | POA: Diagnosis not present

## 2019-04-29 DIAGNOSIS — I10 Essential (primary) hypertension: Secondary | ICD-10-CM | POA: Diagnosis not present

## 2019-04-29 LAB — CBC WITH DIFFERENTIAL/PLATELET
Basophils Absolute: 0 10*3/uL (ref 0.0–0.1)
Basophils Relative: 0.4 % (ref 0.0–3.0)
Eosinophils Absolute: 0.1 10*3/uL (ref 0.0–0.7)
Eosinophils Relative: 1 % (ref 0.0–5.0)
HCT: 43.4 % (ref 39.0–52.0)
Hemoglobin: 14.7 g/dL (ref 13.0–17.0)
Lymphocytes Relative: 33.8 % (ref 12.0–46.0)
Lymphs Abs: 3.3 10*3/uL (ref 0.7–4.0)
MCHC: 33.9 g/dL (ref 30.0–36.0)
MCV: 88.6 fl (ref 78.0–100.0)
Monocytes Absolute: 0.7 10*3/uL (ref 0.1–1.0)
Monocytes Relative: 7.5 % (ref 3.0–12.0)
Neutro Abs: 5.5 10*3/uL (ref 1.4–7.7)
Neutrophils Relative %: 57.3 % (ref 43.0–77.0)
Platelets: 150 10*3/uL (ref 150.0–400.0)
RBC: 4.9 Mil/uL (ref 4.22–5.81)
RDW: 13.1 % (ref 11.5–15.5)
WBC: 9.6 10*3/uL (ref 4.0–10.5)

## 2019-04-29 LAB — COMPREHENSIVE METABOLIC PANEL
ALT: 57 U/L — ABNORMAL HIGH (ref 0–53)
AST: 33 U/L (ref 0–37)
Albumin: 4.6 g/dL (ref 3.5–5.2)
Alkaline Phosphatase: 83 U/L (ref 39–117)
BUN: 26 mg/dL — ABNORMAL HIGH (ref 6–23)
CO2: 29 mEq/L (ref 19–32)
Calcium: 10.8 mg/dL — ABNORMAL HIGH (ref 8.4–10.5)
Chloride: 99 mEq/L (ref 96–112)
Creatinine, Ser: 1.46 mg/dL (ref 0.40–1.50)
GFR: 50.3 mL/min — ABNORMAL LOW (ref >60.00)
Glucose, Bld: 141 mg/dL — ABNORMAL HIGH (ref 70–99)
Potassium: 3.9 mEq/L (ref 3.5–5.1)
Sodium: 136 mEq/L (ref 135–145)
Total Bilirubin: 1.6 mg/dL — ABNORMAL HIGH (ref 0.2–1.2)
Total Protein: 6.9 g/dL (ref 6.0–8.3)

## 2019-04-29 LAB — LIPID PANEL
Cholesterol: 104 mg/dL (ref 0–200)
HDL: 29.9 mg/dL — ABNORMAL LOW (ref >39.00)
LDL Cholesterol: 39 mg/dL (ref 0–99)
NonHDL: 73.7
Total CHOL/HDL Ratio: 3
Triglycerides: 172 mg/dL — ABNORMAL HIGH (ref 0.0–149.0)
VLDL: 34.4 mg/dL (ref 0.0–40.0)

## 2019-04-29 LAB — VITAMIN D 25 HYDROXY (VIT D DEFICIENCY, FRACTURES): VITD: 33.83 ng/mL (ref 30.00–100.00)

## 2019-04-29 LAB — URIC ACID: Uric Acid, Serum: 6.3 mg/dL (ref 4.0–7.8)

## 2019-04-29 LAB — TSH: TSH: 2.15 u[IU]/mL (ref 0.35–4.50)

## 2019-04-29 LAB — HEMOGLOBIN A1C: Hgb A1c MFr Bld: 6.4 % (ref 4.6–6.5)

## 2019-05-06 ENCOUNTER — Ambulatory Visit (INDEPENDENT_AMBULATORY_CARE_PROVIDER_SITE_OTHER): Payer: BC Managed Care – PPO | Admitting: Family Medicine

## 2019-05-06 ENCOUNTER — Telehealth: Payer: Self-pay | Admitting: Gastroenterology

## 2019-05-06 ENCOUNTER — Other Ambulatory Visit: Payer: Self-pay

## 2019-05-06 ENCOUNTER — Telehealth: Payer: Self-pay | Admitting: Family Medicine

## 2019-05-06 VITALS — BP 119/70 | Wt 216.0 lb

## 2019-05-06 DIAGNOSIS — N289 Disorder of kidney and ureter, unspecified: Secondary | ICD-10-CM

## 2019-05-06 DIAGNOSIS — I10 Essential (primary) hypertension: Secondary | ICD-10-CM | POA: Diagnosis not present

## 2019-05-06 DIAGNOSIS — R7989 Other specified abnormal findings of blood chemistry: Secondary | ICD-10-CM | POA: Diagnosis not present

## 2019-05-06 DIAGNOSIS — M109 Gout, unspecified: Secondary | ICD-10-CM

## 2019-05-06 DIAGNOSIS — Z1211 Encounter for screening for malignant neoplasm of colon: Secondary | ICD-10-CM

## 2019-05-06 DIAGNOSIS — E119 Type 2 diabetes mellitus without complications: Secondary | ICD-10-CM

## 2019-05-06 DIAGNOSIS — E785 Hyperlipidemia, unspecified: Secondary | ICD-10-CM

## 2019-05-06 DIAGNOSIS — Z0000 Encounter for general adult medical examination without abnormal findings: Secondary | ICD-10-CM | POA: Diagnosis not present

## 2019-05-06 DIAGNOSIS — M8580 Other specified disorders of bone density and structure, unspecified site: Secondary | ICD-10-CM

## 2019-05-06 NOTE — Assessment & Plan Note (Signed)
Encouraged heart healthy diet, increase exercise, avoid trans fats, consider a krill oil cap daily 

## 2019-05-06 NOTE — Assessment & Plan Note (Signed)
Supplement and monitor 

## 2019-05-06 NOTE — Assessment & Plan Note (Signed)
Well controlled, no changes to meds. Encouraged heart healthy diet such as the DASH diet and exercise as tolerated.  °

## 2019-05-06 NOTE — Telephone Encounter (Signed)
LVM to return call to schedule "Return in about 6 months (around 11/06/2019), or 3 mn lab appt, 6 mn f/u" appointments.

## 2019-05-06 NOTE — Assessment & Plan Note (Signed)
>>  ASSESSMENT AND PLAN FOR HTN (HYPERTENSION) WRITTEN ON 05/06/2019  7:49 AM BY BLYTH, STACEY A, MD  Well controlled, no changes to meds. Encouraged heart healthy diet such as the DASH diet and exercise as tolerated.

## 2019-05-06 NOTE — Telephone Encounter (Signed)
DOD 05/06/19  Dr. Rush Landmark, there is a referral for pt to have a colonoscopu.  Previous 2011 colonoscopy report is in [place] and will be sent to you for review.  Please advise scheduling.

## 2019-05-06 NOTE — Assessment & Plan Note (Signed)
Hydrate and monitor 

## 2019-05-09 ENCOUNTER — Other Ambulatory Visit: Payer: Self-pay | Admitting: Family Medicine

## 2019-05-09 NOTE — Assessment & Plan Note (Signed)
hgba1c acceptable, minimize simple carbs. Increase exercise as tolerated. Continue current meds 

## 2019-05-09 NOTE — Progress Notes (Signed)
Virtual Visit via Video Note  I connected with Steve Beasley on 05/07/19 at  8:00 AM EDT by a video enabled telemedicine application and verified that I am speaking with the correct person using two identifiers.  Location: Patient: home Provider: office   I discussed the limitations of evaluation and management by telemedicine and the availability of in person appointments. The patient expressed understanding and agreed to proceed. Steve Beasley, CMA was able to get patient set up on visit, video   Subjective:    Patient ID: Steve Beasley, male    DOB: [birth year], 53 y.o.   MRN: [id number]  No chief complaint on file.   HPI Patient is in today for annual preventative exam and follow up on chronic medical concerns including hypertension, diabetes and obesity. He is doing well. No recent febrile illness or hospitalizations. He has gotten a big promotion at work and while it has been good it has been an added stress on top of the pandemic. He has not been exercising as regularly or eating as clean as he was before this but is trying to get back on track. He has had some intermittent loose and hard stool but no bloody or tarry stool. No polyuria or polydipsia. Denies CP/palp/SOB/HA/congestion/fevers/GI or GU c/o. Taking meds as prescribed  Past Medical History:  Diagnosis Date  . Allergy-induced asthma   . Anxiety state, unspecified 09/19/2013  . Atypical chest pain 11/13/2014  . Basal cell carcinoma of scalp 09/09/2016  . Diabetes mellitus without complication ([place])   . Fatty liver disease, nonalcoholic 03/29/1600  . Gout 09/18/2014  . Hypertension   . Nicotine use disorder 06/06/2013   smokeless  . Obesity, unspecified 06/06/2013  . Osteopenia   . Other and unspecified hyperlipidemia 01/19/2014  . Preventative health care 09/19/2013  . Right heart enlargement 09/18/2014  . Testicular cancer ([place])   . Tinnitus 03/24/2017  . Valvular heart disease 09/18/2014    Past Surgical  History:  Procedure Laterality Date  . BASAL CELL CARCINOMA EXCISION     Within 6 months  . SKIN CANCER EXCISION     Within 6 months  . testicular cancer surgery      Family History  Problem Relation Age of Onset  . Diabetes Mother   . Breast cancer Mother   . Bone cancer Mother   . Cancer Mother        breast with bone mets  . Depression Mother   . Hypertension Father   . Prostate cancer Father   . Hyperlipidemia Father   . Cancer Father        prostate  . Heart attack Neg Hx   . Sudden death Neg Hx   . Stroke Paternal Grandfather   . Prostate cancer Maternal Grandfather   . Hypertension Brother   . Gallstones Brother   . Anxiety disorder Daughter   . ADD / ADHD Daughter   . Bulemia Daughter   . Depression Son   . Anxiety disorder Maternal Aunt        social anxiety, anger  . Stroke Maternal Uncle 15    Social History   Socioeconomic History  . Marital status: Married    Spouse name: Not on file  . Number of children: 2  . Years of education: Not on file  . Highest education level: Not on file  Occupational History  . Occupation: Special educational needs teacher Needs  . Financial resource strain: Not on file  . Food insecurity  Worry: Not on file    Inability: Not on file  . Transportation needs    Medical: Not on file    Non-medical: Not on file  Tobacco Use  . Smoking status: Former Smoker    Types: Cigarettes  . Smokeless tobacco: Former Systems developer    Quit date: 02/28/2014  . Tobacco comment: 30 years ago socially  Substance and Sexual Activity  . Alcohol use: Yes    Alcohol/week: 0.0 standard drinks  . Drug use: No  . Sexual activity: Yes    Comment: lives with wife, travels with work, no dietary restrictions , exercise regular  Lifestyle  . Physical activity    Days per week: Not on file    Minutes per session: Not on file  . Stress: Not on file  Relationships  . Social Herbalist on phone: Not on file    Gets together: Not on file    Attends  religious service: Not on file    Active member of club or organization: Not on file    Attends meetings of clubs or organizations: Not on file    Relationship status: Not on file  . Intimate partner violence    Fear of current or ex partner: Not on file    Emotionally abused: Not on file    Physically abused: Not on file    Forced sexual activity: Not on file  Other Topics Concern  . Not on file  Social History Narrative  . Not on file    Outpatient Medications Prior to Visit  Medication Sig Dispense Refill  . albuterol (PROVENTIL HFA;VENTOLIN HFA) 108 (90 Base) MCG/ACT inhaler Inhale 2 puffs into the lungs every 6 (six) hours as needed for wheezing or shortness of breath. 1 Inhaler 2  . allopurinol (ZYLOPRIM) 100 MG tablet TAKE 1 TABLET(100 MG) BY MOUTH TWICE DAILY 180 tablet 1  . amLODipine (NORVASC) 2.5 MG tablet TAKE 1 TABLET(2.5 MG) BY MOUTH DAILY 90 tablet 1  . atorvastatin (LIPITOR) 10 MG tablet TAKE 1 TABLET(10 MG) BY MOUTH AT BEDTIME 90 tablet 1  . Cholecalciferol (VITAMIN D3) 5000 UNITS CAPS Take 1 capsule by mouth daily.    Marland Kitchen glimepiride (AMARYL) 2 MG tablet TAKE 1 TABLET(2 MG) BY MOUTH TWICE DAILY WITH A MEAL 180 tablet 0  . glucose blood (ONE TOUCH ULTRA TEST) test strip USE AS DIRECTED TO TEST BLOOD SUGAR EVERY DAY AND AS NEEDED 100 each 0  . losartan (COZAAR) 100 MG tablet TAKE 1 TABLET(100 MG) BY MOUTH DAILY 90 tablet 0  . metFORMIN (GLUCOPHAGE) 1000 MG tablet TAKE 1 TABLET(1000 MG) BY MOUTH TWICE DAILY WITH A MEAL 180 tablet 0  . triamterene-hydrochlorothiazide (MAXZIDE-25) 37.5-25 MG tablet TAKE 1 TABLET BY MOUTH DAILY 90 tablet 0  . allopurinol (ZYLOPRIM) 100 MG tablet TAKE 1 TABLET(100 MG) BY MOUTH TWICE DAILY 180 tablet 1  . allopurinol (ZYLOPRIM) 100 MG tablet TAKE 1 TABLET(100 MG) BY MOUTH TWICE DAILY 180 tablet 0  . atorvastatin (LIPITOR) 10 MG tablet TAKE 1 TABLET BY MOUTH EVERY NIGHT AT BEDTIME 90 tablet 0  . glimepiride (AMARYL) 2 MG tablet Take 1 tablet (2  mg total) by mouth daily. 30 tablet 5  . glucose blood test strip CHECK BLOOD SUGAR DAILY AND PRN. 100 each 4   No facility-administered medications prior to visit.     No Known Allergies  Review of Systems  Constitutional: Negative for chills, fever and malaise/fatigue.  HENT: Negative for congestion and hearing loss.  Eyes: Negative for discharge.  Respiratory: Negative for cough, sputum production and shortness of breath.   Cardiovascular: Negative for chest pain, palpitations and leg swelling.  Gastrointestinal: Negative for abdominal pain, blood in stool, constipation, diarrhea, heartburn, nausea and vomiting.  Genitourinary: Negative for dysuria, frequency, hematuria and urgency.  Musculoskeletal: Negative for back pain, falls and myalgias.  Skin: Negative for rash.  Neurological: Negative for dizziness, sensory change, loss of consciousness, weakness and headaches.  Endo/Heme/Allergies: Negative for environmental allergies. Does not bruise/bleed easily.  Psychiatric/Behavioral: Negative for depression and suicidal ideas. The patient is not nervous/anxious and does not have insomnia.        Objective:    Physical Exam  There were no vitals taken for this visit. Wt Readings from Last 3 Encounters:  10/30/18 227 lb 9.6 oz (103.2 kg)  07/23/18 218 lb 6.4 oz (99.1 kg)  05/07/18 222 lb (100.7 kg)    Diabetic Foot Exam - Simple   No data filed     Lab Results  Component Value Date   WBC 9.6 04/29/2019   HGB 14.7 04/29/2019   HCT 43.4 04/29/2019   PLT 150.0 04/29/2019   GLUCOSE 141 (H) 04/29/2019   CHOL 104 04/29/2019   TRIG 172.0 (H) 04/29/2019   HDL 29.90 (L) 04/29/2019   LDLDIRECT 71.0 01/20/2018   LDLCALC 39 04/29/2019   ALT 57 (H) 04/29/2019   AST 33 04/29/2019   NA 136 04/29/2019   K 3.9 04/29/2019   CL 99 04/29/2019   CREATININE 1.46 04/29/2019   BUN 26 (H) 04/29/2019   CO2 29 04/29/2019   TSH 2.15 04/29/2019   PSA 2.90 03/27/2016   HGBA1C 6.4  04/29/2019   MICROALBUR 1.3 08/21/2015    Lab Results  Component Value Date   TSH 2.15 04/29/2019   Lab Results  Component Value Date   WBC 9.6 04/29/2019   HGB 14.7 04/29/2019   HCT 43.4 04/29/2019   MCV 88.6 04/29/2019   PLT 150.0 04/29/2019   Lab Results  Component Value Date   NA 136 04/29/2019   K 3.9 04/29/2019   CO2 29 04/29/2019   GLUCOSE 141 (H) 04/29/2019   BUN 26 (H) 04/29/2019   CREATININE 1.46 04/29/2019   BILITOT 1.6 (H) 04/29/2019   ALKPHOS 83 04/29/2019   AST 33 04/29/2019   ALT 57 (H) 04/29/2019   PROT 6.9 04/29/2019   ALBUMIN 4.6 04/29/2019   CALCIUM 10.8 (H) 04/29/2019   GFR 50.30 (L) 04/29/2019   Lab Results  Component Value Date   CHOL 104 04/29/2019   Lab Results  Component Value Date   HDL 29.90 (L) 04/29/2019   Lab Results  Component Value Date   LDLCALC 39 04/29/2019   Lab Results  Component Value Date   TRIG 172.0 (H) 04/29/2019   Lab Results  Component Value Date   CHOLHDL 3 04/29/2019   Lab Results  Component Value Date   HGBA1C 6.4 04/29/2019       Assessment & Plan:   Problem List Items Addressed This Visit    DM (diabetes mellitus), type 2 ([place])    hgba1c acceptable, minimize simple carbs. Increase exercise as tolerated. Continue current meds      Relevant Orders   Hemoglobin A1c   HTN (hypertension)    Well controlled, no changes to meds. Encouraged heart healthy diet such as the DASH diet and exercise as tolerated.       Relevant Orders   CBC   Comprehensive metabolic panel  TSH   Osteopenia    Encouraged to get adequate exercise, calcium and vitamin d intake      Relevant Orders   DG Bone Density   Preventative health care    Patient encouraged to maintain heart healthy diet, regular exercise, adequate sleep. Consider daily probiotics. Take medications as prescribed. Labs ordered and reviewed. Referred to gastroenterology for colonoscopy      Hyperlipidemia    Encouraged heart healthy diet,  increase exercise, avoid trans fats, consider a krill oil cap daily      Relevant Orders   Lipid panel   Gout    Tolerating Allopurinol no recent flares.       Renal insufficiency    Hydrate and monitor      Low vitamin D level    Supplement and monitor       Other Visit Diagnoses    Encounter for screening and preventative care    -  Primary   Relevant Orders   Hemoglobin A1c   CBC   Comprehensive metabolic panel   Lipid panel   TSH   Colon cancer screening       Relevant Orders   Ambulatory referral to Gastroenterology      I am having Luciano Cutter. Larene Beach maintain his Vitamin D3, albuterol, glucose blood, atorvastatin, allopurinol, metFORMIN, glimepiride, losartan, triamterene-hydrochlorothiazide, and amLODipine.  No orders of the defined types were placed in this encounter. I discussed the assessment and treatment plan with the patient. The patient was provided an opportunity to ask questions and all were answered. The patient agreed with the plan and demonstrated an understanding of the instructions.   The patient was advised to call back or seek an in-person evaluation if the symptoms worsen or if the condition fails to improve as anticipated.  I provided 25 minutes of non-face-to-face time during this encounter.   Penni Homans, MD

## 2019-05-09 NOTE — Assessment & Plan Note (Addendum)
Patient encouraged to maintain heart healthy diet, regular exercise, adequate sleep. Consider daily probiotics. Take medications as prescribed. Labs ordered and reviewed. Referred to gastroenterology for colonoscopy

## 2019-05-09 NOTE — Assessment & Plan Note (Signed)
Encouraged to get adequate exercise, calcium and vitamin d intake 

## 2019-05-09 NOTE — Assessment & Plan Note (Signed)
Tolerating Allopurinol no recent flares.

## 2019-05-11 ENCOUNTER — Encounter: Payer: Self-pay | Admitting: Gastroenterology

## 2019-05-14 ENCOUNTER — Other Ambulatory Visit: Payer: Self-pay | Admitting: Family Medicine

## 2019-06-10 ENCOUNTER — Ambulatory Visit (AMBULATORY_SURGERY_CENTER): Payer: Self-pay | Admitting: *Deleted

## 2019-06-10 ENCOUNTER — Other Ambulatory Visit: Payer: Self-pay

## 2019-06-10 VITALS — Temp 97.5°F | Ht 71.0 in | Wt 222.0 lb

## 2019-06-10 DIAGNOSIS — Z86010 Personal history of colonic polyps: Secondary | ICD-10-CM

## 2019-06-10 MED ORDER — PEG 3350-KCL-NA BICARB-NACL 420 G PO SOLR: 4000.0000 mL | Freq: Once | ORAL | 0 refills | Status: AC

## 2019-06-10 NOTE — Progress Notes (Signed)
No egg or soy allergy known to patient  No issues with past sedation with any surgeries  or procedures, no intubation problems  No diet pills per patient No home 02 use per patient  No blood thinners per patient  Pt denies issues with constipation  No A fib or A flutter  EMMI video sent to pt's e mail   Pt instructed to not use the chew the day of the procedure 10/8

## 2019-06-13 ENCOUNTER — Other Ambulatory Visit: Payer: Self-pay | Admitting: Family Medicine

## 2019-06-24 ENCOUNTER — Encounter: Payer: Self-pay | Admitting: Gastroenterology

## 2019-06-24 ENCOUNTER — Encounter: Payer: BC Managed Care – PPO | Admitting: Gastroenterology

## 2019-07-05 ENCOUNTER — Other Ambulatory Visit: Payer: Self-pay | Admitting: Family Medicine

## 2019-07-07 ENCOUNTER — Telehealth: Payer: Self-pay

## 2019-07-07 NOTE — Telephone Encounter (Signed)
Covid-19 screening questions   Do you now or have you had a fever in the last 14 days?  Do you have any respiratory symptoms of shortness of breath or cough now or in the last 14 days?  Do you have any family members or close contacts with diagnosed or suspected Covid-19 in the past 14 days?  Have you been tested for Covid-19 and found to be positive?       

## 2019-07-08 ENCOUNTER — Encounter: Payer: Self-pay | Admitting: Gastroenterology

## 2019-07-08 ENCOUNTER — Other Ambulatory Visit: Payer: Self-pay

## 2019-07-08 ENCOUNTER — Ambulatory Visit (AMBULATORY_SURGERY_CENTER): Payer: BC Managed Care – PPO | Admitting: Gastroenterology

## 2019-07-08 VITALS — BP 93/71 | HR 59 | Temp 98.9°F | Resp 12

## 2019-07-08 DIAGNOSIS — D127 Benign neoplasm of rectosigmoid junction: Secondary | ICD-10-CM

## 2019-07-08 DIAGNOSIS — D122 Benign neoplasm of ascending colon: Secondary | ICD-10-CM

## 2019-07-08 DIAGNOSIS — D124 Benign neoplasm of descending colon: Secondary | ICD-10-CM

## 2019-07-08 DIAGNOSIS — Z1211 Encounter for screening for malignant neoplasm of colon: Secondary | ICD-10-CM | POA: Diagnosis not present

## 2019-07-08 DIAGNOSIS — D125 Benign neoplasm of sigmoid colon: Secondary | ICD-10-CM

## 2019-07-08 DIAGNOSIS — Z86010 Personal history of colonic polyps: Secondary | ICD-10-CM

## 2019-07-08 MED ORDER — SODIUM CHLORIDE 0.9 % IV SOLN: 500.0000 mL | Freq: Once | INTRAVENOUS | Status: DC

## 2019-07-08 NOTE — Progress Notes (Signed)
Called to room to assist during endoscopic procedure.  Patient ID and intended procedure confirmed with present staff. Received instructions for my participation in the procedure from the performing physician.  

## 2019-07-08 NOTE — Patient Instructions (Signed)
You had four polyps today.  Read all of the handouts given to you by your recovery room nurse.  Thank-you for choosing [place] for your healthcare needs today.  YOU HAD AN ENDOSCOPIC PROCEDURE TODAY AT [facility]:   Refer to the procedure report that was given to you for any specific questions about what was found during the examination.  If the procedure report does not answer your questions, please call your gastroenterologist to clarify.  If you requested that your care partner not be given the details of your procedure findings, then the procedure report has been included in a sealed envelope for you to review at your convenience later.  YOU SHOULD EXPECT: Some feelings of bloating in the abdomen. Passage of more gas than usual.  Walking can help get rid of the air that was put into your GI tract during the procedure and reduce the bloating. If you had a lower endoscopy (such as a colonoscopy or flexible sigmoidoscopy) you may notice spotting of blood in your stool or on the toilet paper. If you underwent a bowel prep for your procedure, you may not have a normal bowel movement for a few days.  Please Note:  You might notice some irritation and congestion in your nose or some drainage.  This is from the oxygen used during your procedure.  There is no need for concern and it should clear up in a day or so.  SYMPTOMS TO REPORT IMMEDIATELY:   Following lower endoscopy (colonoscopy or flexible sigmoidoscopy):  Excessive amounts of blood in the stool  Significant tenderness or worsening of abdominal pains  Swelling of the abdomen that is new, acute  Fever of 100F or higher   For urgent or emergent issues, a gastroenterologist can be reached at any hour by calling [phone].   DIET:  We do recommend a small meal at first, but then you may proceed to your regular diet.  Drink plenty of fluids but you should avoid alcoholic beverages for 24 hours.  Try to increase the fiber in  your diet, and drink plenty of water.   Take one fiber con per day per Dr. Rush Landmark.  ACTIVITY:  You should plan to take it easy for the rest of today and you should NOT DRIVE or use heavy machinery until tomorrow (because of the sedation medicines used during the test).    FOLLOW UP: Our staff will call the number listed on your records 48-72 hours following your procedure to check on you and address any questions or concerns that you may have regarding the information given to you following your procedure. If we do not reach you, we will leave a message.  We will attempt to reach you two times.  During this call, we will ask if you have developed any symptoms of COVID 19. If you develop any symptoms (ie: fever, flu-like symptoms, shortness of breath, cough etc.) before then, please call [phone].  If you test positive for Covid 19 in the 2 weeks post procedure, please call and report this information to [place].    If any biopsies were taken you will be contacted by phone or by letter within the next 1-3 weeks.  Please call us at [phone] if you have not heard about the biopsies in 3 weeks.    SIGNATURES/CONFIDENTIALITY: You and/or your care partner have signed paperwork which will be entered into your electronic medical record.  These signatures attest to the fact that that the information  above on your After Visit Summary has been reviewed and is understood.  Full responsibility of the confidentiality of this discharge information lies with you and/or your care-partner. 

## 2019-07-08 NOTE — Op Note (Signed)
[facility] [facility] Patient Name: Steve Beasley Procedure Date: 07/08/2019 10:26 AM MRN: [id number] Endoscopist: Justice Britain , MD Age: 53 Referring MD:  Date of Birth: Apr 29, 1966 Gender: Male Account #: [id number] Procedure:                Colonoscopy Indications:              Surveillance: Personal history of adenomatous                            polyps on last colonoscopy > 5 years ago (2011 with                            TA) Medicines:                Monitored Anesthesia Care Procedure:                Pre-Anesthesia Assessment:                           - Prior to the procedure, a History and Physical                            was performed, and patient medications and                            allergies were reviewed. The patient's tolerance of                            previous anesthesia was also reviewed. The risks                            and benefits of the procedure and the sedation                            options and risks were discussed with the patient.                            All questions were answered, and informed consent                            was obtained. Prior Anticoagulants: The patient has                            taken no previous anticoagulant or antiplatelet                            agents. ASA Grade Assessment: II - A patient with                            mild systemic disease. After reviewing the risks                            and benefits, the patient was deemed in  satisfactory condition to undergo the procedure.                           After obtaining informed consent, the colonoscope                            was passed under direct vision. Throughout the                            procedure, the patient's blood pressure, pulse, and                            oxygen saturations were monitored continuously. The                            Colonoscope was introduced through the anus and                        advanced to the 5 cm into the ileum. The                            colonoscopy was performed without difficulty. The                            patient tolerated the procedure. The quality of the                            bowel preparation was good. The terminal ileum,                            ileocecal valve, appendiceal orifice, and rectum                            were photographed. Scope In: 10:30:29 AM Scope Out: 10:50:04 AM Scope Withdrawal Time: 0 hours 15 minutes 5 seconds  Total Procedure Duration: 0 hours 19 minutes 35 seconds  Findings:                 Skin tags were found on perianal exam.                           The digital rectal exam findings include                            hemorrhoids. Pertinent negatives include no                            palpable rectal lesions.                           The terminal ileum and ileocecal valve appeared                            normal.  Four sessile polyps were found in the recto-sigmoid                            colon (1), sigmoid colon (1), descending colon (1)                            and ascending colon (1). The polyps were 1 to 4 mm                            in size. These polyps were removed with a cold                            snare. Resection and retrieval were complete.                           A few small-mouthed diverticula were found in the                            sigmoid colon.                           Normal mucosa was found in the entire colon                            otherwise.                           Non-bleeding non-thrombosed internal hemorrhoids                            were found during retroflexion, during perianal                            exam and during digital exam. The hemorrhoids were                            Grade II (internal hemorrhoids that prolapse but                            reduce spontaneously). Complications:             No immediate complications. Estimated Blood Loss:     Estimated blood loss was minimal. Impression:               - Perianal skin tags found on perianal exam.                           - Hemorrhoids found on digital rectal exam.                           - The examined portion of the ileum was normal.                           - Four 1 to 4 mm polyps at the recto-sigmoid colon,  in the sigmoid colon, in the descending colon and                            in the ascending colon, removed with a cold snare.                            Resected and retrieved.                           - Diverticulosis in the sigmoid colon.                           - Normal mucosa in the entire examined colon.                           - Non-bleeding non-thrombosed internal hemorrhoids. Recommendation:           - The patient will be observed post-procedure,                            until all discharge criteria are met.                           - Discharge patient to home.                           - Patient has a contact number available for                            emergencies. The signs and symptoms of potential                            delayed complications were discussed with the                            patient. Return to normal activities tomorrow.                            Written discharge instructions were provided to the                            patient.                           - High fiber diet.                           - Use FiberCon 1 tablet PO daily.                           - Continue present medications.                           - Await pathology results.                           -  Repeat colonoscopy 3/5/7 years for surveillance                            based on pathology results and adenomatous tissue.                            Even if no adenomatous tissue is found due to prior                            history of adenomas would return patient  for a                            7-year follow up at maximum.                           - The findings and recommendations were discussed                            with the patient. Justice Britain, MD 07/08/2019 10:56:37 AM

## 2019-07-08 NOTE — Telephone Encounter (Signed)
Pt responded "no" to all screening questions °

## 2019-07-12 ENCOUNTER — Encounter: Payer: Self-pay | Admitting: Gastroenterology

## 2019-07-12 ENCOUNTER — Telehealth: Payer: Self-pay | Admitting: *Deleted

## 2019-07-12 NOTE — Telephone Encounter (Signed)
  Follow up Call-  Call back number 07/08/2019  Post procedure Call Back phone  # [phone]  Permission to leave phone message Yes  Some recent data might be hidden     Patient questions:  Do you have a fever, pain , or abdominal swelling? No. Pain Score  0 *  Have you tolerated food without any problems? Yes.    Have you been able to return to your normal activities? Yes.    Do you have any questions about your discharge instructions: Diet   No. Medications  No. Follow up visit  No.  Do you have questions or concerns about your Care? No.  Actions: * If pain score is 4 or above: No action needed, pain <4.  1. Have you developed a fever since your procedure? no  2.   Have you had an respiratory symptoms (SOB or cough) since your procedure? no  3.   Have you tested positive for COVID 19 since your procedure no  4.   Have you had any family members/close contacts diagnosed with the COVID 19 since your procedure?  no   If yes to any of these questions please route to Joylene John, RN and Alphonsa Gin, Therapist, sports.

## 2019-07-12 NOTE — Telephone Encounter (Signed)
No answer for post procedure follow up left message and will call him back later today.

## 2019-08-06 ENCOUNTER — Other Ambulatory Visit: Payer: BC Managed Care – PPO

## 2019-08-18 ENCOUNTER — Other Ambulatory Visit (INDEPENDENT_AMBULATORY_CARE_PROVIDER_SITE_OTHER): Payer: BC Managed Care – PPO

## 2019-08-18 ENCOUNTER — Other Ambulatory Visit: Payer: Self-pay

## 2019-08-18 DIAGNOSIS — E119 Type 2 diabetes mellitus without complications: Secondary | ICD-10-CM | POA: Diagnosis not present

## 2019-08-18 DIAGNOSIS — E785 Hyperlipidemia, unspecified: Secondary | ICD-10-CM

## 2019-08-18 DIAGNOSIS — I10 Essential (primary) hypertension: Secondary | ICD-10-CM

## 2019-08-18 DIAGNOSIS — Z0000 Encounter for general adult medical examination without abnormal findings: Secondary | ICD-10-CM | POA: Diagnosis not present

## 2019-08-18 LAB — LIPID PANEL
Cholesterol: 127 mg/dL (ref 0–200)
HDL: 28.8 mg/dL — ABNORMAL LOW (ref >39.00)
NonHDL: 97.92
Total CHOL/HDL Ratio: 4
Triglycerides: 269 mg/dL — ABNORMAL HIGH (ref 0.0–149.0)
VLDL: 53.8 mg/dL — ABNORMAL HIGH (ref 0.0–40.0)

## 2019-08-18 LAB — COMPREHENSIVE METABOLIC PANEL
ALT: 70 U/L — ABNORMAL HIGH (ref 0–53)
AST: 36 U/L (ref 0–37)
Albumin: 4.3 g/dL (ref 3.5–5.2)
Alkaline Phosphatase: 93 U/L (ref 39–117)
BUN: 22 mg/dL (ref 6–23)
CO2: 27 mEq/L (ref 19–32)
Calcium: 10.7 mg/dL — ABNORMAL HIGH (ref 8.4–10.5)
Chloride: 100 mEq/L (ref 96–112)
Creatinine, Ser: 1.43 mg/dL (ref 0.40–1.50)
GFR: 51.47 mL/min — ABNORMAL LOW (ref >60.00)
Glucose, Bld: 140 mg/dL — ABNORMAL HIGH (ref 70–99)
Potassium: 3.8 mEq/L (ref 3.5–5.1)
Sodium: 136 mEq/L (ref 135–145)
Total Bilirubin: 1.4 mg/dL — ABNORMAL HIGH (ref 0.2–1.2)
Total Protein: 6.6 g/dL (ref 6.0–8.3)

## 2019-08-18 LAB — CBC
HCT: 43.5 % (ref 39.0–52.0)
Hemoglobin: 14.6 g/dL (ref 13.0–17.0)
MCHC: 33.6 g/dL (ref 30.0–36.0)
MCV: 90 fl (ref 78.0–100.0)
Platelets: 149 10*3/uL — ABNORMAL LOW (ref 150.0–400.0)
RBC: 4.83 Mil/uL (ref 4.22–5.81)
RDW: 13.5 % (ref 11.5–15.5)
WBC: 9.1 10*3/uL (ref 4.0–10.5)

## 2019-08-18 LAB — LDL CHOLESTEROL, DIRECT: Direct LDL: 68 mg/dL

## 2019-08-18 LAB — TSH: TSH: 2.76 u[IU]/mL (ref 0.35–4.50)

## 2019-08-18 LAB — HEMOGLOBIN A1C: Hgb A1c MFr Bld: 6.3 % (ref 4.6–6.5)

## 2019-08-19 LAB — VITAMIN D 25 HYDROXY (VIT D DEFICIENCY, FRACTURES): VITD: 34.57 ng/mL (ref 30.00–100.00)

## 2019-08-20 ENCOUNTER — Other Ambulatory Visit: Payer: Self-pay | Admitting: Family Medicine

## 2019-08-20 ENCOUNTER — Other Ambulatory Visit: Payer: Self-pay | Admitting: *Deleted

## 2019-08-20 ENCOUNTER — Telehealth: Payer: Self-pay | Admitting: *Deleted

## 2019-08-20 ENCOUNTER — Encounter: Payer: Self-pay | Admitting: Family Medicine

## 2019-08-20 MED ORDER — ATORVASTATIN CALCIUM 20 MG PO TABS: 20.0000 mg | ORAL_TABLET | Freq: Every day | ORAL | 1 refills | Status: DC

## 2019-08-20 NOTE — Telephone Encounter (Signed)
Patient notified lab results and new dose of atorvastatin sent in,

## 2019-09-08 DIAGNOSIS — D485 Neoplasm of uncertain behavior of skin: Secondary | ICD-10-CM | POA: Diagnosis not present

## 2019-09-08 DIAGNOSIS — D225 Melanocytic nevi of trunk: Secondary | ICD-10-CM | POA: Diagnosis not present

## 2019-09-08 DIAGNOSIS — D2239 Melanocytic nevi of other parts of face: Secondary | ICD-10-CM | POA: Diagnosis not present

## 2019-09-08 DIAGNOSIS — L821 Other seborrheic keratosis: Secondary | ICD-10-CM | POA: Diagnosis not present

## 2019-10-02 ENCOUNTER — Other Ambulatory Visit: Payer: Self-pay | Admitting: Family Medicine

## 2019-10-14 ENCOUNTER — Other Ambulatory Visit: Payer: Self-pay

## 2019-10-14 ENCOUNTER — Telehealth: Payer: Self-pay | Admitting: Family Medicine

## 2019-10-14 DIAGNOSIS — I10 Essential (primary) hypertension: Secondary | ICD-10-CM

## 2019-10-14 DIAGNOSIS — E785 Hyperlipidemia, unspecified: Secondary | ICD-10-CM

## 2019-10-14 DIAGNOSIS — E119 Type 2 diabetes mellitus without complications: Secondary | ICD-10-CM

## 2019-10-14 DIAGNOSIS — R7989 Other specified abnormal findings of blood chemistry: Secondary | ICD-10-CM

## 2019-10-14 NOTE — Telephone Encounter (Signed)
LM for pt to call back to reschedule 11/08/2019 appt as Dr. Charlett Blake will be out of the office. Can we enter orders for labs to have prior to a virtual visit? Please advise.

## 2019-10-14 NOTE — Telephone Encounter (Signed)
Patient had labs done on 08/18/19. In order for insurance to pay for them they need to be done on 11/19/19 or later. He will need to either change date for appt with pcp or come back after his visit on 11/08/19 and have labs done. Labs ordered for after 11/19/19 Please advise

## 2019-10-29 ENCOUNTER — Other Ambulatory Visit: Payer: Self-pay | Admitting: Family Medicine

## 2019-11-08 ENCOUNTER — Ambulatory Visit: Payer: BC Managed Care – PPO | Admitting: Family Medicine

## 2019-11-11 ENCOUNTER — Other Ambulatory Visit: Payer: Self-pay | Admitting: Family Medicine

## 2019-11-19 ENCOUNTER — Other Ambulatory Visit: Payer: Self-pay | Admitting: Family Medicine

## 2019-11-23 ENCOUNTER — Other Ambulatory Visit: Payer: Self-pay

## 2019-11-23 ENCOUNTER — Other Ambulatory Visit (INDEPENDENT_AMBULATORY_CARE_PROVIDER_SITE_OTHER): Payer: BC Managed Care – PPO

## 2019-11-23 DIAGNOSIS — I10 Essential (primary) hypertension: Secondary | ICD-10-CM

## 2019-11-23 DIAGNOSIS — R7989 Other specified abnormal findings of blood chemistry: Secondary | ICD-10-CM | POA: Diagnosis not present

## 2019-11-23 DIAGNOSIS — E119 Type 2 diabetes mellitus without complications: Secondary | ICD-10-CM

## 2019-11-23 DIAGNOSIS — E785 Hyperlipidemia, unspecified: Secondary | ICD-10-CM

## 2019-11-23 LAB — COMPREHENSIVE METABOLIC PANEL
ALT: 49 U/L (ref 0–53)
AST: 31 U/L (ref 0–37)
Albumin: 4.5 g/dL (ref 3.5–5.2)
Alkaline Phosphatase: 88 U/L (ref 39–117)
BUN: 19 mg/dL (ref 6–23)
CO2: 28 mEq/L (ref 19–32)
Calcium: 11 mg/dL — ABNORMAL HIGH (ref 8.4–10.5)
Chloride: 99 mEq/L (ref 96–112)
Creatinine, Ser: 1.45 mg/dL (ref 0.40–1.50)
GFR: 50.6 mL/min — ABNORMAL LOW (ref >60.00)
Glucose, Bld: 151 mg/dL — ABNORMAL HIGH (ref 70–99)
Potassium: 4 mEq/L (ref 3.5–5.1)
Sodium: 136 mEq/L (ref 135–145)
Total Bilirubin: 1.4 mg/dL — ABNORMAL HIGH (ref 0.2–1.2)
Total Protein: 6.8 g/dL (ref 6.0–8.3)

## 2019-11-23 LAB — CBC
HCT: 41.8 % (ref 39.0–52.0)
Hemoglobin: 14.2 g/dL (ref 13.0–17.0)
MCHC: 34.1 g/dL (ref 30.0–36.0)
MCV: 89.1 fl (ref 78.0–100.0)
Platelets: 158 10*3/uL (ref 150.0–400.0)
RBC: 4.7 Mil/uL (ref 4.22–5.81)
RDW: 13.1 % (ref 11.5–15.5)
WBC: 8.7 10*3/uL (ref 4.0–10.5)

## 2019-11-23 LAB — LIPID PANEL
Cholesterol: 113 mg/dL (ref 0–200)
HDL: 29.9 mg/dL — ABNORMAL LOW (ref >39.00)
LDL Cholesterol: 46 mg/dL (ref 0–99)
NonHDL: 83.13
Total CHOL/HDL Ratio: 4
Triglycerides: 187 mg/dL — ABNORMAL HIGH (ref 0.0–149.0)
VLDL: 37.4 mg/dL (ref 0.0–40.0)

## 2019-11-23 LAB — HEMOGLOBIN A1C: Hgb A1c MFr Bld: 7.2 % — ABNORMAL HIGH (ref 4.6–6.5)

## 2019-11-23 LAB — VITAMIN D 25 HYDROXY (VIT D DEFICIENCY, FRACTURES): VITD: 33.89 ng/mL (ref 30.00–100.00)

## 2019-11-23 LAB — TSH: TSH: 3.48 u[IU]/mL (ref 0.35–4.50)

## 2019-11-25 ENCOUNTER — Ambulatory Visit: Payer: BC Managed Care – PPO | Attending: Internal Medicine

## 2019-11-25 DIAGNOSIS — Z20822 Contact with and (suspected) exposure to covid-19: Secondary | ICD-10-CM

## 2019-11-26 LAB — NOVEL CORONAVIRUS, NAA: SARS-CoV-2, NAA: NOT DETECTED

## 2019-11-29 ENCOUNTER — Ambulatory Visit: Payer: BC Managed Care – PPO | Admitting: Family Medicine

## 2019-12-03 ENCOUNTER — Other Ambulatory Visit: Payer: Self-pay

## 2019-12-03 ENCOUNTER — Ambulatory Visit (INDEPENDENT_AMBULATORY_CARE_PROVIDER_SITE_OTHER): Payer: BC Managed Care – PPO | Admitting: Family Medicine

## 2019-12-03 DIAGNOSIS — E785 Hyperlipidemia, unspecified: Secondary | ICD-10-CM

## 2019-12-03 DIAGNOSIS — M8580 Other specified disorders of bone density and structure, unspecified site: Secondary | ICD-10-CM

## 2019-12-03 DIAGNOSIS — E119 Type 2 diabetes mellitus without complications: Secondary | ICD-10-CM | POA: Diagnosis not present

## 2019-12-03 DIAGNOSIS — M109 Gout, unspecified: Secondary | ICD-10-CM

## 2019-12-03 DIAGNOSIS — I10 Essential (primary) hypertension: Secondary | ICD-10-CM

## 2019-12-03 DIAGNOSIS — R7989 Other specified abnormal findings of blood chemistry: Secondary | ICD-10-CM

## 2019-12-03 DIAGNOSIS — N289 Disorder of kidney and ureter, unspecified: Secondary | ICD-10-CM

## 2019-12-03 NOTE — Progress Notes (Signed)
Virtual Visit via Video Note  I connected with Steve Beasley on 12/03/19 at  8:20 AM EST by a video enabled telemedicine application and verified that I am speaking with the correct person using two identifiers.  Location: Patient: home Provider: office   I discussed the limitations of evaluation and management by telemedicine and the availability of in person appointments. The patient expressed understanding and agreed to proceed. Magdalene Molly, CMA was able to get the patient set up on a visit, video   Subjective:    Patient ID: Steve Beasley, male    DOB: 12/16/1965, 54 y.o.   MRN: [id number]  No chief complaint on file.   HPI Patient is in today for follow up on a chronic medical concerns. He has taken a new job and has been working 80-100 hours a week and thus not exercising or eating right. When he saw his hgba1c had increased to 7.2 he started making time to exercise again and he feels some better. His sugars are back down int he low 100s and he even had a 68 once. No recent febrile illness or hospitalizations. Denies CP/palp/SOB/HA/congestion/fevers/GI or GU c/o. Taking meds as prescribed  Past Medical History:  Diagnosis Date  . Allergy-induced asthma   . Anxiety state, unspecified 09/19/2013  . Atypical chest pain 11/13/2014  . Basal cell carcinoma of scalp 09/09/2016   and back  . Diabetes mellitus without complication ([place])   . Fatty liver disease, nonalcoholic [id number]  . Gout 09/18/2014  . Hypertension   . Melanoma ([place])    left upper arm  . Nicotine use disorder 06/06/2013   smokeless  . Obesity, unspecified 06/06/2013  . Osteopenia   . Other and unspecified hyperlipidemia 01/19/2014  . Preventative health care 09/19/2013  . Right heart enlargement 09/18/2014  . Testicular cancer ([place])   . Tinnitus 03/24/2017  . Valvular heart disease 09/18/2014    Past Surgical History:  Procedure Laterality Date  . BASAL CELL CARCINOMA EXCISION     Within 6 months    . COLONOSCOPY    . POLYPECTOMY    . SKIN CANCER EXCISION     Within 6 months  . testicular cancer surgery      Family History  Problem Relation Age of Onset  . Diabetes Mother   . Breast cancer Mother   . Bone cancer Mother   . Cancer Mother        breast with bone mets  . Depression Mother   . Hypertension Father   . Prostate cancer Father   . Hyperlipidemia Father   . Cancer Father        prostate  . Colon polyps Father   . Stroke Paternal Grandfather   . Prostate cancer Maternal Grandfather   . Hypertension Brother   . Gallstones Brother   . Anxiety disorder Daughter   . ADD / ADHD Daughter   . Bulemia Daughter   . Depression Son   . Stroke Maternal Uncle 65  . Anxiety disorder Maternal Aunt        social anxiety, anger  . Heart attack Neg Hx   . Sudden death Neg Hx   . Colon cancer Neg Hx   . Stomach cancer Neg Hx   . Rectal cancer Neg Hx   . Esophageal cancer Neg Hx     Social History   Socioeconomic History  . Marital status: Married    Spouse name: Not on file  . Number of children: 2  .  Years of education: Not on file  . Highest education level: Not on file  Occupational History  . Occupation: finance  Tobacco Use  . Smoking status: Former Smoker    Types: Cigarettes  . Smokeless tobacco: Former Systems developer    Quit date: 02/28/2014  . Tobacco comment: 30 years ago socially/ 06/10/19 pt  uses oral nicotine chew tobacco free   Substance and Sexual Activity  . Alcohol use: Yes    Alcohol/week: 0.0 standard drinks    Comment: 2 drinks per day varies   . Drug use: No  . Sexual activity: Yes    Comment: lives with wife, travels with work, no dietary restrictions , exercise regular  Other Topics Concern  . Not on file  Social History Narrative  . Not on file   Social Determinants of Health   Financial Resource Strain:   . Difficulty of Paying Living Expenses: Not on file  Food Insecurity:   . Worried About Charity fundraiser in the Last Year: Not on  file  . Ran Out of Food in the Last Year: Not on file  Transportation Needs:   . Lack of Transportation (Medical): Not on file  . Lack of Transportation (Non-Medical): Not on file  Physical Activity:   . Days of Exercise per Week: Not on file  . Minutes of Exercise per Session: Not on file  Stress:   . Feeling of Stress : Not on file  Social Connections:   . Frequency of Communication with Friends and Family: Not on file  . Frequency of Social Gatherings with Friends and Family: Not on file  . Attends Religious Services: Not on file  . Active Member of Clubs or Organizations: Not on file  . Attends Archivist Meetings: Not on file  . Marital Status: Not on file  Intimate Partner Violence:   . Fear of Current or Ex-Partner: Not on file  . Emotionally Abused: Not on file  . Physically Abused: Not on file  . Sexually Abused: Not on file    Outpatient Medications Prior to Visit  Medication Sig Dispense Refill  . albuterol (PROVENTIL HFA;VENTOLIN HFA) 108 (90 Base) MCG/ACT inhaler Inhale 2 puffs into the lungs every 6 (six) hours as needed for wheezing or shortness of breath. (Patient not taking: Reported on 07/08/2019) 1 Inhaler 2  . allopurinol (ZYLOPRIM) 100 MG tablet TAKE 1 TABLET(100 MG) BY MOUTH TWICE DAILY 180 tablet 1  . amLODipine (NORVASC) 2.5 MG tablet TAKE 1 TABLET(2.5 MG) BY MOUTH DAILY 90 tablet 1  . atorvastatin (LIPITOR) 20 MG tablet Take 1 tablet (20 mg total) by mouth daily. 90 tablet 1  . Cholecalciferol (VITAMIN D3) 5000 UNITS CAPS Take 1 capsule by mouth daily.    Marland Kitchen glimepiride (AMARYL) 2 MG tablet TAKE 1 TABLET(2 MG) BY MOUTH TWICE DAILY WITH A MEAL 180 tablet 0  . glucose blood (ONETOUCH ULTRA) test strip Check blood sugar every day and as directed.  Dx code: E11.9 100 strip 1  . losartan (COZAAR) 100 MG tablet TAKE 1 TABLET(100 MG) BY MOUTH DAILY 90 tablet 0  . metFORMIN (GLUCOPHAGE) 1000 MG tablet TAKE 1 TABLET(1000 MG) BY MOUTH TWICE DAILY WITH A MEAL  180 tablet 0  . triamterene-hydrochlorothiazide (MAXZIDE-25) 37.5-25 MG tablet TAKE 1 TABLET BY MOUTH DAILY 90 tablet 0   No facility-administered medications prior to visit.    No Known Allergies  Review of Systems  Constitutional: Positive for malaise/fatigue. Negative for fever.  HENT: Negative for  congestion.   Eyes: Negative for blurred vision.  Respiratory: Negative for shortness of breath.   Cardiovascular: Negative for chest pain, palpitations and leg swelling.  Gastrointestinal: Negative for abdominal pain, blood in stool and nausea.  Genitourinary: Negative for dysuria and frequency.  Musculoskeletal: Negative for falls.  Skin: Negative for rash.  Neurological: Negative for dizziness, loss of consciousness and headaches.  Endo/Heme/Allergies: Negative for environmental allergies.  Psychiatric/Behavioral: Negative for depression. The patient is not nervous/anxious.        Objective:    Physical Exam Constitutional:      Appearance: Normal appearance. He is not ill-appearing.  HENT:     Head: Normocephalic and atraumatic.  Eyes:     General:        Right eye: No discharge.        Left eye: No discharge.  Pulmonary:     Effort: Pulmonary effort is normal.  Neurological:     Mental Status: He is alert and oriented to person, place, and time.  Psychiatric:        Behavior: Behavior normal.     There were no vitals taken for this visit. Wt Readings from Last 3 Encounters:  06/10/19 222 lb (100.7 kg)  05/09/19 216 lb (98 kg)  10/30/18 227 lb 9.6 oz (103.2 kg)    Diabetic Foot Exam - Simple   No data filed     Lab Results  Component Value Date   WBC 8.7 11/23/2019   HGB 14.2 11/23/2019   HCT 41.8 11/23/2019   PLT 158.0 11/23/2019   GLUCOSE 151 (H) 11/23/2019   CHOL 113 11/23/2019   TRIG 187.0 (H) 11/23/2019   HDL 29.90 (L) 11/23/2019   LDLDIRECT 68.0 08/18/2019   LDLCALC 46 11/23/2019   ALT 49 11/23/2019   AST 31 11/23/2019   NA 136 11/23/2019    K 4.0 11/23/2019   CL 99 11/23/2019   CREATININE 1.45 11/23/2019   BUN 19 11/23/2019   CO2 28 11/23/2019   TSH 3.48 11/23/2019   PSA 2.90 03/27/2016   HGBA1C 7.2 (H) 11/23/2019   MICROALBUR 1.3 08/21/2015    Lab Results  Component Value Date   TSH 3.48 11/23/2019   Lab Results  Component Value Date   WBC 8.7 11/23/2019   HGB 14.2 11/23/2019   HCT 41.8 11/23/2019   MCV 89.1 11/23/2019   PLT 158.0 11/23/2019   Lab Results  Component Value Date   NA 136 11/23/2019   K 4.0 11/23/2019   CO2 28 11/23/2019   GLUCOSE 151 (H) 11/23/2019   BUN 19 11/23/2019   CREATININE 1.45 11/23/2019   BILITOT 1.4 (H) 11/23/2019   ALKPHOS 88 11/23/2019   AST 31 11/23/2019   ALT 49 11/23/2019   PROT 6.8 11/23/2019   ALBUMIN 4.5 11/23/2019   CALCIUM 11.0 (H) 11/23/2019   GFR 50.60 (L) 11/23/2019   Lab Results  Component Value Date   CHOL 113 11/23/2019   Lab Results  Component Value Date   HDL 29.90 (L) 11/23/2019   Lab Results  Component Value Date   LDLCALC 46 11/23/2019   Lab Results  Component Value Date   TRIG 187.0 (H) 11/23/2019   Lab Results  Component Value Date   CHOLHDL 4 11/23/2019   Lab Results  Component Value Date   HGBA1C 7.2 (H) 11/23/2019       Assessment & Plan:   Problem List Items Addressed This Visit    DM (diabetes mellitus), type 2 ([place])  hgba1c up to due less exercise and poorer diet since he took a new job and is working too many hours. Since seeing his numbers he is exercising again and eating better and his fingersticks have improved dramatically      Relevant Orders   Hemoglobin A1c   HTN (hypertension) - Primary    Monitor and report any concerns, no changes to meds. Encouraged heart healthy diet such as the DASH diet and exercise as tolerated.       Relevant Orders   CBC   Comprehensive metabolic panel   TSH   Osteopenia    He has a noted history of osteopenia and a history of a spinal compression fracture and stress fracture  in his foot so he has a new Dexa scan order in place and will proceed. Encouraged to get adequate exercise, calcium and vitamin d intake      Hyperlipidemia    Tolerating statin, encouraged heart healthy diet, avoid trans fats, minimize simple carbs and saturated fats. Increase exercise as tolerated      Relevant Orders   Lipid panel   Gout   Relevant Orders   Uric acid   Renal insufficiency    Hydrate and monitor      Low vitamin D level    Supplement and monitor      Relevant Orders   PTH, intact (no Ca)   Calcium, ionized   VITAMIN D 25 Hydroxy (Vit-D Deficiency, Fractures)   Hypercalcemia   Relevant Orders   PTH, intact (no Ca)   Calcium, ionized   VITAMIN D 25 Hydroxy (Vit-D Deficiency, Fractures)      I am having Luciano Cutter. Larene Beach maintain his Vitamin D3, albuterol, OneTouch Ultra, atorvastatin, losartan, triamterene-hydrochlorothiazide, amLODipine, allopurinol, metFORMIN, and glimepiride.  No orders of the defined types were placed in this encounter.    I discussed the assessment and treatment plan with the patient. The patient was provided an opportunity to ask questions and all were answered. The patient agreed with the plan and demonstrated an understanding of the instructions.   The patient was advised to call back or seek an in-person evaluation if the symptoms worsen or if the condition fails to improve as anticipated.  I provided 25 minutes of non-face-to-face time during this encounter.   Penni Homans, MD

## 2019-12-03 NOTE — Assessment & Plan Note (Signed)
Hydrate and monitor 

## 2019-12-03 NOTE — Assessment & Plan Note (Signed)
>>  ASSESSMENT AND PLAN FOR HTN (HYPERTENSION) WRITTEN ON 12/03/2019  1:32 PM BY BLYTH, STACEY A, MD  Monitor and report any concerns, no changes to meds. Encouraged heart healthy diet such as the DASH diet and exercise as tolerated.

## 2019-12-03 NOTE — Assessment & Plan Note (Signed)
Monitor and report any concerns, no changes to meds. Encouraged heart healthy diet such as the DASH diet and exercise as tolerated.  ?

## 2019-12-03 NOTE — Patient Instructions (Signed)
[facility] Blood Pressure cuff, upper arm, want BP 100-140/60-90 Pulse oximeter, want oxygen in 90s  Weekly vitals  Take Multivitamin with minerals, selenium Vitamin D 1000-2000 IU daily Probiotic with lactobacillus and bifidophilus Asprin EC 81 mg daily  Melatonin 2-5 mg at bedtime  [email or web address] 

## 2019-12-03 NOTE — Assessment & Plan Note (Signed)
Supplement and monitor 

## 2019-12-03 NOTE — Assessment & Plan Note (Signed)
hgba1c up to due less exercise and poorer diet since he took a new job and is working too many hours. Since seeing his numbers he is exercising again and eating better and his fingersticks have improved dramatically

## 2019-12-03 NOTE — Assessment & Plan Note (Signed)
He has a noted history of osteopenia and a history of a spinal compression fracture and stress fracture in his foot so he has a new Dexa scan order in place and will proceed. Encouraged to get adequate exercise, calcium and vitamin d intake

## 2019-12-03 NOTE — Assessment & Plan Note (Signed)
Tolerating statin, encouraged heart healthy diet, avoid trans fats, minimize simple carbs and saturated fats. Increase exercise as tolerated 

## 2019-12-14 ENCOUNTER — Other Ambulatory Visit: Payer: Self-pay | Admitting: Family Medicine

## 2020-01-02 ENCOUNTER — Other Ambulatory Visit: Payer: Self-pay | Admitting: Family Medicine

## 2020-01-27 ENCOUNTER — Other Ambulatory Visit: Payer: Self-pay | Admitting: Family Medicine

## 2020-02-14 DIAGNOSIS — L814 Other melanin hyperpigmentation: Secondary | ICD-10-CM | POA: Diagnosis not present

## 2020-02-14 DIAGNOSIS — Z85820 Personal history of malignant melanoma of skin: Secondary | ICD-10-CM | POA: Diagnosis not present

## 2020-02-14 DIAGNOSIS — L821 Other seborrheic keratosis: Secondary | ICD-10-CM | POA: Diagnosis not present

## 2020-02-14 DIAGNOSIS — D225 Melanocytic nevi of trunk: Secondary | ICD-10-CM | POA: Diagnosis not present

## 2020-02-18 ENCOUNTER — Other Ambulatory Visit: Payer: Self-pay | Admitting: Family Medicine

## 2020-04-25 ENCOUNTER — Other Ambulatory Visit: Payer: Self-pay | Admitting: Family Medicine

## 2020-04-28 ENCOUNTER — Other Ambulatory Visit: Payer: Self-pay | Admitting: Family Medicine

## 2020-05-17 ENCOUNTER — Other Ambulatory Visit: Payer: Self-pay | Admitting: Family Medicine

## 2020-05-21 ENCOUNTER — Other Ambulatory Visit: Payer: Self-pay | Admitting: Family Medicine

## 2020-05-23 ENCOUNTER — Other Ambulatory Visit: Payer: Self-pay | Admitting: Family Medicine

## 2020-06-01 ENCOUNTER — Other Ambulatory Visit: Payer: Self-pay | Admitting: Family Medicine

## 2020-06-21 DIAGNOSIS — Z20822 Contact with and (suspected) exposure to covid-19: Secondary | ICD-10-CM | POA: Diagnosis not present

## 2020-06-21 DIAGNOSIS — R05 Cough: Secondary | ICD-10-CM | POA: Diagnosis not present

## 2020-06-22 ENCOUNTER — Other Ambulatory Visit: Payer: Self-pay | Admitting: Family Medicine

## 2020-07-17 DIAGNOSIS — D485 Neoplasm of uncertain behavior of skin: Secondary | ICD-10-CM | POA: Diagnosis not present

## 2020-07-17 DIAGNOSIS — C4441 Basal cell carcinoma of skin of scalp and neck: Secondary | ICD-10-CM | POA: Diagnosis not present

## 2020-08-02 ENCOUNTER — Ambulatory Visit (INDEPENDENT_AMBULATORY_CARE_PROVIDER_SITE_OTHER): Payer: BC Managed Care – PPO | Admitting: Psychology

## 2020-08-02 DIAGNOSIS — F4321 Adjustment disorder with depressed mood: Secondary | ICD-10-CM

## 2020-08-04 ENCOUNTER — Other Ambulatory Visit: Payer: Self-pay | Admitting: Family Medicine

## 2020-08-18 ENCOUNTER — Ambulatory Visit (INDEPENDENT_AMBULATORY_CARE_PROVIDER_SITE_OTHER): Payer: BC Managed Care – PPO | Admitting: Psychology

## 2020-08-18 DIAGNOSIS — L821 Other seborrheic keratosis: Secondary | ICD-10-CM | POA: Diagnosis not present

## 2020-08-18 DIAGNOSIS — F4321 Adjustment disorder with depressed mood: Secondary | ICD-10-CM | POA: Diagnosis not present

## 2020-08-18 DIAGNOSIS — D2239 Melanocytic nevi of other parts of face: Secondary | ICD-10-CM | POA: Diagnosis not present

## 2020-08-18 DIAGNOSIS — L814 Other melanin hyperpigmentation: Secondary | ICD-10-CM | POA: Diagnosis not present

## 2020-08-18 DIAGNOSIS — D225 Melanocytic nevi of trunk: Secondary | ICD-10-CM | POA: Diagnosis not present

## 2020-08-20 ENCOUNTER — Other Ambulatory Visit: Payer: Self-pay | Admitting: Family Medicine

## 2020-08-21 DIAGNOSIS — E119 Type 2 diabetes mellitus without complications: Secondary | ICD-10-CM | POA: Diagnosis not present

## 2020-08-21 DIAGNOSIS — R9431 Abnormal electrocardiogram [ECG] [EKG]: Secondary | ICD-10-CM | POA: Diagnosis not present

## 2020-08-21 DIAGNOSIS — R9439 Abnormal result of other cardiovascular function study: Secondary | ICD-10-CM | POA: Diagnosis not present

## 2020-08-21 DIAGNOSIS — I10 Essential (primary) hypertension: Secondary | ICD-10-CM | POA: Diagnosis not present

## 2020-08-21 DIAGNOSIS — M8580 Other specified disorders of bone density and structure, unspecified site: Secondary | ICD-10-CM | POA: Diagnosis not present

## 2020-08-21 DIAGNOSIS — K219 Gastro-esophageal reflux disease without esophagitis: Secondary | ICD-10-CM | POA: Diagnosis not present

## 2020-08-21 DIAGNOSIS — Z79899 Other long term (current) drug therapy: Secondary | ICD-10-CM | POA: Diagnosis not present

## 2020-08-21 DIAGNOSIS — E663 Overweight: Secondary | ICD-10-CM | POA: Diagnosis not present

## 2020-08-21 DIAGNOSIS — Z0001 Encounter for general adult medical examination with abnormal findings: Secondary | ICD-10-CM | POA: Diagnosis not present

## 2020-08-21 DIAGNOSIS — Z125 Encounter for screening for malignant neoplasm of prostate: Secondary | ICD-10-CM | POA: Diagnosis not present

## 2020-08-21 DIAGNOSIS — E785 Hyperlipidemia, unspecified: Secondary | ICD-10-CM | POA: Diagnosis not present

## 2020-08-21 DIAGNOSIS — R7989 Other specified abnormal findings of blood chemistry: Secondary | ICD-10-CM | POA: Diagnosis not present

## 2020-08-21 DIAGNOSIS — Z8781 Personal history of (healed) traumatic fracture: Secondary | ICD-10-CM | POA: Diagnosis not present

## 2020-08-21 DIAGNOSIS — Z1211 Encounter for screening for malignant neoplasm of colon: Secondary | ICD-10-CM | POA: Diagnosis not present

## 2020-08-21 DIAGNOSIS — Z87891 Personal history of nicotine dependence: Secondary | ICD-10-CM | POA: Diagnosis not present

## 2020-08-22 DIAGNOSIS — R9439 Abnormal result of other cardiovascular function study: Secondary | ICD-10-CM | POA: Diagnosis not present

## 2020-09-01 ENCOUNTER — Ambulatory Visit (INDEPENDENT_AMBULATORY_CARE_PROVIDER_SITE_OTHER): Payer: BC Managed Care – PPO | Admitting: Psychology

## 2020-09-01 DIAGNOSIS — F4321 Adjustment disorder with depressed mood: Secondary | ICD-10-CM

## 2020-09-15 ENCOUNTER — Ambulatory Visit (INDEPENDENT_AMBULATORY_CARE_PROVIDER_SITE_OTHER): Payer: BC Managed Care – PPO | Admitting: Psychology

## 2020-09-15 DIAGNOSIS — F4321 Adjustment disorder with depressed mood: Secondary | ICD-10-CM

## 2020-09-25 ENCOUNTER — Ambulatory Visit (INDEPENDENT_AMBULATORY_CARE_PROVIDER_SITE_OTHER): Payer: BC Managed Care – PPO | Admitting: Psychology

## 2020-09-25 DIAGNOSIS — F4321 Adjustment disorder with depressed mood: Secondary | ICD-10-CM | POA: Diagnosis not present

## 2020-10-06 ENCOUNTER — Ambulatory Visit (INDEPENDENT_AMBULATORY_CARE_PROVIDER_SITE_OTHER): Payer: BC Managed Care – PPO | Admitting: Psychology

## 2020-10-06 ENCOUNTER — Other Ambulatory Visit: Payer: Self-pay | Admitting: Family Medicine

## 2020-10-06 DIAGNOSIS — F4321 Adjustment disorder with depressed mood: Secondary | ICD-10-CM

## 2020-10-11 ENCOUNTER — Encounter: Payer: Self-pay | Admitting: Family Medicine

## 2020-10-12 ENCOUNTER — Other Ambulatory Visit: Payer: Self-pay | Admitting: Family Medicine

## 2020-10-12 MED ORDER — LOSARTAN POTASSIUM 100 MG PO TABS: 100.0000 mg | ORAL_TABLET | Freq: Every day | ORAL | 0 refills | Status: DC

## 2020-10-12 MED ORDER — ATORVASTATIN CALCIUM 20 MG PO TABS: ORAL_TABLET | ORAL | 0 refills | Status: DC

## 2020-10-12 MED ORDER — METFORMIN HCL 1000 MG PO TABS: ORAL_TABLET | ORAL | 0 refills | Status: DC

## 2020-10-12 MED ORDER — TRIAMTERENE-HCTZ 37.5-25 MG PO TABS: 1.0000 | ORAL_TABLET | Freq: Every day | ORAL | 0 refills | Status: DC

## 2020-10-12 MED ORDER — GLIMEPIRIDE 2 MG PO TABS: ORAL_TABLET | ORAL | 0 refills | Status: DC

## 2020-10-12 MED ORDER — ALLOPURINOL 100 MG PO TABS: ORAL_TABLET | ORAL | 0 refills | Status: DC

## 2020-10-18 DIAGNOSIS — C4441 Basal cell carcinoma of skin of scalp and neck: Secondary | ICD-10-CM | POA: Diagnosis not present

## 2020-10-19 ENCOUNTER — Ambulatory Visit (INDEPENDENT_AMBULATORY_CARE_PROVIDER_SITE_OTHER): Payer: BC Managed Care – PPO | Admitting: Psychology

## 2020-10-19 DIAGNOSIS — F4321 Adjustment disorder with depressed mood: Secondary | ICD-10-CM

## 2020-11-10 ENCOUNTER — Ambulatory Visit (INDEPENDENT_AMBULATORY_CARE_PROVIDER_SITE_OTHER): Payer: BC Managed Care – PPO | Admitting: Psychology

## 2020-11-10 DIAGNOSIS — F4321 Adjustment disorder with depressed mood: Secondary | ICD-10-CM | POA: Diagnosis not present

## 2020-11-23 ENCOUNTER — Ambulatory Visit (INDEPENDENT_AMBULATORY_CARE_PROVIDER_SITE_OTHER): Payer: BC Managed Care – PPO | Admitting: Psychology

## 2020-11-23 DIAGNOSIS — F4321 Adjustment disorder with depressed mood: Secondary | ICD-10-CM

## 2020-11-28 ENCOUNTER — Encounter: Payer: Self-pay | Admitting: Family Medicine

## 2020-11-28 ENCOUNTER — Other Ambulatory Visit: Payer: Self-pay | Admitting: Family Medicine

## 2020-11-28 DIAGNOSIS — M109 Gout, unspecified: Secondary | ICD-10-CM

## 2020-11-28 DIAGNOSIS — R350 Frequency of micturition: Secondary | ICD-10-CM

## 2020-11-28 DIAGNOSIS — I10 Essential (primary) hypertension: Secondary | ICD-10-CM

## 2020-11-28 DIAGNOSIS — E119 Type 2 diabetes mellitus without complications: Secondary | ICD-10-CM

## 2020-11-28 DIAGNOSIS — E785 Hyperlipidemia, unspecified: Secondary | ICD-10-CM

## 2020-11-28 DIAGNOSIS — R7989 Other specified abnormal findings of blood chemistry: Secondary | ICD-10-CM

## 2020-11-29 NOTE — Telephone Encounter (Signed)
Patient scheduled for lab appt.

## 2020-12-01 ENCOUNTER — Ambulatory Visit (INDEPENDENT_AMBULATORY_CARE_PROVIDER_SITE_OTHER): Payer: BC Managed Care – PPO | Admitting: Psychology

## 2020-12-01 DIAGNOSIS — F4321 Adjustment disorder with depressed mood: Secondary | ICD-10-CM | POA: Diagnosis not present

## 2020-12-12 ENCOUNTER — Ambulatory Visit (INDEPENDENT_AMBULATORY_CARE_PROVIDER_SITE_OTHER): Payer: BC Managed Care – PPO | Admitting: Psychology

## 2020-12-12 DIAGNOSIS — F4321 Adjustment disorder with depressed mood: Secondary | ICD-10-CM

## 2020-12-18 ENCOUNTER — Other Ambulatory Visit: Payer: Self-pay

## 2020-12-18 ENCOUNTER — Other Ambulatory Visit: Payer: Self-pay | Admitting: Family Medicine

## 2020-12-18 ENCOUNTER — Other Ambulatory Visit (INDEPENDENT_AMBULATORY_CARE_PROVIDER_SITE_OTHER): Payer: BC Managed Care – PPO

## 2020-12-18 DIAGNOSIS — R972 Elevated prostate specific antigen [PSA]: Secondary | ICD-10-CM

## 2020-12-18 DIAGNOSIS — E119 Type 2 diabetes mellitus without complications: Secondary | ICD-10-CM | POA: Diagnosis not present

## 2020-12-18 DIAGNOSIS — I10 Essential (primary) hypertension: Secondary | ICD-10-CM | POA: Diagnosis not present

## 2020-12-18 DIAGNOSIS — M109 Gout, unspecified: Secondary | ICD-10-CM

## 2020-12-18 DIAGNOSIS — R350 Frequency of micturition: Secondary | ICD-10-CM

## 2020-12-18 DIAGNOSIS — R7989 Other specified abnormal findings of blood chemistry: Secondary | ICD-10-CM

## 2020-12-18 DIAGNOSIS — E785 Hyperlipidemia, unspecified: Secondary | ICD-10-CM

## 2020-12-18 LAB — CBC
HCT: 40.8 % (ref 39.0–52.0)
Hemoglobin: 14.1 g/dL (ref 13.0–17.0)
MCHC: 34.6 g/dL (ref 30.0–36.0)
MCV: 87.1 fl (ref 78.0–100.0)
Platelets: 141 10*3/uL — ABNORMAL LOW (ref 150.0–400.0)
RBC: 4.68 Mil/uL (ref 4.22–5.81)
RDW: 13.5 % (ref 11.5–15.5)
WBC: 8.9 10*3/uL (ref 4.0–10.5)

## 2020-12-18 LAB — LIPID PANEL
Cholesterol: 103 mg/dL (ref 0–200)
HDL: 31.2 mg/dL — ABNORMAL LOW (ref >39.00)
NonHDL: 71.4
Total CHOL/HDL Ratio: 3
Triglycerides: 219 mg/dL — ABNORMAL HIGH (ref 0.0–149.0)
VLDL: 43.8 mg/dL — ABNORMAL HIGH (ref 0.0–40.0)

## 2020-12-18 LAB — COMPREHENSIVE METABOLIC PANEL
ALT: 77 U/L — ABNORMAL HIGH (ref 0–53)
AST: 38 U/L — ABNORMAL HIGH (ref 0–37)
Albumin: 4.5 g/dL (ref 3.5–5.2)
Alkaline Phosphatase: 84 U/L (ref 39–117)
BUN: 18 mg/dL (ref 6–23)
CO2: 27 mEq/L (ref 19–32)
Calcium: 10.8 mg/dL — ABNORMAL HIGH (ref 8.4–10.5)
Chloride: 96 mEq/L (ref 96–112)
Creatinine, Ser: 1.29 mg/dL (ref 0.40–1.50)
GFR: 62.34 mL/min (ref >60.00)
Glucose, Bld: 144 mg/dL — ABNORMAL HIGH (ref 70–99)
Potassium: 4.1 mEq/L (ref 3.5–5.1)
Sodium: 133 mEq/L — ABNORMAL LOW (ref 135–145)
Total Bilirubin: 1.6 mg/dL — ABNORMAL HIGH (ref 0.2–1.2)
Total Protein: 6.7 g/dL (ref 6.0–8.3)

## 2020-12-18 LAB — TSH: TSH: 2.75 u[IU]/mL (ref 0.35–4.50)

## 2020-12-18 LAB — EXTRA SPECIMEN

## 2020-12-18 LAB — CALCIUM, IONIZED

## 2020-12-18 LAB — URIC ACID: Uric Acid, Serum: 4.8 mg/dL (ref 4.0–7.8)

## 2020-12-18 LAB — HEMOGLOBIN A1C: Hgb A1c MFr Bld: 7.6 % — ABNORMAL HIGH (ref 4.6–6.5)

## 2020-12-18 LAB — LDL CHOLESTEROL, DIRECT: Direct LDL: 51 mg/dL

## 2020-12-18 LAB — PSA: PSA: 5.1 ng/mL — ABNORMAL HIGH (ref 0.10–4.00)

## 2020-12-18 LAB — VITAMIN D 25 HYDROXY (VIT D DEFICIENCY, FRACTURES): VITD: 27.88 ng/mL — ABNORMAL LOW (ref 30.00–100.00)

## 2020-12-19 ENCOUNTER — Other Ambulatory Visit: Payer: Self-pay | Admitting: Family Medicine

## 2020-12-19 DIAGNOSIS — M8580 Other specified disorders of bone density and structure, unspecified site: Secondary | ICD-10-CM

## 2020-12-19 DIAGNOSIS — R7989 Other specified abnormal findings of blood chemistry: Secondary | ICD-10-CM

## 2020-12-19 DIAGNOSIS — R0789 Other chest pain: Secondary | ICD-10-CM

## 2020-12-19 DIAGNOSIS — E213 Hyperparathyroidism, unspecified: Secondary | ICD-10-CM

## 2020-12-19 LAB — PARATHYROID HORMONE, INTACT (NO CA): PTH: 88 pg/mL — ABNORMAL HIGH (ref 16–77)

## 2020-12-23 ENCOUNTER — Other Ambulatory Visit: Payer: Self-pay | Admitting: Family Medicine

## 2020-12-26 ENCOUNTER — Ambulatory Visit: Payer: BC Managed Care – PPO | Admitting: Family Medicine

## 2020-12-26 ENCOUNTER — Other Ambulatory Visit: Payer: Self-pay

## 2020-12-26 ENCOUNTER — Encounter: Payer: Self-pay | Admitting: Family Medicine

## 2020-12-26 VITALS — BP 114/72 | HR 57 | Temp 97.6°F | Resp 16 | Wt 223.4 lb

## 2020-12-26 DIAGNOSIS — E785 Hyperlipidemia, unspecified: Secondary | ICD-10-CM

## 2020-12-26 DIAGNOSIS — I10 Essential (primary) hypertension: Secondary | ICD-10-CM

## 2020-12-26 DIAGNOSIS — C61 Malignant neoplasm of prostate: Secondary | ICD-10-CM | POA: Insufficient documentation

## 2020-12-26 DIAGNOSIS — F419 Anxiety disorder, unspecified: Secondary | ICD-10-CM

## 2020-12-26 DIAGNOSIS — R7989 Other specified abnormal findings of blood chemistry: Secondary | ICD-10-CM

## 2020-12-26 DIAGNOSIS — E213 Hyperparathyroidism, unspecified: Secondary | ICD-10-CM

## 2020-12-26 DIAGNOSIS — K760 Fatty (change of) liver, not elsewhere classified: Secondary | ICD-10-CM

## 2020-12-26 DIAGNOSIS — N289 Disorder of kidney and ureter, unspecified: Secondary | ICD-10-CM

## 2020-12-26 DIAGNOSIS — E119 Type 2 diabetes mellitus without complications: Secondary | ICD-10-CM

## 2020-12-26 DIAGNOSIS — R972 Elevated prostate specific antigen [PSA]: Secondary | ICD-10-CM

## 2020-12-26 DIAGNOSIS — F32A Depression, unspecified: Secondary | ICD-10-CM

## 2020-12-26 HISTORY — DX: Malignant neoplasm of prostate: C61

## 2020-12-26 MED ORDER — LOSARTAN POTASSIUM 100 MG PO TABS: 100.0000 mg | ORAL_TABLET | Freq: Every day | ORAL | 1 refills | Status: DC

## 2020-12-26 MED ORDER — TRIAMTERENE-HCTZ 37.5-25 MG PO TABS: 1.0000 | ORAL_TABLET | Freq: Every day | ORAL | 1 refills | Status: DC

## 2020-12-26 MED ORDER — AMLODIPINE BESYLATE 2.5 MG PO TABS
2.5000 mg | ORAL_TABLET | Freq: Every day | ORAL | 1 refills | Status: DC
Start: 2020-12-26 — End: 2021-01-23

## 2020-12-26 MED ORDER — METFORMIN HCL 1000 MG PO TABS: ORAL_TABLET | ORAL | 1 refills | Status: DC

## 2020-12-26 MED ORDER — ATORVASTATIN CALCIUM 20 MG PO TABS: ORAL_TABLET | ORAL | 1 refills | Status: DC

## 2020-12-26 MED ORDER — GLIMEPIRIDE 2 MG PO TABS: ORAL_TABLET | ORAL | 1 refills | Status: DC

## 2020-12-26 MED ORDER — ALLOPURINOL 100 MG PO TABS: 100.0000 mg | ORAL_TABLET | Freq: Every day | ORAL | 1 refills | Status: DC

## 2020-12-26 NOTE — Progress Notes (Signed)
Patient ID: FREDRICO BEEDLE, male    DOB: 03-05-1966  Age: 55 y.o. MRN: [id number]    Subjective:  Subjective  HPI Steve Beasley presents for office visit today. He states that he feels great, but expresses concern regarding his elevated PSA numbers. He states that he had a comprehensive stress exam and had a cardiac calcium scan that he scored a 0 on. He expresses disappointment in his recent A1C levels because he has been already working on fixing his diet. He states that his A1C levels are elevated in the morning when he works out ranging from 100-150 A1C. He states that he increased his frequency of exercise. He states that he has cut down drinks to 3-4 times a week and he made that change about 2 months ago. He denies any major sicknesses, chest pain, SOB, fever, abdominal pain, cough, chills, sore throat, dysuria, urinary incontinence, back pain, HA, or N/VD. He reports taking vitamin D 5000 units twice a week. He states that he has been compliment with his medication. He states experiencing stress and seeing a councilor recently due to life stresses like his wife's recent dx of MS. He states the Counseling has been helping. He states that he has a fungal infection in her right foot.   Review of Systems  Constitutional: Negative for chills, fatigue and fever.  HENT: Negative for congestion, rhinorrhea, sinus pressure and sore throat.   Respiratory: Negative for shortness of breath.   Cardiovascular: Negative for chest pain, palpitations and leg swelling.  Gastrointestinal: Negative for abdominal pain, blood in stool, diarrhea, nausea and vomiting.  Genitourinary: Negative for flank pain, frequency and penile pain.  Musculoskeletal: Negative for back pain and myalgias.  Neurological: Negative for headaches.  Psychiatric/Behavioral: The patient is nervous/anxious.     History Past Medical History:  Diagnosis Date  . Allergy-induced asthma   . Anxiety state, unspecified 09/19/2013  .  Atypical chest pain 11/13/2014  . Basal cell carcinoma of scalp 09/09/2016   and back  . Diabetes mellitus without complication ([place])   . Fatty liver disease, nonalcoholic 22/10/5425  . Gout 09/18/2014  . Hypertension   . Melanoma ([place])    left upper arm  . Nicotine use disorder 06/06/2013   smokeless  . Obesity, unspecified 06/06/2013  . Osteopenia   . Other and unspecified hyperlipidemia 01/19/2014  . Preventative health care 09/19/2013  . Right heart enlargement 09/18/2014  . Testicular cancer ([place])   . Tinnitus 03/24/2017  . Valvular heart disease 09/18/2014    He has a past surgical history that includes testicular cancer surgery; Excision basal cell carcinoma; Skin cancer excision; Colonoscopy; and Polypectomy.   His family history includes ADD / ADHD in his daughter; Anxiety disorder in his daughter and maternal aunt; Bone cancer in his mother; Breast cancer in his mother; Barb Merino in his daughter; Cancer in his father and mother; Colon polyps in his father; Depression in his mother and son; Diabetes in his mother; Gallstones in his brother; Hyperlipidemia in his father; Hypertension in his brother and father; Prostate cancer in his father and maternal grandfather; Stroke in his paternal grandfather; Stroke (age of onset: 67) in his maternal uncle.He reports that he has quit smoking. His smoking use included cigarettes. He quit smokeless tobacco use about 6 years ago. He reports current alcohol use. He reports that he does not use drugs.  Current Outpatient Medications on File Prior to Visit  Medication Sig Dispense Refill  . Cholecalciferol (VITAMIN D3) 5000 UNITS CAPS  Take 1 capsule by mouth daily.    Glory Rosebush ULTRA test strip USE TO CHECK BLOOD SUGAR EVERY DAY AS DIRECTED 100 strip 1   No current facility-administered medications on file prior to visit.     Objective:  Objective  Physical Exam Constitutional:      General: He is not in acute distress.    Appearance: Normal  appearance. He is not ill-appearing or toxic-appearing.  HENT:     Head: Normocephalic and atraumatic.     Right Ear: Tympanic membrane, ear canal and external ear normal.     Left Ear: Tympanic membrane, ear canal and external ear normal.     Nose: No congestion or rhinorrhea.  Eyes:     Extraocular Movements: Extraocular movements intact.     Pupils: Pupils are equal, round, and reactive to light.  Cardiovascular:     Rate and Rhythm: Normal rate and regular rhythm.     Pulses: Normal pulses.     Heart sounds: Normal heart sounds. No murmur heard.   Pulmonary:     Effort: Pulmonary effort is normal. No respiratory distress.     Breath sounds: Normal breath sounds. No wheezing, rhonchi or rales.  Abdominal:     General: Bowel sounds are normal.     Palpations: Abdomen is soft. There is no mass.     Tenderness: There is no abdominal tenderness. There is no guarding.     Hernia: No hernia is present.  Musculoskeletal:        General: Normal range of motion.     Cervical back: Normal range of motion and neck supple.  Feet:     Right foot:     Protective Sensation: 7 sites tested. 10 sites sensed.     Skin integrity: Skin integrity normal. No dry skin.     Left foot:     Protective Sensation: 7 sites tested. 10 sites sensed.     Skin integrity: Skin integrity normal. No dry skin.  Skin:    General: Skin is warm and dry.  Neurological:     Mental Status: He is alert and oriented to person, place, and time.     Sensory: No sensory deficit.  Psychiatric:        Behavior: Behavior normal.    BP 114/72   Pulse (!) 57   Temp 97.6 F (36.4 C)   Resp 16   Wt 223 lb 6.4 oz (101.3 kg)   SpO2 96%   BMI 31.16 kg/m  Wt Readings from Last 3 Encounters:  12/26/20 223 lb 6.4 oz (101.3 kg)  06/10/19 222 lb (100.7 kg)  05/09/19 216 lb (98 kg)     Lab Results  Component Value Date   WBC 8.9 12/18/2020   HGB 14.1 12/18/2020   HCT 40.8 12/18/2020   PLT 141.0 (L) 12/18/2020    GLUCOSE 144 (H) 12/18/2020   CHOL 103 12/18/2020   TRIG 219.0 (H) 12/18/2020   HDL 31.20 (L) 12/18/2020   LDLDIRECT 51.0 12/18/2020   LDLCALC 46 11/23/2019   ALT 77 (H) 12/18/2020   AST 38 (H) 12/18/2020   NA 133 (L) 12/18/2020   K 4.1 12/18/2020   CL 96 12/18/2020   CREATININE 1.29 12/18/2020   BUN 18 12/18/2020   CO2 27 12/18/2020   TSH 2.75 12/18/2020   PSA 5.10 (H) 12/18/2020   HGBA1C 7.6 (H) 12/18/2020   MICROALBUR 1.3 08/21/2015    2D Echocardiogram with contrast  Result Date: 08/03/2014                       *  [facility]                        [place], [place] [address]                            [phone] ------------------------------------------------------------------- Transthoracic Echocardiography Patient:    Child, Campoy MR #:       [id number] Study Date: 08/03/2014 Gender:     M Age:        22 Height:     180.3 cm Weight:     104.8 kg BSA:        2.32 m^2 Pt. Status: Room:  SONOGRAPHER  Tresa Res, RDCS  ORDERING     Penni Homans A  REFERRING    Penni Homans A  ATTENDING    [facility], [place]  PERFORMING   [facility], Outpatient cc: ------------------------------------------------------------------- LV EF: 60% -   65% ------------------------------------------------------------------- Indications:      Dyspnea 786.09. ------------------------------------------------------------------- History:   Risk factors:  Hypertension. Diabetes mellitus. ------------------------------------------------------------------- Study Conclusions - Left ventricle: The cavity size was normal. Wall thickness was   normal. Systolic function was normal. The estimated ejection   fraction was in the range of 60% to 65%. Wall motion was normal;   there were no regional wall motion abnormalities. - Right ventricle: The cavity size was mildly dilated. - Right atrium: The atrium was mildly dilated. Transthoracic echocardiography.  M-mode, complete 2D, spectral  Doppler, and color Doppler.  Birthdate:  Patient birthdate: June 07, 1966.  Age:  Patient is 55 yr old.  Sex:  Gender: male. BMI: 32.2 kg/m^2.  Blood pressure:     132/72  Patient status: Outpatient.  Study date:  Study date: 08/03/2014. Study time: 09:06 AM.  [facility]:  [facility]. ------------------------------------------------------------------- ------------------------------------------------------------------- Left ventricle:  The cavity size was normal. Wall thickness was normal. Systolic function was normal. The estimated ejection fraction was in the range of 60% to 65%. Wall motion was normal; there were no regional wall motion abnormalities. ------------------------------------------------------------------- Aortic valve:   Structurally normal valve.   Cusp separation was normal.  Doppler:  Transvalvular velocity was within the normal range. There was no stenosis. There was no regurgitation. ------------------------------------------------------------------- Aorta:  Aortic root: The aortic root was normal in size. Ascending aorta: The ascending aorta was normal in size. ------------------------------------------------------------------- Mitral valve:   Structurally normal valve.   Leaflet separation was normal.  Doppler:  Transvalvular velocity was within the normal range. There was no evidence for stenosis. There was no regurgitation. ------------------------------------------------------------------- Left atrium:  The atrium was normal in size. ------------------------------------------------------------------- Right ventricle:  The cavity size was mildly dilated. Systolic function was normal. ------------------------------------------------------------------- Pulmonic valve:    Structurally normal valve.   Cusp separation was normal.  Doppler:  Transvalvular velocity was within the normal range. There was trivial regurgitation. -------------------------------------------------------------------  Tricuspid valve:   Structurally normal valve.   Leaflet separation was normal.  Doppler:  Transvalvular velocity was within the normal range. There was trivial regurgitation. ------------------------------------------------------------------- Pulmonary artery:   Systolic pressure was within the normal range.  ------------------------------------------------------------------- Right atrium:  The atrium was mildly dilated. ------------------------------------------------------------------- Pericardium:  There was no pericardial effusion. ------------------------------------------------------------------- Measurements  Left ventricle  Value        Reference  LV ID, ED, PLAX chordal                  51.2  mm     43 - 52  LV ID, ES, PLAX chordal                  31.6  mm     23 - 38  LV fx shortening, PLAX chordal           38    %      >=29  LV PW thickness, ED                      10.7  mm     ---------  IVS/LV PW ratio, ED                      1.21         <=1.3  LV ejection fraction, 1-p A4C            71    %      ---------  LV end-diastolic volume, 2-p             78    ml     ---------  LV end-systolic volume, 2-p              24    ml     ---------  LV ejection fraction, 2-p                69    %      ---------  Stroke volume, 2-p                       54    ml     ---------  LV end-diastolic volume/bsa, 2-p         34    ml/m^2 ---------  LV end-systolic volume/bsa, 2-p          10    ml/m^2 ---------  Stroke volume/bsa, 2-p                   23.3  ml/m^2 ---------  LV e&', lateral                           9.26  cm/s   ---------  LV E/e&', lateral                         5.81         ---------  LV e&', medial                            6.43  cm/s   ---------  LV E/e&', medial                          8.37         ---------  LV e&', average                           7.85  cm/s   ---------  LV E/e&', average  6.86         ---------   Ventricular septum                        Value        Reference  IVS thickness, ED                        12.9  mm     ---------   Aorta                                    Value        Reference  Aortic root ID, ED                       29    mm     ---------   Left atrium                              Value        Reference  LA ID, A-P, ES                           32    mm     ---------  LA ID/bsa, A-P                           1.38  cm/m^2 <=2.2  LA volume, ES, 1-p A4C                   27    ml     ---------  LA volume/bsa, ES, 1-p A4C               11.6  ml/m^2 ---------  LA volume, ES, 1-p A2C                   38    ml     ---------  LA volume/bsa, ES, 1-p A2C               16.4  ml/m^2 ---------   Mitral valve                             Value        Reference  Mitral E-wave peak velocity              53.8  cm/s   ---------  Mitral A-wave peak velocity              46.4  cm/s   ---------  Mitral deceleration time         (H)     296   ms     150 - 230  Mitral E/A ratio, peak                   1.2          ---------   Pulmonary arteries                       Value        Reference  PA pressure, S, DP  19    mm Hg  <=30   Tricuspid valve                          Value        Reference  Tricuspid regurg peak velocity           200   cm/s   ---------  Tricuspid peak RV-RA gradient            16    mm Hg  ---------   Systemic veins                           Value        Reference  Estimated CVP                            3     mm Hg  ---------   Right ventricle                          Value        Reference  RV pressure, S, DP                       19    mm Hg  <=30 Legend: (L)  and  (H)  mark values outside specified reference range. ------------------------------------------------------------------- Prepared and Electronically Authenticated by Mertie Moores, M.D. 2015-11-04T11:03:19    Assessment & Plan:  Plan    Meds ordered this encounter  Medications  . allopurinol (ZYLOPRIM) 100 MG tablet    Sig: Take 1  tablet (100 mg total) by mouth daily. TAKE 1 TABLET(100 MG) BY MOUTH TWICE DAILY    Dispense:  90 tablet    Refill:  1  . amLODipine (NORVASC) 2.5 MG tablet    Sig: Take 1 tablet (2.5 mg total) by mouth daily.    Dispense:  90 tablet    Refill:  1    ZERO refills remain on this prescription. Your patient is requesting advance approval of refills for this medication to [facility]  . atorvastatin (LIPITOR) 20 MG tablet    Sig: TAKE 1 TABLET(20 MG) BY MOUTH DAILY    Dispense:  90 tablet    Refill:  1  . glimepiride (AMARYL) 2 MG tablet    Sig: Take 1 tablet twice daily with a meal.    Dispense:  180 tablet    Refill:  1  . losartan (COZAAR) 100 MG tablet    Sig: Take 1 tablet (100 mg total) by mouth daily.    Dispense:  90 tablet    Refill:  1  . metFORMIN (GLUCOPHAGE) 1000 MG tablet    Sig: TAKE 1 TABLET(1000 MG) BY MOUTH TWICE DAILY WITH A MEAL    Dispense:  180 tablet    Refill:  1  . triamterene-hydrochlorothiazide (MAXZIDE-25) 37.5-25 MG tablet    Sig: Take 1 tablet by mouth daily.    Dispense:  90 tablet    Refill:  1    Problem List Items Addressed This Visit    DM (diabetes mellitus), type 2 ([place]) - Primary   Relevant Medications   atorvastatin (LIPITOR) 20 MG tablet   glimepiride (AMARYL) 2 MG tablet   losartan (COZAAR) 100 MG tablet   metFORMIN (GLUCOPHAGE) 1000 MG tablet   Other Relevant  Orders   Hemoglobin A1c   HTN (hypertension)    Well controlled, no changes to meds. Encouraged heart healthy diet such as the DASH diet and exercise as tolerated.       Relevant Medications   amLODipine (NORVASC) 2.5 MG tablet   atorvastatin (LIPITOR) 20 MG tablet   losartan (COZAAR) 100 MG tablet   triamterene-hydrochlorothiazide (MAXZIDE-25) 37.5-25 MG tablet   Other Relevant Orders   CBC   Comprehensive metabolic panel   TSH   Anxiety and depression    He has tried some counseling over the past 3 months. Mother and dog died this past year. His wife was  diagnosed with MS this year. And new job in past 18 months      Fatty liver disease, nonalcoholic    Liver markers up slightly still but not worsening. Continue to minimize carbs and increase exercise      Hyperlipidemia    Tolerating statin, encouraged heart healthy diet, avoid trans fats, minimize simple carbs and saturated fats. Increase exercise as tolerated      Relevant Medications   amLODipine (NORVASC) 2.5 MG tablet   atorvastatin (LIPITOR) 20 MG tablet   losartan (COZAAR) 100 MG tablet   triamterene-hydrochlorothiazide (MAXZIDE-25) 37.5-25 MG tablet   Other Relevant Orders   Lipid panel   Renal insufficiency    Hydrate and monitor      Low vitamin D level    Has been taking 5000 IU roughly twice a week. Will increase to 3 x a week and change to Vitamin D 2000 IU daily       Relevant Orders   VITAMIN D 25 Hydroxy (Vit-D Deficiency, Fractures)   Hyperparathyroidism ([place])    Referred to endocrinology      Relevant Orders   PTH, Intact and Calcium   Elevated PSA    Referred to urology         Follow-up: Return in about 3 months (around 03/26/2021), or lab appt 6/27 then a f/u visit after that.  I,Herberth Deharo,acting as a Education administrator for Penni Homans, MD.,have documented all relevant documentation on the behalf of Penni Homans, MD,as directed by  Penni Homans, MD while in the presence of Penni Homans, MD.  I, Mosie Lukes, MD personally performed the services described in this documentation. All medical record entries made by the scribe were at my direction and in my presence. I have reviewed the chart and agree that the record reflects my personal performance and is accurate and complete

## 2020-12-26 NOTE — Progress Notes (Incomplete)
Patient ID: Steve Beasley, male    DOB: 1965-10-04  Age: 55 y.o. MRN: [id number]    Subjective:  Subjective  HPI Steve Beasley presents for office visit today. Steve Beasley states that Steve Beasley feels great, but expresses concern regarding his elevated PSA numbers. Steve Beasley states that Steve Beasley had a comprehensive stress exam and had a cardiac calcium scan that Steve Beasley scored a 0 on. Steve Beasley expresses disappointment in his recent A1C levels because Steve Beasley has been already working on fixing his diet. Steve Beasley states that his A1C levels are elevated in the morning when Steve Beasley works out ranging from 100-150 A1C. Steve Beasley states that Steve Beasley increased his frequency of exercise. Steve Beasley states that Steve Beasley has cut down drinks to 3-4 times a week and Steve Beasley made that change about 2 months ago. Steve Beasley denies any major sicknesses, chest pain, SOB, fever, abdominal pain, cough, chills, sore throat, dysuria, urinary incontinence, back pain, HA, or N/VD. Steve Beasley reports taking vitamin D 5000 units twice a week. Steve Beasley states that Steve Beasley has been compliment with his medication. Steve Beasley states experiencing stress and seeing a councilor recently due to life stresses like his wife's recent dx of MS. Steve Beasley states the Counseling has been helping. Steve Beasley states that Steve Beasley has a fungal infection in her right foot.  Review of Systems  Constitutional: Negative for chills, fatigue and fever.  HENT: Negative for congestion, rhinorrhea, sinus pressure and sore throat.   Respiratory: Negative for shortness of breath.   Cardiovascular: Negative for chest pain, palpitations and leg swelling.  Gastrointestinal: Negative for abdominal pain, blood in stool, diarrhea, nausea and vomiting.  Genitourinary: Negative for flank pain, frequency and penile pain.  Musculoskeletal: Negative for back pain and myalgias.  Neurological: Negative for headaches.  Psychiatric/Behavioral: The patient is nervous/anxious.     History Past Medical History:  Diagnosis Date  . Allergy-induced asthma   . Anxiety state, unspecified 09/19/2013  .  Atypical chest pain 11/13/2014  . Basal cell carcinoma of scalp 09/09/2016   and back  . Diabetes mellitus without complication ([name])   . Fatty liver disease, nonalcoholic 37/06/6268  . Gout 09/18/2014  . Hypertension   . Melanoma ([place])    left upper arm  . Nicotine use disorder 06/06/2013   smokeless  . Obesity, unspecified 06/06/2013  . Osteopenia   . Other and unspecified hyperlipidemia 01/19/2014  . Preventative health care 09/19/2013  . Right heart enlargement 09/18/2014  . Testicular cancer ([place])   . Tinnitus 03/24/2017  . Valvular heart disease 09/18/2014    Steve Beasley has a past surgical history that includes testicular cancer surgery; Excision basal cell carcinoma; Skin cancer excision; Colonoscopy; and Polypectomy.   His family history includes ADD / ADHD in his daughter; Anxiety disorder in his daughter and maternal aunt; Bone cancer in his mother; Breast cancer in his mother; Barb Merino in his daughter; Cancer in his father and mother; Colon polyps in his father; Depression in his mother and son; Diabetes in his mother; Gallstones in his brother; Hyperlipidemia in his father; Hypertension in his brother and father; Prostate cancer in his father and maternal grandfather; Stroke in his paternal grandfather; Stroke (age of onset: 55) in his maternal uncle.Steve Beasley reports that Steve Beasley has quit smoking. His smoking use included cigarettes. Steve Beasley quit smokeless tobacco use about 6 years ago. Steve Beasley reports current alcohol use. Steve Beasley reports that Steve Beasley does not use drugs.  Current Outpatient Medications on File Prior to Visit  Medication Sig Dispense Refill  . Cholecalciferol (VITAMIN D3) 5000 UNITS CAPS Take  1 capsule by mouth daily.    Steve Beasley ULTRA test strip USE TO CHECK BLOOD SUGAR EVERY DAY AS DIRECTED 100 strip 1   No current facility-administered medications on file prior to visit.     Objective:  Objective  Physical Exam Vitals and nursing note reviewed.  Constitutional:      General: Steve Beasley is not in  acute distress.    Appearance: Normal appearance. Steve Beasley is not ill-appearing.  HENT:     Head: Normocephalic and atraumatic.     Right Ear: Tympanic membrane, ear canal and external ear normal.     Left Ear: Tympanic membrane, ear canal and external ear normal.  Eyes:     Extraocular Movements: Extraocular movements intact.     Pupils: Pupils are equal, round, and reactive to light.  Cardiovascular:     Rate and Rhythm: Normal rate and regular rhythm.     Pulses: Normal pulses.     Heart sounds: Normal heart sounds. No murmur heard.   Pulmonary:     Effort: Pulmonary effort is normal. No respiratory distress.     Breath sounds: Normal breath sounds. No wheezing, rhonchi or rales.  Abdominal:     General: Bowel sounds are normal.     Palpations: Abdomen is soft. There is no mass.     Tenderness: There is no abdominal tenderness. There is no guarding.  Musculoskeletal:        General: Normal range of motion.     Cervical back: Normal range of motion and neck supple.  Feet:     Right foot:     Protective Sensation: 7 sites tested. 10 sites sensed.     Skin integrity: Skin integrity normal. No dry skin.     Left foot:     Protective Sensation: 7 sites tested. 10 sites sensed.     Skin integrity: Skin integrity normal. No dry skin.  Skin:    General: Skin is warm and dry.  Neurological:     Mental Status: Steve Beasley is oriented to person, place, and time.     Sensory: No sensory deficit.  Psychiatric:        Behavior: Behavior normal.    BP 114/72   Pulse (!) 57   Temp 97.6 F (36.4 C)   Resp 16   Wt 223 lb 6.4 oz (101.3 kg)   SpO2 96%   BMI 31.16 kg/m  Wt Readings from Last 3 Encounters:  12/26/20 223 lb 6.4 oz (101.3 kg)  06/10/19 222 lb (100.7 kg)  05/09/19 216 lb (98 kg)     Lab Results  Component Value Date   WBC 8.9 12/18/2020   HGB 14.1 12/18/2020   HCT 40.8 12/18/2020   PLT 141.0 (L) 12/18/2020   GLUCOSE 144 (H) 12/18/2020   CHOL 103 12/18/2020   TRIG 219.0  (H) 12/18/2020   HDL 31.20 (L) 12/18/2020   LDLDIRECT 51.0 12/18/2020   LDLCALC 46 11/23/2019   ALT 77 (H) 12/18/2020   AST 38 (H) 12/18/2020   NA 133 (L) 12/18/2020   K 4.1 12/18/2020   CL 96 12/18/2020   CREATININE 1.29 12/18/2020   BUN 18 12/18/2020   CO2 27 12/18/2020   TSH 2.75 12/18/2020   PSA 5.10 (H) 12/18/2020   HGBA1C 7.6 (H) 12/18/2020   MICROALBUR 1.3 08/21/2015    2D Echocardiogram with contrast  Result Date: 08/03/2014                       *  [facility]                        [place],  [address]                            [phone] ------------------------------------------------------------------- Transthoracic Echocardiography Patient:    Steve Beasley, Steve Beasley MR #:       [id number] Study Date: 08/03/2014 Gender:     M Age:        81 Height:     180.3 cm Weight:     104.8 kg BSA:        2.32 m^2 Pt. Status: Room:  SONOGRAPHER  Tresa Res, RDCS  ORDERING     Penni Homans A  REFERRING    Penni Homans A  ATTENDING    [facility], [place]  PERFORMING   [facility], Outpatient cc: ------------------------------------------------------------------- LV EF: 60% -   65% ------------------------------------------------------------------- Indications:      Dyspnea 786.09. ------------------------------------------------------------------- History:   Risk factors:  Hypertension. Diabetes mellitus. ------------------------------------------------------------------- Study Conclusions - Left ventricle: The cavity size was normal. Wall thickness was   normal. Systolic function was normal. The estimated ejection   fraction was in the range of 60% to 65%. Wall motion was normal;   there were no regional wall motion abnormalities. - Right ventricle: The cavity size was mildly dilated. - Right atrium: The atrium was mildly dilated. Transthoracic echocardiography.  M-mode, complete 2D, spectral Doppler, and color Doppler.  Birthdate:  Patient birthdate:  15-Oct-1965.  Age:  Patient is 55 yr old.  Sex:  Gender: male. BMI: 32.2 kg/m^2.  Blood pressure:     132/72  Patient status: Outpatient.  Study date:  Study date: 08/03/2014. Study time: 09:06 AM.  Location:  [facility]. ------------------------------------------------------------------- ------------------------------------------------------------------- Left ventricle:  The cavity size was normal. Wall thickness was normal. Systolic function was normal. The estimated ejection fraction was in the range of 60% to 65%. Wall motion was normal; there were no regional wall motion abnormalities. ------------------------------------------------------------------- Aortic valve:   Structurally normal valve.   Cusp separation was normal.  Doppler:  Transvalvular velocity was within the normal range. There was no stenosis. There was no regurgitation. ------------------------------------------------------------------- Aorta:  Aortic root: The aortic root was normal in size. Ascending aorta: The ascending aorta was normal in size. ------------------------------------------------------------------- Mitral valve:   Structurally normal valve.   Leaflet separation was normal.  Doppler:  Transvalvular velocity was within the normal range. There was no evidence for stenosis. There was no regurgitation. ------------------------------------------------------------------- Left atrium:  The atrium was normal in size. ------------------------------------------------------------------- Right ventricle:  The cavity size was mildly dilated. Systolic function was normal. ------------------------------------------------------------------- Pulmonic valve:    Structurally normal valve.   Cusp separation was normal.  Doppler:  Transvalvular velocity was within the normal range. There was trivial regurgitation. ------------------------------------------------------------------- Tricuspid valve:   Structurally normal valve.   Leaflet separation  was normal.  Doppler:  Transvalvular velocity was within the normal range. There was trivial regurgitation. ------------------------------------------------------------------- Pulmonary artery:   Systolic pressure was within the normal range.  ------------------------------------------------------------------- Right atrium:  The atrium was mildly dilated. ------------------------------------------------------------------- Pericardium:  There was no pericardial effusion. ------------------------------------------------------------------- Measurements  Left ventricle  Value        Reference  LV ID, ED, PLAX chordal                  51.2  mm     43 - 52  LV ID, ES, PLAX chordal                  31.6  mm     23 - 38  LV fx shortening, PLAX chordal           38    %      >=29  LV PW thickness, ED                      10.7  mm     ---------  IVS/LV PW ratio, ED                      1.21         <=1.3  LV ejection fraction, 1-p A4C            71    %      ---------  LV end-diastolic volume, 2-p             78    ml     ---------  LV end-systolic volume, 2-p              24    ml     ---------  LV ejection fraction, 2-p                69    %      ---------  Stroke volume, 2-p                       54    ml     ---------  LV end-diastolic volume/bsa, 2-p         34    ml/m^2 ---------  LV end-systolic volume/bsa, 2-p          10    ml/m^2 ---------  Stroke volume/bsa, 2-p                   23.3  ml/m^2 ---------  LV e&', lateral                           9.26  cm/s   ---------  LV E/e&', lateral                         5.81         ---------  LV e&', medial                            6.43  cm/s   ---------  LV E/e&', medial                          8.37         ---------  LV e&', average                           7.85  cm/s   ---------  LV E/e&', average  6.86         ---------   Ventricular septum                       Value        Reference  IVS thickness, ED                         12.9  mm     ---------   Aorta                                    Value        Reference  Aortic root ID, ED                       29    mm     ---------   Left atrium                              Value        Reference  LA ID, A-P, ES                           32    mm     ---------  LA ID/bsa, A-P                           1.38  cm/m^2 <=2.2  LA volume, ES, 1-p A4C                   27    ml     ---------  LA volume/bsa, ES, 1-p A4C               11.6  ml/m^2 ---------  LA volume, ES, 1-p A2C                   38    ml     ---------  LA volume/bsa, ES, 1-p A2C               16.4  ml/m^2 ---------   Mitral valve                             Value        Reference  Mitral E-wave peak velocity              53.8  cm/s   ---------  Mitral A-wave peak velocity              46.4  cm/s   ---------  Mitral deceleration time         (H)     296   ms     150 - 230  Mitral E/A ratio, peak                   1.2          ---------   Pulmonary arteries                       Value        Reference  PA pressure, S, DP  19    mm Hg  <=30   Tricuspid valve                          Value        Reference  Tricuspid regurg peak velocity           200   cm/s   ---------  Tricuspid peak RV-RA gradient            16    mm Hg  ---------   Systemic veins                           Value        Reference  Estimated CVP                            3     mm Hg  ---------   Right ventricle                          Value        Reference  RV pressure, S, DP                       19    mm Hg  <=30 Legend: (L)  and  (H)  mark values outside specified reference range. ------------------------------------------------------------------- Prepared and Electronically Authenticated by Mertie Moores, M.D. 2015-11-04T11:03:19    Assessment & Plan:  Plan    Meds ordered this encounter  Medications  . allopurinol (ZYLOPRIM) 100 MG tablet    Sig: Take 1 tablet (100 mg total) by mouth daily. TAKE 1 TABLET(100 MG) BY MOUTH  TWICE DAILY    Dispense:  90 tablet    Refill:  1  . amLODipine (NORVASC) 2.5 MG tablet    Sig: Take 1 tablet (2.5 mg total) by mouth daily.    Dispense:  90 tablet    Refill:  1    ZERO refills remain on this prescription. Your patient is requesting advance approval of refills for this medication to [facility]  . atorvastatin (LIPITOR) 20 MG tablet    Sig: TAKE 1 TABLET(20 MG) BY MOUTH DAILY    Dispense:  90 tablet    Refill:  1  . glimepiride (AMARYL) 2 MG tablet    Sig: Take 1 tablet twice daily with a meal.    Dispense:  180 tablet    Refill:  1  . losartan (COZAAR) 100 MG tablet    Sig: Take 1 tablet (100 mg total) by mouth daily.    Dispense:  90 tablet    Refill:  1  . metFORMIN (GLUCOPHAGE) 1000 MG tablet    Sig: TAKE 1 TABLET(1000 MG) BY MOUTH TWICE DAILY WITH A MEAL    Dispense:  180 tablet    Refill:  1  . triamterene-hydrochlorothiazide (MAXZIDE-25) 37.5-25 MG tablet    Sig: Take 1 tablet by mouth daily.    Dispense:  90 tablet    Refill:  1    Problem List Items Addressed This Visit      Cardiovascular and Mediastinum   HTN (hypertension)    Well controlled, no changes to meds. Encouraged heart healthy diet such as the DASH diet and exercise as tolerated.       Relevant Medications   amLODipine (NORVASC) 2.5  MG tablet   atorvastatin (LIPITOR) 20 MG tablet   losartan (COZAAR) 100 MG tablet   triamterene-hydrochlorothiazide (MAXZIDE-25) 37.5-25 MG tablet   Other Relevant Orders   CBC   Comprehensive metabolic panel   TSH     Digestive   Fatty liver disease, nonalcoholic    Liver markers up slightly still but not worsening. Continue to minimize carbs and increase exercise        Endocrine   DM (diabetes mellitus), type 2 (HCC) - Primary   Relevant Medications   atorvastatin (LIPITOR) 20 MG tablet   glimepiride (AMARYL) 2 MG tablet   losartan (COZAAR) 100 MG tablet   metFORMIN (GLUCOPHAGE) 1000 MG tablet   Other Relevant Orders    Hemoglobin A1c   Hyperparathyroidism ([place])    Referred to endocrinology      Relevant Orders   PTH, Intact and Calcium     Genitourinary   Renal insufficiency    Hydrate and monitor        Other   Anxiety and depression    Steve Beasley has tried some counseling over the past 3 months. Mother and dog died this past year. His wife was diagnosed with MS this year. And new job in past 18 months      Hyperlipidemia    Tolerating statin, encouraged heart healthy diet, avoid trans fats, minimize simple carbs and saturated fats. Increase exercise as tolerated      Relevant Medications   amLODipine (NORVASC) 2.5 MG tablet   atorvastatin (LIPITOR) 20 MG tablet   losartan (COZAAR) 100 MG tablet   triamterene-hydrochlorothiazide (MAXZIDE-25) 37.5-25 MG tablet   Other Relevant Orders   Lipid panel   Low vitamin D level    Has been taking 5000 IU roughly twice a week. Will increase to 3 x a week and change to Vitamin D 2000 IU daily       Relevant Orders   VITAMIN D 25 Hydroxy (Vit-D Deficiency, Fractures)   Elevated PSA    Referred to urology         Follow-up: Return in about 3 months (around 03/26/2021), or lab appt 6/27 then a f/u visit after that.    I,David Hanna,acting as a scribe for Penni Homans, MD.,have documented all relevant documentation on the behalf of Penni Homans, MD,as directed by  Penni Homans, MD while in the presence of Penni Homans, MD.  ***

## 2020-12-26 NOTE — Patient Instructions (Signed)

## 2020-12-26 NOTE — Assessment & Plan Note (Signed)
Tolerating statin, encouraged heart healthy diet, avoid trans fats, minimize simple carbs and saturated fats. Increase exercise as tolerated 

## 2020-12-26 NOTE — Assessment & Plan Note (Signed)
Hydrate and monitor 

## 2020-12-26 NOTE — Assessment & Plan Note (Signed)
Liver markers up slightly still but not worsening. Continue to minimize carbs and increase exercise

## 2020-12-26 NOTE — Assessment & Plan Note (Signed)
He has tried some counseling over the past 3 months. Mother and dog died this past year. His wife was diagnosed with MS this year. And new job in past 18 months

## 2020-12-26 NOTE — Assessment & Plan Note (Signed)
Referred to urology

## 2020-12-26 NOTE — Assessment & Plan Note (Signed)
Referred to endocrinology

## 2020-12-26 NOTE — Assessment & Plan Note (Signed)
Has been taking 5000 IU roughly twice a week. Will increase to 3 x a week and change to Vitamin D 2000 IU daily

## 2020-12-26 NOTE — Assessment & Plan Note (Signed)
>>  ASSESSMENT AND PLAN FOR HTN (HYPERTENSION) WRITTEN ON 12/26/2020  9:01 AM BY BLYTH, STACEY A, MD  Well controlled, no changes to meds. Encouraged heart healthy diet such as the DASH diet and exercise as tolerated.

## 2020-12-26 NOTE — Assessment & Plan Note (Signed)
Well controlled, no changes to meds. Encouraged heart healthy diet such as the DASH diet and exercise as tolerated.  °

## 2020-12-28 ENCOUNTER — Ambulatory Visit (INDEPENDENT_AMBULATORY_CARE_PROVIDER_SITE_OTHER): Payer: BC Managed Care – PPO | Admitting: Psychology

## 2020-12-28 DIAGNOSIS — F4321 Adjustment disorder with depressed mood: Secondary | ICD-10-CM | POA: Diagnosis not present

## 2021-01-03 DIAGNOSIS — D485 Neoplasm of uncertain behavior of skin: Secondary | ICD-10-CM | POA: Diagnosis not present

## 2021-01-03 DIAGNOSIS — D225 Melanocytic nevi of trunk: Secondary | ICD-10-CM | POA: Diagnosis not present

## 2021-01-10 ENCOUNTER — Ambulatory Visit (INDEPENDENT_AMBULATORY_CARE_PROVIDER_SITE_OTHER): Payer: BC Managed Care – PPO | Admitting: Psychology

## 2021-01-10 DIAGNOSIS — F4321 Adjustment disorder with depressed mood: Secondary | ICD-10-CM

## 2021-01-23 ENCOUNTER — Other Ambulatory Visit: Payer: Self-pay

## 2021-01-23 ENCOUNTER — Ambulatory Visit: Payer: BC Managed Care – PPO | Admitting: Internal Medicine

## 2021-01-23 ENCOUNTER — Encounter: Payer: Self-pay | Admitting: Internal Medicine

## 2021-01-23 DIAGNOSIS — I10 Essential (primary) hypertension: Secondary | ICD-10-CM | POA: Diagnosis not present

## 2021-01-23 MED ORDER — AMLODIPINE BESYLATE 5 MG PO TABS: 5.0000 mg | ORAL_TABLET | Freq: Every day | ORAL | 1 refills | Status: DC

## 2021-01-23 NOTE — Patient Instructions (Addendum)
-   STOP Triamterene- HCTZ   - Increase Amlodipine to 5 mg daily  - Stay hydrated  - Avoid over the counter calcium supplements - maintain 2-3 servings of dietary calcium daily  - Continue Vitamin D 3000 iu daily   - Please wait at least 4 weeks prior to collecting urine from the time you discontinue Triamterene-HCTZ   24-Hour Urine Collection   You will be collecting your urine for a 24-hour period of time.  Your timer starts with your first urine of the morning (For example - If you first pee at [place], your timer will start at [place])  [place] away your first urine of the morning  Collect your urine every time you pee for the next 24 hours STOP your urine collection 24 hours after you started the collection (For example - You would stop at 9AM the day after you started)

## 2021-01-23 NOTE — Progress Notes (Signed)
Name: Steve Beasley  MRN/ DOB: [id number], [birth year]    Age/ Sex: 55 y.o., male    PCP: Mosie Lukes, MD   Reason for Endocrinology Evaluation: Hypercalcemia      Date of Initial Endocrinology Evaluation: 01/23/2021     HPI: Steve Beasley is a 55 y.o. male with a past medical history of T2Dm , Hx of testicular cancer (Seminoma  S/P left orchectomy in 2000 and radiation, but no chemo) and HTN. The patient presented for initial endocrinology clinic visit on 01/23/2021 for consultative assistance with his hypercalcemia   Steve Beasley indicates that he was first noted with intermittent hypercalcemia since 2014 with a max serum calcium of 11.4 mg/dL ( non -corrected) in 2019 and elevated PTH with highest at 88 pg/mL in 2022.   Since that time, he has experienced symptoms of  polyuria, polydipsia, intermittent constipation,denies  generalized weakness, or diffuse muscle pains. He denies  use of over the counter calcium (including supplements, Tums, Rolaids, or other calcium containing antacids), or lithium,   He is on HCTZ He is  vitamin D 3000 iu daily   He denies history of kidney stones,but has chronic  kidney disease, and  fatty liver disease,but no  granulomatous disease. He denies osteoporosis but has spine compression fractures. . Daily dietary calcium intake: 2 servings.  He  family history of osteoporosis, parathyroid disease, has cousin with thyroid cancer.         HISTORY:  Past Medical History:  Past Medical History:  Diagnosis Date  . Allergy-induced asthma   . Anxiety state, unspecified 09/19/2013  . Atypical chest pain 11/13/2014  . Basal cell carcinoma of scalp 09/09/2016   and back  . Diabetes mellitus without complication ([place])   . Fatty liver disease, nonalcoholic 89/38/1017  . Gout 09/18/2014  . Hypertension   . Melanoma ([place])    left upper arm  . Nicotine use disorder 06/06/2013   smokeless  . Obesity, unspecified 06/06/2013  . Osteopenia   .  Other and unspecified hyperlipidemia 01/19/2014  . Preventative health care 09/19/2013  . Right heart enlargement 09/18/2014  . Testicular cancer ([place])   . Tinnitus 03/24/2017  . Valvular heart disease 09/18/2014   Past Surgical History:  Past Surgical History:  Procedure Laterality Date  . BASAL CELL CARCINOMA EXCISION     Within 6 months  . COLONOSCOPY    . POLYPECTOMY    . SKIN CANCER EXCISION     Within 6 months  . testicular cancer surgery        Social History:  reports that he has quit smoking. His smoking use included cigarettes. He quit smokeless tobacco use about 6 years ago. He reports current alcohol use. He reports that he does not use drugs.  Family History: family history includes ADD / ADHD in his daughter; Anxiety disorder in his daughter and maternal aunt; Bone cancer in his mother; Breast cancer in his mother; Barb Merino in his daughter; Cancer in his father and mother; Colon polyps in his father; Depression in his mother and son; Diabetes in his mother; Gallstones in his brother; Hyperlipidemia in his father; Hypertension in his brother and father; Prostate cancer in his father and maternal grandfather; Stroke in his paternal grandfather; Stroke (age of onset: 45) in his maternal uncle.   HOME MEDICATIONS: Allergies as of 01/23/2021   No Known Allergies     Medication List       Accurate as of January 23, 2021  10:38 AM. If you have any questions, ask your nurse or doctor.        STOP taking these medications   triamterene-hydrochlorothiazide 37.5-25 MG tablet Commonly known as: MAXZIDE-25 Stopped by: Dorita Sciara, MD   Vitamin D3 125 MCG (5000 UT) Caps Stopped by: Dorita Sciara, MD     TAKE these medications   allopurinol 100 MG tablet Commonly known as: ZYLOPRIM Take 1 tablet (100 mg total) by mouth daily. TAKE 1 TABLET(100 MG) BY MOUTH TWICE DAILY   amLODipine 5 MG tablet Commonly known as: NORVASC Take 1 tablet (5 mg total) by mouth  daily. What changed:   medication strength  how much to take Changed by: Dorita Sciara, MD   atorvastatin 20 MG tablet Commonly known as: LIPITOR TAKE 1 TABLET(20 MG) BY MOUTH DAILY   glimepiride 2 MG tablet Commonly known as: AMARYL Take 1 tablet twice daily with a meal.   losartan 100 MG tablet Commonly known as: COZAAR Take 1 tablet (100 mg total) by mouth daily.   metFORMIN 1000 MG tablet Commonly known as: GLUCOPHAGE TAKE 1 TABLET(1000 MG) BY MOUTH TWICE DAILY WITH A MEAL   OneTouch Ultra test strip Generic drug: glucose blood USE TO CHECK BLOOD SUGAR EVERY DAY AS DIRECTED   VITAMIN D PO Take 3,000 Units by mouth daily.         REVIEW OF SYSTEMS: A comprehensive ROS was conducted with the patient and is negative except as per HPI     OBJECTIVE:  VS: BP (!) 150/84   Pulse 80   Ht 5\' 11"  (1.803 m)   Wt 221 lb 8 oz (100.5 kg)   SpO2 98%   BMI 30.89 kg/m    Wt Readings from Last 3 Encounters:  01/23/21 221 lb 8 oz (100.5 kg)  12/26/20 223 lb 6.4 oz (101.3 kg)  06/10/19 222 lb (100.7 kg)     EXAM: General: Pt appears well and is in NAD  Neck: General: Supple without adenopathy. Thyroid: Thyroid size normal.  No goiter or nodules appreciated.   Lungs: Clear with good BS bilat with no rales, rhonchi, or wheezes  Heart: Auscultation: RRR.  Abdomen: Normoactive bowel sounds, soft, nontender, without masses or organomegaly palpable  Extremities:  BL LE: No pretibial edema normal ROM and strength.  Skin: Hair: Texture and amount normal with gender appropriate distribution Skin Inspection: No rashes Skin Palpation: Skin temperature, texture, and thickness normal to palpation  Neuro: Cranial nerves: II - XII grossly intact  Motor: Normal strength throughout DTRs: 2+ and symmetric in UE without delay in relaxation phase  Mental Status: Judgment, insight: Intact Orientation: Oriented to time, place, and person Mood and affect: No depression,  anxiety, or agitation     DATA REVIEWED:   Results for Steve Beasley, Steve Beasley (MRN [id number]) as of 01/23/2021 10:29  Ref. Range 12/18/2020 08:01  Sodium Latest Ref Range: 135 - 145 mEq/L 133 (L)  Potassium Latest Ref Range: 3.5 - 5.1 mEq/L 4.1  Chloride Latest Ref Range: 96 - 112 mEq/L 96  CO2 Latest Ref Range: 19 - 32 mEq/L 27  Glucose Latest Ref Range: 70 - 99 mg/dL 144 (H)  BUN Latest Ref Range: 6 - 23 mg/dL 18  Creatinine Latest Ref Range: 0.40 - 1.50 mg/dL 1.29  Calcium Latest Ref Range: 8.4 - 10.5 mg/dL 10.8 (H)  Calcium Ionized Unknown CANCELED  Alkaline Phosphatase Latest Ref Range: 39 - 117 U/L 84  Albumin Latest Ref Range: 3.5 - 5.2  g/dL 4.5  Uric Acid, Serum Latest Ref Range: 4.0 - 7.8 mg/dL 4.8  AST Latest Ref Range: 0 - 37 U/L 38 (H)  ALT Latest Ref Range: 0 - 53 U/L 77 (H)  Total Protein Latest Ref Range: 6.0 - 8.3 g/dL 6.7  Total Bilirubin Latest Ref Range: 0.2 - 1.2 mg/dL 1.6 (H)  GFR Latest Ref Range: >60.00 mL/min 62.34  Results for Steve Beasley, Steve Beasley (MRN [id number]) as of 01/23/2021 10:29  Ref. Range 12/18/2020 08:01  VITD Latest Ref Range: 30.00 - 100.00 ng/mL 27.88 (L)  Results for Steve Beasley, Steve Beasley (MRN [id number]) as of 01/23/2021 10:29  Ref. Range 12/18/2020 08:01  PTH, Intact Latest Ref Range: 16 - 77 pg/mL 88 (H)  TSH Latest Ref Range: 0.35 - 4.50 uIU/mL 2.75      08/21/2020  AP spine  -0.4  RFN -0.7  Right total hip -0.7  Left proximal radius 0.9     ASSESSMENT/PLAN/RECOMMENDATIONS:   1. PTH- mediated Hypercalcemia :   - Suspect primary hyperparathyroidism Patient needs further evaluation to determine surgical candidacy - Will proceed with 24-hr urinary excretion of calcium, he was advised to stop the Maxzide and wait 4 weeks prior to starting the 24-hr urine collection.  - DXA normal 07/2020    Recommendations   - Encouraged hydration  - AVOID CALCIUM SUPPLEMENTS, AVOID LOW CALCIUM DIET - Maintain normal dietary calcium intake (2-3 servings  of dairy a day) - Continue Vitamin D 3000 iu daily    2. HTN:  - I have increased Amlodipine from 2.5 mg to 5 mg daily since I am going to stop his Mazxide - HTN will continue to be managed by PCP    F/U in 4 months      Signed electronically by: Mack Guise, MD  [facility] Endocrinology  [address] [place], [place] [address] Phone: [phone] FAX: [phone]   CC: Mosie Lukes, [address] Steve Beas[place] [address] Phone: [phone] Fax: [phone]   Return to Endocrinology clinic as below: No future appointments.

## 2021-01-29 DIAGNOSIS — R972 Elevated prostate specific antigen [PSA]: Secondary | ICD-10-CM | POA: Diagnosis not present

## 2021-01-29 DIAGNOSIS — R3912 Poor urinary stream: Secondary | ICD-10-CM | POA: Diagnosis not present

## 2021-03-05 DIAGNOSIS — Z125 Encounter for screening for malignant neoplasm of prostate: Secondary | ICD-10-CM | POA: Diagnosis not present

## 2021-03-12 DIAGNOSIS — R972 Elevated prostate specific antigen [PSA]: Secondary | ICD-10-CM | POA: Diagnosis not present

## 2021-04-09 DIAGNOSIS — D225 Melanocytic nevi of trunk: Secondary | ICD-10-CM | POA: Diagnosis not present

## 2021-04-09 DIAGNOSIS — B351 Tinea unguium: Secondary | ICD-10-CM | POA: Diagnosis not present

## 2021-04-09 DIAGNOSIS — L570 Actinic keratosis: Secondary | ICD-10-CM | POA: Diagnosis not present

## 2021-04-09 DIAGNOSIS — L578 Other skin changes due to chronic exposure to nonionizing radiation: Secondary | ICD-10-CM | POA: Diagnosis not present

## 2021-04-09 DIAGNOSIS — Z85820 Personal history of malignant melanoma of skin: Secondary | ICD-10-CM | POA: Diagnosis not present

## 2021-05-04 ENCOUNTER — Other Ambulatory Visit: Payer: Self-pay

## 2021-05-04 ENCOUNTER — Other Ambulatory Visit: Payer: BC Managed Care – PPO

## 2021-05-04 NOTE — Addendum Note (Signed)
Addended by: Manuela Schwartz on: 05/04/2021 01:13 PM   Modules accepted: Orders

## 2021-05-09 LAB — CALCIUM, URINE, 24 HOUR

## 2021-05-09 LAB — CREATININE, URINE, 24 HOUR

## 2021-05-09 LAB — TIQ-MISC

## 2021-05-10 ENCOUNTER — Telehealth: Payer: Self-pay | Admitting: *Deleted

## 2021-05-10 NOTE — Telephone Encounter (Signed)
Received fax on Wednesday date 8/5 from [facility] stating [facility] DUR. Spoke with CSR on 8/10. She stated they were missing the total volume and duration of specimen. I asked her to pull the requisition because all info was written on the requisition. She was able to pull up info.  Received fax today date 05/04/21 that said 'test not performed, no suitable specimen received'. Spoke with [place] at [facility] today. She states the 'test not performed' message was sent when they did not get a response from [place] the same day about the duration / volume. She verified that the 24 hour urine tests are now being performed.

## 2021-05-13 ENCOUNTER — Encounter: Payer: Self-pay | Admitting: Internal Medicine

## 2021-05-21 LAB — CREATININE, URINE, 24 HOUR: Creatinine, 24H Ur: 2.02 g/(24.h) (ref 0.50–2.15)

## 2021-05-21 LAB — CALCIUM, URINE, 24 HOUR: Calcium, 24H Urine: 204 mg/24 h

## 2021-05-31 DIAGNOSIS — L814 Other melanin hyperpigmentation: Secondary | ICD-10-CM | POA: Diagnosis not present

## 2021-05-31 DIAGNOSIS — D485 Neoplasm of uncertain behavior of skin: Secondary | ICD-10-CM | POA: Diagnosis not present

## 2021-05-31 DIAGNOSIS — L570 Actinic keratosis: Secondary | ICD-10-CM | POA: Diagnosis not present

## 2021-05-31 DIAGNOSIS — D1801 Hemangioma of skin and subcutaneous tissue: Secondary | ICD-10-CM | POA: Diagnosis not present

## 2021-05-31 DIAGNOSIS — Z85828 Personal history of other malignant neoplasm of skin: Secondary | ICD-10-CM | POA: Diagnosis not present

## 2021-05-31 DIAGNOSIS — D229 Melanocytic nevi, unspecified: Secondary | ICD-10-CM | POA: Diagnosis not present

## 2021-06-05 DIAGNOSIS — R972 Elevated prostate specific antigen [PSA]: Secondary | ICD-10-CM | POA: Diagnosis not present

## 2021-06-11 DIAGNOSIS — R3912 Poor urinary stream: Secondary | ICD-10-CM | POA: Diagnosis not present

## 2021-06-11 DIAGNOSIS — Z8547 Personal history of malignant neoplasm of testis: Secondary | ICD-10-CM | POA: Diagnosis not present

## 2021-06-11 DIAGNOSIS — R972 Elevated prostate specific antigen [PSA]: Secondary | ICD-10-CM | POA: Diagnosis not present

## 2021-07-14 ENCOUNTER — Other Ambulatory Visit: Payer: Self-pay | Admitting: Family Medicine

## 2021-07-16 ENCOUNTER — Other Ambulatory Visit: Payer: Self-pay | Admitting: Family Medicine

## 2021-10-02 ENCOUNTER — Other Ambulatory Visit: Payer: Self-pay | Admitting: Family Medicine

## 2021-10-02 ENCOUNTER — Telehealth: Payer: Self-pay | Admitting: Family Medicine

## 2021-10-02 NOTE — Telephone Encounter (Signed)
Medication:  losartan (COZAAR) 100 MG tablet [[id number]]  atorvastatin (LIPITOR) 20 MG tablet [[id number]]    Has the patient contacted their pharmacy? Yes.   (If no, request that the patient contact the pharmacy for the refill.) (If yes, when and what did the pharmacy advise?) pt was advised that labs are needed to refill  atorvastatin. pt would like to know if a 30 days supply can be done prior to appt due to work sched.   Preferred Pharmacy (with phone number or street name):  [id number] [place], [place] - [address]  [place], [place] [address]  Phone:  [phone]  Fax:  [phone]     Agent: Please be advised that RX refills may take up to 3 business days. We ask that you follow-up with your pharmacy.

## 2021-10-03 ENCOUNTER — Other Ambulatory Visit: Payer: Self-pay | Admitting: Family Medicine

## 2021-10-03 MED ORDER — LOSARTAN POTASSIUM 100 MG PO TABS: ORAL_TABLET | ORAL | 1 refills | Status: DC

## 2021-10-03 MED ORDER — ATORVASTATIN CALCIUM 20 MG PO TABS: 20.0000 mg | ORAL_TABLET | Freq: Every day | ORAL | 1 refills | Status: DC

## 2021-10-03 NOTE — Telephone Encounter (Signed)
Left message on machine that he will to call back to make an appointment.  Refills have been sent in.

## 2021-11-21 DIAGNOSIS — C44612 Basal cell carcinoma of skin of right upper limb, including shoulder: Secondary | ICD-10-CM | POA: Diagnosis not present

## 2021-11-21 DIAGNOSIS — C44519 Basal cell carcinoma of skin of other part of trunk: Secondary | ICD-10-CM | POA: Diagnosis not present

## 2021-11-29 DIAGNOSIS — D229 Melanocytic nevi, unspecified: Secondary | ICD-10-CM | POA: Diagnosis not present

## 2021-11-29 DIAGNOSIS — L814 Other melanin hyperpigmentation: Secondary | ICD-10-CM | POA: Diagnosis not present

## 2021-11-29 DIAGNOSIS — Z85828 Personal history of other malignant neoplasm of skin: Secondary | ICD-10-CM | POA: Diagnosis not present

## 2021-11-29 DIAGNOSIS — D1801 Hemangioma of skin and subcutaneous tissue: Secondary | ICD-10-CM | POA: Diagnosis not present

## 2021-11-30 ENCOUNTER — Encounter: Payer: Self-pay | Admitting: Family

## 2021-11-30 ENCOUNTER — Ambulatory Visit: Payer: BC Managed Care – PPO | Admitting: Family

## 2021-11-30 VITALS — BP 152/80 | HR 56 | Temp 97.9°F | Ht 71.0 in | Wt 219.4 lb

## 2021-11-30 DIAGNOSIS — R972 Elevated prostate specific antigen [PSA]: Secondary | ICD-10-CM

## 2021-11-30 DIAGNOSIS — M109 Gout, unspecified: Secondary | ICD-10-CM

## 2021-11-30 DIAGNOSIS — Z720 Tobacco use: Secondary | ICD-10-CM

## 2021-11-30 DIAGNOSIS — E119 Type 2 diabetes mellitus without complications: Secondary | ICD-10-CM | POA: Diagnosis not present

## 2021-11-30 DIAGNOSIS — I10 Essential (primary) hypertension: Secondary | ICD-10-CM | POA: Diagnosis not present

## 2021-11-30 DIAGNOSIS — E559 Vitamin D deficiency, unspecified: Secondary | ICD-10-CM

## 2021-11-30 DIAGNOSIS — E785 Hyperlipidemia, unspecified: Secondary | ICD-10-CM | POA: Diagnosis not present

## 2021-11-30 LAB — COMPREHENSIVE METABOLIC PANEL
ALT: 59 U/L — ABNORMAL HIGH (ref 0–53)
AST: 33 U/L (ref 0–37)
Albumin: 4.4 g/dL (ref 3.5–5.2)
Alkaline Phosphatase: 104 U/L (ref 39–117)
BUN: 20 mg/dL (ref 6–23)
CO2: 27 mEq/L (ref 19–32)
Calcium: 11 mg/dL — ABNORMAL HIGH (ref 8.4–10.5)
Chloride: 99 mEq/L (ref 96–112)
Creatinine, Ser: 1.37 mg/dL (ref 0.40–1.50)
GFR: 57.61 mL/min — ABNORMAL LOW (ref >60.00)
Glucose, Bld: 204 mg/dL — ABNORMAL HIGH (ref 70–99)
Potassium: 4 mEq/L (ref 3.5–5.1)
Sodium: 134 mEq/L — ABNORMAL LOW (ref 135–145)
Total Bilirubin: 1.4 mg/dL — ABNORMAL HIGH (ref 0.2–1.2)
Total Protein: 6.8 g/dL (ref 6.0–8.3)

## 2021-11-30 LAB — LIPID PANEL
Cholesterol: 99 mg/dL (ref 0–200)
HDL: 31.5 mg/dL — ABNORMAL LOW (ref >39.00)
LDL Cholesterol: 38 mg/dL (ref 0–99)
NonHDL: 67.27
Total CHOL/HDL Ratio: 3
Triglycerides: 146 mg/dL (ref 0.0–149.0)
VLDL: 29.2 mg/dL (ref 0.0–40.0)

## 2021-11-30 LAB — VITAMIN D 25 HYDROXY (VIT D DEFICIENCY, FRACTURES): VITD: 23.79 ng/mL — ABNORMAL LOW (ref 30.00–100.00)

## 2021-11-30 LAB — HEMOGLOBIN A1C: Hgb A1c MFr Bld: 6.9 % — ABNORMAL HIGH (ref 4.6–6.5)

## 2021-11-30 MED ORDER — METFORMIN HCL 1000 MG PO TABS: 1000.0000 mg | ORAL_TABLET | Freq: Two times a day (BID) | ORAL | 1 refills | Status: DC

## 2021-11-30 MED ORDER — AMLODIPINE BESYLATE 10 MG PO TABS: 10.0000 mg | ORAL_TABLET | Freq: Every day | ORAL | 0 refills | Status: DC

## 2021-11-30 MED ORDER — TRIAMTERENE-HCTZ 37.5-25 MG PO TABS: 1.0000 | ORAL_TABLET | Freq: Every day | ORAL | 1 refills | Status: DC

## 2021-11-30 MED ORDER — ALLOPURINOL 100 MG PO TABS: ORAL_TABLET | ORAL | 1 refills | Status: DC

## 2021-11-30 MED ORDER — TRIAMTERENE-HCTZ 37.5-25 MG PO TABS: 1.0000 | ORAL_TABLET | Freq: Every day | ORAL | 3 refills | Status: DC

## 2021-11-30 MED ORDER — GLIMEPIRIDE 2 MG PO TABS: ORAL_TABLET | ORAL | 1 refills | Status: DC

## 2021-11-30 NOTE — Assessment & Plan Note (Signed)
Counseled on importance of tobacco cessation.  ?

## 2021-11-30 NOTE — Patient Instructions (Signed)
Schedule routine dental and eye exam. ?Complete lab work prior to leaving. ?Increase amodipine from 5mg  to 10mg .   ?

## 2021-11-30 NOTE — Assessment & Plan Note (Signed)
Has appointment with Urology tomorrow.  Reports that his PSA has been stable/improved.  ?

## 2021-11-30 NOTE — Assessment & Plan Note (Addendum)
Lab Results  ?Component Value Date  ? HGBA1C 7.6 (H) 12/18/2020  ? HGBA1C 7.2 (H) 11/23/2019  ? HGBA1C 6.3 08/18/2019  ? ?Lab Results  ?Component Value Date  ? MICROALBUR 1.3 08/21/2015  ? LDLCALC 46 11/23/2019  ? CREATININE 1.29 12/18/2020  ? ?Per report, suspect A1C will be above goal.  Continue metformin, amaryl. If >7 we discussed addition of [place].  ?

## 2021-11-30 NOTE — Assessment & Plan Note (Signed)
BP Readings from Last 3 Encounters:  ?11/30/21 (!) 152/80  ?01/23/21 (!) 150/84  ?12/26/20 114/72  ? ?Uncontrolled. Increase amlodipine from 5mg  to 10mg .  ?

## 2021-11-30 NOTE — Assessment & Plan Note (Signed)
No flare up.  Continues allopurinol twice daily.  ?

## 2021-11-30 NOTE — Assessment & Plan Note (Addendum)
Lab Results  ?Component Value Date  ? CHOL 103 12/18/2020  ? HDL 31.20 (L) 12/18/2020  ? LDLCALC 46 11/23/2019  ? LDLDIRECT 51.0 12/18/2020  ? TRIG 219.0 (H) 12/18/2020  ? CHOLHDL 3 12/18/2020  ? ?Maintained on atorvastatin. Obtain follow up lipid panel.  ?

## 2021-11-30 NOTE — Assessment & Plan Note (Signed)
>>  ASSESSMENT AND PLAN FOR HTN (HYPERTENSION) WRITTEN ON 11/30/2021  7:42 AM BY O'SULLIVAN, Zakir Henner, NP  BP Readings from Last 3 Encounters:  11/30/21 (!) 152/80  01/23/21 (!) 150/84  12/26/20 114/72   Uncontrolled. Increase amlodipine from 5mg  to 10mg .

## 2021-11-30 NOTE — Progress Notes (Signed)
Subjective:   By signing my name below, I, Shehryar Baig, attest that this documentation has been prepared under the direction and in the presence of Debbrah Alar NP. 11/30/2021     Patient ID: Steve Beasley, male    DOB: November 01, 1965, 56 y.o.   MRN: [id number]  Chief Complaint  Patient presents with   Medical Management of Chronic Issues    And follow up on meds     HPI Patient is in today for a office visit.   Blood Pressure - Patient is concerned with increased blood pressure. To decrease blood pressure, patient is recommended to increase 5 mg of amlodipine to 10 mg. Patient is recommended to increase dosage once the 5 mg of amlodipine runs out. He is recommended to follow - up in a month.  BP Readings from Last 3 Encounters:  11/30/21 (!) 152/80  01/23/21 (!) 150/84  12/26/20 114/72   Pulse Readings from Last 3 Encounters:  11/30/21 (!) 56  01/23/21 80  12/26/20 (!) 57   Blood Sugar - Patient is currently taking 1000 MG of metformin twice a day. He is also taking 2 MG glimepiride twice a day. Patient regularly exercises to keep blood sugar downs but states that blood sugar spikes up after a meal. Patient is recommended to take Januvia to combat increased blood sugar.  Lab Results  Component Value Date   HGBA1C 7.6 (H) 12/18/2020   Refills - Patient requests a refill metformin and amlodipine. Losartan has been recently refilled.   Gout - Patient is currently taking 1 dose of allopurinol as of 6 months ago. Symptoms have decreased.  Cholesterol - Cholesterol levels have been stable. Patient is concerned with Vitamin D levels.  Lab Results  Component Value Date   CHOL 103 12/18/2020   HDL 31.20 (L) 12/18/2020   LDLCALC 46 11/23/2019   LDLDIRECT 51.0 12/18/2020   TRIG 219.0 (H) 12/18/2020   CHOLHDL 3 12/18/2020     PSA - Patient has a history of PSA. Patient is currently scheduled to see a urologist on 12/01/2021.    Tobacco - Patient is currently using  smokeless tobacco.   Eye/Dental exam - Patient is overdue for an eye and dental exam. He is recommended to schedule an appointment.  Vaccinations - Patient believes he has taken a flu shot but is not confident. Patient declines of getting the shingles vaccine.    Health Maintenance Due  Topic Date Due   FOOT EXAM  Never done   Zoster Vaccines- Shingrix (1 of 2) Never done   OPHTHALMOLOGY EXAM  10/04/2016   COVID-19 Vaccine (4 - Booster for Pfizer series) 10/20/2020   HEMOGLOBIN A1C  06/20/2021    Past Medical History:  Diagnosis Date   Allergy-induced asthma    Anxiety state, unspecified 09/19/2013   Atypical chest pain 11/13/2014   Basal cell carcinoma of scalp 09/09/2016   and back   Diabetes mellitus without complication ([place])    Fatty liver disease, nonalcoholic 23/53/6144   Gout 09/18/2014   Hypertension    Melanoma ([place])    left upper arm   Nicotine use disorder 06/06/2013   smokeless   Obesity, unspecified 06/06/2013   Osteopenia    Other and unspecified hyperlipidemia 01/19/2014   Preventative health care 09/19/2013   Right heart enlargement 09/18/2014   Testicular cancer ([place])    Tinnitus 03/24/2017   Valvular heart disease 09/18/2014    Past Surgical History:  Procedure Laterality Date   BASAL CELL CARCINOMA  EXCISION     Within 6 months   COLONOSCOPY     POLYPECTOMY     SKIN CANCER EXCISION     Within 6 months   testicular cancer surgery      Family History  Problem Relation Age of Onset   Diabetes Mother    Breast cancer Mother    Bone cancer Mother    Cancer Mother        breast with bone mets   Depression Mother    Hypertension Father    Prostate cancer Father    Hyperlipidemia Father    Cancer Father        prostate   Colon polyps Father    Stroke Paternal Grandfather    Prostate cancer Maternal Grandfather    Hypertension Brother    Gallstones Brother    Anxiety disorder Daughter    ADD / ADHD Daughter    Bulemia Daughter    Depression  Son    Stroke Maternal Uncle 55   Anxiety disorder Maternal Aunt        social anxiety, anger   Heart attack Neg Hx    Sudden death Neg Hx    Colon cancer Neg Hx    Stomach cancer Neg Hx    Rectal cancer Neg Hx    Esophageal cancer Neg Hx     Social History   Socioeconomic History   Marital status: Married    Spouse name: Not on file   Number of children: 2   Years of education: Not on file   Highest education level: Not on file  Occupational History   Occupation: finance  Tobacco Use   Smoking status: Former    Types: Cigarettes   Smokeless tobacco: Current    Types: Snuff, Chew    Last attempt to quit: 02/28/2014   Tobacco comments:    30 years ago socially/ 06/10/19 pt  uses oral nicotine chew tobacco free   Substance and Sexual Activity   Alcohol use: Yes    Alcohol/week: 0.0 standard drinks    Comment: 2 drinks per day varies    Drug use: No   Sexual activity: Yes    Comment: lives with wife, travels with work, no dietary restrictions , exercise regular  Other Topics Concern   Not on file  Social History Narrative   Not on file   Social Determinants of Health   Financial Resource Strain: Not on file  Food Insecurity: Not on file  Transportation Needs: Not on file  Physical Activity: Not on file  Stress: Not on file  Social Connections: Not on file  Intimate Partner Violence: Not on file    Outpatient Medications Prior to Visit  Medication Sig Dispense Refill   atorvastatin (LIPITOR) 20 MG tablet Take 1 tablet (20 mg total) by mouth daily. 90 tablet 1   glucose blood (ONETOUCH ULTRA) test strip USE TO CHECK BLOOD SUGAR DAILY AS DIRECTED. 100 strip 0   losartan (COZAAR) 100 MG tablet TAKE 1 TABLET(100 MG) BY MOUTH DAILY 90 tablet 1   allopurinol (ZYLOPRIM) 100 MG tablet TAKE 1 TABLET(100 MG) BY MOUTH TWICE DAILY 180 tablet 0   amLODipine (NORVASC) 5 MG tablet Take 1 tablet (5 mg total) by mouth daily. 90 tablet 1   glimepiride (AMARYL) 2 MG tablet Take 1  tablet twice daily with a meal. 180 tablet 1   metFORMIN (GLUCOPHAGE) 1000 MG tablet TAKE 1 TABLET(1000 MG) BY MOUTH TWICE DAILY WITH A MEAL 180 tablet 1  VITAMIN D PO Take 3,000 Units by mouth daily. (Patient not taking: Reported on 11/30/2021)     No facility-administered medications prior to visit.    No Known Allergies  ROS    See HPI Objective:    Physical Exam Constitutional:      General: He is not in acute distress.    Appearance: Normal appearance. He is not ill-appearing.  HENT:     Head: Normocephalic and atraumatic.     Right Ear: External ear normal.     Left Ear: External ear normal.  Eyes:     Extraocular Movements: Extraocular movements intact.     Pupils: Pupils are equal, round, and reactive to light.  Cardiovascular:     Rate and Rhythm: Normal rate and regular rhythm.     Pulses:          Dorsalis pedis pulses are 2+ on the right side and 2+ on the left side.       Posterior tibial pulses are 2+ on the right side and 2+ on the left side.     Heart sounds: Normal heart sounds. No murmur heard.   No gallop.  Pulmonary:     Effort: Pulmonary effort is normal. No respiratory distress.     Breath sounds: Normal breath sounds. No wheezing or rales.  Skin:    General: Skin is warm and dry.  Neurological:     Mental Status: He is alert and oriented to person, place, and time.  Psychiatric:        Judgment: Judgment normal.    BP (!) 152/80    Pulse (!) 56    Temp 97.9 F (36.6 C) (Oral)    Ht '5\' 11"'  (1.803 m)    Wt 219 lb 6.4 oz (99.5 kg)    SpO2 97%    BMI 30.60 kg/m  Wt Readings from Last 3 Encounters:  11/30/21 219 lb 6.4 oz (99.5 kg)  01/23/21 221 lb 8 oz (100.5 kg)  12/26/20 223 lb 6.4 oz (101.3 kg)       Assessment & Plan:   Problem List Items Addressed This Visit       Unprioritized   Smokeless tobacco use    Counseled on importance of tobacco cessation.       Hyperlipidemia    Lab Results  Component Value Date   CHOL 103 12/18/2020    HDL 31.20 (L) 12/18/2020   LDLCALC 46 11/23/2019   LDLDIRECT 51.0 12/18/2020   TRIG 219.0 (H) 12/18/2020   CHOLHDL 3 12/18/2020  Maintained on atorvastatin. Obtain follow up lipid panel.       Relevant Medications   amLODipine (NORVASC) 10 MG tablet   triamterene-hydrochlorothiazide (MAXZIDE-25) 37.5-25 MG tablet   Other Relevant Orders   Lipid panel   HTN (hypertension)    BP Readings from Last 3 Encounters:  11/30/21 (!) 152/80  01/23/21 (!) 150/84  12/26/20 114/72  Uncontrolled. Increase amlodipine from 13m to 12m       Relevant Medications   amLODipine (NORVASC) 10 MG tablet   triamterene-hydrochlorothiazide (MAXZIDE-25) 37.5-25 MG tablet   Other Relevant Orders   Comp Met (CMET)   Gout    No flare up.  Continues allopurinol twice daily.       Elevated PSA    Has appointment with Urology tomorrow.  Reports that his PSA has been stable/improved.       DM (diabetes mellitus), type 2 ([place]   Lab Results  Component Value Date  HGBA1C 7.6 (H) 12/18/2020   HGBA1C 7.2 (H) 11/23/2019   HGBA1C 6.3 08/18/2019   Lab Results  Component Value Date   MICROALBUR 1.3 08/21/2015   LDLCALC 46 11/23/2019   CREATININE 1.29 12/18/2020  Per report, suspect A1C will be above goal.  Continue metformin, amaryl. If >7 we discussed addition of [place].       Relevant Medications   glimepiride (AMARYL) 2 MG tablet   metFORMIN (GLUCOPHAGE) 1000 MG tablet   Other Relevant Orders   Hemoglobin A1c   Other Visit Diagnoses     Vitamin D deficiency    -  Primary   Relevant Orders   VITAMIN D 25 Hydroxy (Vit-D Deficiency, Fractures)   Tobacco abuse            Meds ordered this encounter  Medications   DISCONTD: triamterene-hydrochlorothiazide (MAXZIDE-25) 37.5-25 MG tablet    Sig: Take 1 tablet by mouth daily.    Dispense:  90 tablet    Refill:  3    Order Specific Question:   Supervising Provider    Answer:   Penni Homans A [[id number]]   amLODipine (NORVASC) 10 MG tablet     Sig: Take 1 tablet (10 mg total) by mouth daily.    Dispense:  90 tablet    Refill:  0    Order Specific Question:   Supervising Provider    Answer:   Penni Homans A [[id number]]   allopurinol (ZYLOPRIM) 100 MG tablet    Sig: TAKE 1 TABLET(100 MG) BY MOUTH TWICE DAILY    Dispense:  180 tablet    Refill:  1    Order Specific Question:   Supervising Provider    Answer:   Penni Homans A [[id number]]   glimepiride (AMARYL) 2 MG tablet    Sig: Take 1 tablet twice daily with a meal.    Dispense:  180 tablet    Refill:  1    Order Specific Question:   Supervising Provider    Answer:   Penni Homans A [[id number]]   metFORMIN (GLUCOPHAGE) 1000 MG tablet    Sig: Take 1 tablet (1,000 mg total) by mouth 2 (two) times daily with a meal.    Dispense:  180 tablet    Refill:  1    Order Specific Question:   Supervising Provider    Answer:   Penni Homans A [[id number]]   triamterene-hydrochlorothiazide (MAXZIDE-25) 37.5-25 MG tablet    Sig: Take 1 tablet by mouth daily.    Dispense:  90 tablet    Refill:  1    Order Specific Question:   Supervising Provider    Answer:   Penni Homans A [[id number]]    I, Nance Pear, NP, personally preformed the services described in this documentation.  All medical record entries made by the scribe were at my direction and in my presence.  I have reviewed the chart and discharge instructions (if applicable) and agree that the record reflects my personal performance and is accurate and complete. 11/30/2021   I,Shehryar Baig,acting as a scribe for Nance Pear, NP.,have documented all relevant documentation on the behalf of Nance Pear, NP,as directed by  Nance Pear, NP while in the presence of Nance Pear, NP.   Nance Pear, NP

## 2021-12-03 ENCOUNTER — Telehealth: Payer: Self-pay | Admitting: Family

## 2021-12-03 DIAGNOSIS — R972 Elevated prostate specific antigen [PSA]: Secondary | ICD-10-CM | POA: Diagnosis not present

## 2021-12-03 NOTE — Telephone Encounter (Signed)
Patient called back and was advised of results, provider's comments, to start otc Vit D 3000 iu and to follow up with endo about the calcium. He verbalized understanding.  ?

## 2021-12-03 NOTE — Telephone Encounter (Signed)
Sugar is improved.  Vit D is low. Please restart the otc vit D 3000 iu once daily. Calcium remains elevated. He should continue his follow up with Dr. Kelton Pillar for this. ? ?Liver testing is mildly abnormal but better than last time it was checked.  Fatty liver is most common cause for this finding and is treated with low fat diet/exercise and weight loss.  ? ?"Bad cholesterol" is at goal. Continue lipitor.  ?

## 2021-12-03 NOTE — Telephone Encounter (Signed)
Called but no answer, lvm for patient to call back 

## 2021-12-08 ENCOUNTER — Other Ambulatory Visit: Payer: Self-pay | Admitting: Family Medicine

## 2021-12-08 MED ORDER — VITAMIN D (ERGOCALCIFEROL) 1.25 MG (50000 UNIT) PO CAPS: 50000.0000 [IU] | ORAL_CAPSULE | ORAL | 0 refills | Status: DC

## 2021-12-19 ENCOUNTER — Ambulatory Visit: Payer: BC Managed Care – PPO | Admitting: Family

## 2021-12-19 VITALS — BP 133/58 | HR 66 | Temp 98.3°F | Resp 16 | Wt 215.0 lb

## 2021-12-19 DIAGNOSIS — M5412 Radiculopathy, cervical region: Secondary | ICD-10-CM | POA: Diagnosis not present

## 2021-12-19 DIAGNOSIS — I10 Essential (primary) hypertension: Secondary | ICD-10-CM | POA: Diagnosis not present

## 2021-12-19 DIAGNOSIS — R7989 Other specified abnormal findings of blood chemistry: Secondary | ICD-10-CM

## 2021-12-19 DIAGNOSIS — E213 Hyperparathyroidism, unspecified: Secondary | ICD-10-CM

## 2021-12-19 DIAGNOSIS — E119 Type 2 diabetes mellitus without complications: Secondary | ICD-10-CM

## 2021-12-19 NOTE — Assessment & Plan Note (Signed)
He will begin weekly vitamin D 50000 iu x 12 weeks.  ?

## 2021-12-19 NOTE — Progress Notes (Signed)
? ?Subjective:  ? ? ? Patient ID: Steve Beasley, male    DOB: 1966-09-20, 56 y.o.   MRN: [id number] ? ?Chief Complaint  ?Patient presents with  ? Hypertension  ?  Follow up after medication dose increased.   ? ? ?HPI ? ?Patient is in today for blood pressure follow up. ? ?Last visit we increased amlodipine from '5mg'$  to '10mg'$ .  Maxide-25 was continued. He notes that his LE edema has improved.  ? ?BP Readings from Last 3 Encounters:  ?12/19/21 (!) 133/58  ?11/30/21 (!) 152/80  ?01/23/21 (!) 150/84  ? ?Notes 1 week hx of some lower posterior neck pain with pain radiating down the left arm. Notes that he has been using ibuprofen prn which seems to be helping. He is not concerned about this, but wanted [place] to be aware.  ? ?Health Maintenance Due  ?Topic Date Due  ? Zoster Vaccines- Shingrix (1 of 2) Never done  ? OPHTHALMOLOGY EXAM  10/04/2016  ? COVID-19 Vaccine (4 - Booster for Pfizer series) 10/20/2020  ? ? ?Past Medical History:  ?Diagnosis Date  ? Allergy-induced asthma   ? Anxiety state, unspecified 09/19/2013  ? Atypical chest pain 11/13/2014  ? Basal cell carcinoma of scalp 09/09/2016  ? and back  ? Diabetes mellitus without complication ([place])   ? Fatty liver disease, nonalcoholic 02/77/4128  ? Gout 09/18/2014  ? Hypertension   ? Melanoma ([place])   ? left upper arm  ? Nicotine use disorder 06/06/2013  ? smokeless  ? Obesity, unspecified 06/06/2013  ? Osteopenia   ? Other and unspecified hyperlipidemia 01/19/2014  ? Preventative health care 09/19/2013  ? Right heart enlargement 09/18/2014  ? Testicular cancer ([place])   ? Tinnitus 03/24/2017  ? Valvular heart disease 09/18/2014  ? ? ?Past Surgical History:  ?Procedure Laterality Date  ? BASAL CELL CARCINOMA EXCISION    ? Within 6 months  ? COLONOSCOPY    ? POLYPECTOMY    ? SKIN CANCER EXCISION    ? Within 6 months  ? testicular cancer surgery    ? ? ?Family History  ?Problem Relation Age of Onset  ? Diabetes Mother   ? Breast cancer Mother   ? Bone cancer Mother   ? Cancer  Mother   ?     breast with bone mets  ? Depression Mother   ? Hypertension Father   ? Prostate cancer Father   ? Hyperlipidemia Father   ? Cancer Father   ?     prostate  ? Colon polyps Father   ? Stroke Paternal Grandfather   ? Prostate cancer Maternal Grandfather   ? Hypertension Brother   ? Gallstones Brother   ? Anxiety disorder Daughter   ? ADD / ADHD Daughter   ? Bulemia Daughter   ? Depression Son   ? Stroke Maternal Uncle 35  ? Anxiety disorder Maternal Aunt   ?     social anxiety, anger  ? Heart attack Neg Hx   ? Sudden death Neg Hx   ? Colon cancer Neg Hx   ? Stomach cancer Neg Hx   ? Rectal cancer Neg Hx   ? Esophageal cancer Neg Hx   ? ? ?Social History  ? ?Socioeconomic History  ? Marital status: Married  ?  Spouse name: Not on file  ? Number of children: 2  ? Years of education: Not on file  ? Highest education level: Not on file  ?Occupational History  ? Occupation: finance  ?  Tobacco Use  ? Smoking status: Former  ?  Types: Cigarettes  ? Smokeless tobacco: Current  ?  Types: Snuff, Chew  ?  Last attempt to quit: 02/28/2014  ? Tobacco comments:  ?  30 years ago socially/ 06/10/19 pt  uses oral nicotine chew tobacco free   ?Substance and Sexual Activity  ? Alcohol use: Yes  ?  Alcohol/week: 0.0 standard drinks  ?  Comment: 2 drinks per day varies   ? Drug use: No  ? Sexual activity: Yes  ?  Comment: lives with wife, travels with work, no dietary restrictions , exercise regular  ?Other Topics Concern  ? Not on file  ?Social History Narrative  ? Not on file  ? ?Social Determinants of Health  ? ?Financial Resource Strain: Not on file  ?Food Insecurity: Not on file  ?Transportation Needs: Not on file  ?Physical Activity: Not on file  ?Stress: Not on file  ?Social Connections: Not on file  ?Intimate Partner Violence: Not on file  ? ? ?Outpatient Medications Prior to Visit  ?Medication Sig Dispense Refill  ? allopurinol (ZYLOPRIM) 100 MG tablet TAKE 1 TABLET(100 MG) BY MOUTH TWICE DAILY 180 tablet 1  ?  amLODipine (NORVASC) 10 MG tablet Take 1 tablet (10 mg total) by mouth daily. 90 tablet 0  ? atorvastatin (LIPITOR) 20 MG tablet Take 1 tablet (20 mg total) by mouth daily. 90 tablet 1  ? glimepiride (AMARYL) 2 MG tablet Take 1 tablet twice daily with a meal. 180 tablet 1  ? glucose blood (ONETOUCH ULTRA) test strip USE TO CHECK BLOOD SUGAR DAILY AS DIRECTED. 100 strip 0  ? losartan (COZAAR) 100 MG tablet TAKE 1 TABLET(100 MG) BY MOUTH DAILY 90 tablet 1  ? metFORMIN (GLUCOPHAGE) 1000 MG tablet Take 1 tablet (1,000 mg total) by mouth 2 (two) times daily with a meal. 180 tablet 1  ? triamterene-hydrochlorothiazide (MAXZIDE-25) 37.5-25 MG tablet Take 1 tablet by mouth daily. 90 tablet 1  ? VITAMIN D PO Take 3,000 Units by mouth daily.    ? Vitamin D, Ergocalciferol, (DRISDOL) 1.25 MG (50000 UNIT) CAPS capsule Take 1 capsule (50,000 Units total) by mouth every 7 (seven) days. 12 capsule 0  ? ?No facility-administered medications prior to visit.  ? ? ?No Known Allergies ? ?ROS ? ?  See HPI ?Objective:  ?  ?Physical Exam ?Constitutional:   ?   General: He is not in acute distress. ?   Appearance: He is well-developed.  ?HENT:  ?   Head: Normocephalic and atraumatic.  ?Cardiovascular:  ?   Rate and Rhythm: Normal rate and regular rhythm.  ?   Heart sounds: No murmur heard. ?Pulmonary:  ?   Effort: Pulmonary effort is normal. No respiratory distress.  ?   Breath sounds: Normal breath sounds. No wheezing or rales.  ?Musculoskeletal:  ?   Right lower leg: 1+ Edema present.  ?   Left lower leg: 1+ Edema present.  ?Skin: ?   General: Skin is warm and dry.  ?Neurological:  ?   Mental Status: He is alert and oriented to person, place, and time.  ?Psychiatric:     ?   Behavior: Behavior normal.     ?   Thought Content: Thought content normal.  ? ? ?BP (!) 133/58 (BP Location: Right Arm, Patient Position: Sitting, Cuff Size: Large)   Pulse 66   Temp 98.3 ?F (36.8 ?C) (Oral)   Resp 16   Wt 215 lb (97.5 kg)  SpO2 100%   BMI  29.99 kg/m?  ?Wt Readings from Last 3 Encounters:  ?12/19/21 215 lb (97.5 kg)  ?11/30/21 219 lb 6.4 oz (99.5 kg)  ?01/23/21 221 lb 8 oz (100.5 kg)  ? ? ?   ?Assessment & Plan:  ? ?Problem List Items Addressed This Visit   ? ?  ? Unprioritized  ? Low vitamin D level  ?  He will begin weekly vitamin D 50000 iu x 12 weeks.  ?  ?  ? Hyperparathyroidism ([place])  ?  Will plan to repeat calcium/PTH after his vit D levels normalize. ?  ?  ? HTN (hypertension)  ?  Improved and at goal. Continue maxide and increased dose of amlodipine.  ?  ?  ? DM (diabetes mellitus), type 2 ([place])  ?  Lab Results  ?Component Value Date  ? HGBA1C 6.9 (H) 11/30/2021  ? HGBA1C 7.6 (H) 12/18/2020  ? HGBA1C 7.2 (H) 11/23/2019  ? ?Lab Results  ?Component Value Date  ? MICROALBUR 1.3 08/21/2015  ? LDLCALC 38 11/30/2021  ? CREATININE 1.37 11/30/2021  ?Follow up A1C is at goal. He is trying to exercise more and eat healthier.  ?  ?  ? Cervical radiculopathy - Primary  ?  New. He is managing with ibuprofen prn and will let us know if symptoms worsen.  ?  ?  ? ? ?I am having Steve Peavy. Larene Beasley maintain his VITAMIN D PO, OneTouch Ultra, atorvastatin, losartan, amLODipine, allopurinol, glimepiride, metFORMIN, triamterene-hydrochlorothiazide, and Vitamin D (Ergocalciferol). ? ?No orders of the defined types were placed in this encounter. ? ?

## 2021-12-19 NOTE — Assessment & Plan Note (Signed)
>>  ASSESSMENT AND PLAN FOR HTN (HYPERTENSION) WRITTEN ON 12/19/2021  9:49 AM BY O'SULLIVAN, Cederic Mozley, NP  Improved and at goal. Continue maxide and increased dose of amlodipine.

## 2021-12-19 NOTE — Assessment & Plan Note (Signed)
Will plan to repeat calcium/PTH after his vit D levels normalize. ?

## 2021-12-19 NOTE — Assessment & Plan Note (Signed)
Improved and at goal. Continue maxide and increased dose of amlodipine.  ?

## 2021-12-19 NOTE — Assessment & Plan Note (Signed)
Lab Results  ?Component Value Date  ? HGBA1C 6.9 (H) 11/30/2021  ? HGBA1C 7.6 (H) 12/18/2020  ? HGBA1C 7.2 (H) 11/23/2019  ? ?Lab Results  ?Component Value Date  ? MICROALBUR 1.3 08/21/2015  ? LDLCALC 38 11/30/2021  ? CREATININE 1.37 11/30/2021  ? ?Follow up A1C is at goal. He is trying to exercise more and eat healthier.  ?

## 2021-12-19 NOTE — Assessment & Plan Note (Signed)
New. He is managing with ibuprofen prn and will let us know if symptoms worsen.  ?

## 2021-12-20 DIAGNOSIS — R972 Elevated prostate specific antigen [PSA]: Secondary | ICD-10-CM | POA: Diagnosis not present

## 2021-12-20 DIAGNOSIS — R351 Nocturia: Secondary | ICD-10-CM | POA: Diagnosis not present

## 2021-12-20 DIAGNOSIS — R3912 Poor urinary stream: Secondary | ICD-10-CM | POA: Diagnosis not present

## 2021-12-24 ENCOUNTER — Other Ambulatory Visit: Payer: Self-pay | Admitting: Urology

## 2021-12-24 DIAGNOSIS — R972 Elevated prostate specific antigen [PSA]: Secondary | ICD-10-CM

## 2022-01-13 ENCOUNTER — Other Ambulatory Visit: Payer: Self-pay | Admitting: Family Medicine

## 2022-02-19 ENCOUNTER — Ambulatory Visit
Admission: RE | Admit: 2022-02-19 | Discharge: 2022-02-19 | Disposition: A | Discharge status: Home / Self Care | Payer: BC Managed Care – PPO | Source: Ambulatory Visit | Attending: Urology | Admitting: Urology

## 2022-02-19 DIAGNOSIS — R972 Elevated prostate specific antigen [PSA]: Secondary | ICD-10-CM | POA: Diagnosis not present

## 2022-02-19 MED ORDER — GADOBENATE DIMEGLUMINE 529 MG/ML IV SOLN
20.0000 mL | Freq: Once | INTRAVENOUS | Status: AC | PRN
  Administered 2022-02-19: 20 mL via INTRAVENOUS

## 2022-02-28 DIAGNOSIS — C61 Malignant neoplasm of prostate: Secondary | ICD-10-CM | POA: Diagnosis not present

## 2022-02-28 DIAGNOSIS — R972 Elevated prostate specific antigen [PSA]: Secondary | ICD-10-CM | POA: Diagnosis not present

## 2022-03-02 ENCOUNTER — Other Ambulatory Visit: Payer: Self-pay | Admitting: Family

## 2022-03-07 DIAGNOSIS — C61 Malignant neoplasm of prostate: Secondary | ICD-10-CM | POA: Diagnosis not present

## 2022-03-18 ENCOUNTER — Encounter: Payer: Self-pay | Admitting: Family

## 2022-03-18 DIAGNOSIS — E559 Vitamin D deficiency, unspecified: Secondary | ICD-10-CM

## 2022-03-18 DIAGNOSIS — E119 Type 2 diabetes mellitus without complications: Secondary | ICD-10-CM

## 2022-03-18 NOTE — Telephone Encounter (Signed)
Orders placed. Please contact pt to schedule a lab appointment.

## 2022-03-20 ENCOUNTER — Other Ambulatory Visit (INDEPENDENT_AMBULATORY_CARE_PROVIDER_SITE_OTHER): Payer: BC Managed Care – PPO

## 2022-03-20 ENCOUNTER — Telehealth: Payer: Self-pay | Admitting: Family

## 2022-03-20 DIAGNOSIS — E119 Type 2 diabetes mellitus without complications: Secondary | ICD-10-CM | POA: Diagnosis not present

## 2022-03-20 DIAGNOSIS — E559 Vitamin D deficiency, unspecified: Secondary | ICD-10-CM

## 2022-03-20 LAB — COMPREHENSIVE METABOLIC PANEL
ALT: 58 U/L — ABNORMAL HIGH (ref 0–53)
AST: 34 U/L (ref 0–37)
Albumin: 4.3 g/dL (ref 3.5–5.2)
Alkaline Phosphatase: 87 U/L (ref 39–117)
BUN: 21 mg/dL (ref 6–23)
CO2: 28 mEq/L (ref 19–32)
Calcium: 10.7 mg/dL — ABNORMAL HIGH (ref 8.4–10.5)
Chloride: 97 mEq/L (ref 96–112)
Creatinine, Ser: 1.5 mg/dL (ref 0.40–1.50)
GFR: 51.56 mL/min — ABNORMAL LOW (ref >60.00)
Glucose, Bld: 227 mg/dL — ABNORMAL HIGH (ref 70–99)
Potassium: 3.8 mEq/L (ref 3.5–5.1)
Sodium: 134 mEq/L — ABNORMAL LOW (ref 135–145)
Total Bilirubin: 1.6 mg/dL — ABNORMAL HIGH (ref 0.2–1.2)
Total Protein: 6.7 g/dL (ref 6.0–8.3)

## 2022-03-20 LAB — VITAMIN D 25 HYDROXY (VIT D DEFICIENCY, FRACTURES): VITD: 32.75 ng/mL (ref 30.00–100.00)

## 2022-03-20 LAB — HEMOGLOBIN A1C: Hgb A1c MFr Bld: 8.8 % — ABNORMAL HIGH (ref 4.6–6.5)

## 2022-03-20 NOTE — Telephone Encounter (Signed)
Opened in error

## 2022-03-25 ENCOUNTER — Ambulatory Visit (INDEPENDENT_AMBULATORY_CARE_PROVIDER_SITE_OTHER): Payer: BC Managed Care – PPO | Admitting: Family

## 2022-03-25 ENCOUNTER — Encounter: Payer: Self-pay | Admitting: Family

## 2022-03-25 VITALS — BP 123/70 | HR 62 | Temp 98.1°F | Resp 16 | Wt 217.0 lb

## 2022-03-25 DIAGNOSIS — E213 Hyperparathyroidism, unspecified: Secondary | ICD-10-CM | POA: Diagnosis not present

## 2022-03-25 DIAGNOSIS — E119 Type 2 diabetes mellitus without complications: Secondary | ICD-10-CM

## 2022-03-25 DIAGNOSIS — R7989 Other specified abnormal findings of blood chemistry: Secondary | ICD-10-CM | POA: Diagnosis not present

## 2022-03-25 DIAGNOSIS — I10 Essential (primary) hypertension: Secondary | ICD-10-CM | POA: Diagnosis not present

## 2022-03-25 DIAGNOSIS — C61 Malignant neoplasm of prostate: Secondary | ICD-10-CM

## 2022-03-25 DIAGNOSIS — N289 Disorder of kidney and ureter, unspecified: Secondary | ICD-10-CM

## 2022-03-25 DIAGNOSIS — Z720 Tobacco use: Secondary | ICD-10-CM

## 2022-03-25 MED ORDER — SITAGLIPTIN PHOSPHATE 100 MG PO TABS: 100.0000 mg | ORAL_TABLET | Freq: Every day | ORAL | 5 refills | Status: DC

## 2022-03-25 NOTE — Assessment & Plan Note (Signed)
Reports low grade prostate cancer which is being followed by urology with surveillance for now.

## 2022-03-26 LAB — PTH, INTACT AND CALCIUM
Calcium: 10.8 mg/dL — ABNORMAL HIGH (ref 8.6–10.3)
PTH: 60 pg/mL (ref 16–77)

## 2022-03-27 ENCOUNTER — Telehealth: Payer: Self-pay | Admitting: Family

## 2022-03-27 NOTE — Telephone Encounter (Signed)
Please contact pt and let him know that his parathyroid test has returned to normal, however his calcium is still mildly elevated.  I reviewed his labs with Dr. Charlett Blake and she recommended that we see if insurance will cover a bone scan to look for any bone abnormalities given his history of cancer. I have placed the order and he should be contacted.

## 2022-03-28 NOTE — Telephone Encounter (Signed)
Patient advised of results and he is already scheduled for the scan

## 2022-04-10 ENCOUNTER — Encounter (HOSPITAL_COMMUNITY)
Admission: RE | Admit: 2022-04-10 | Discharge: 2022-04-10 | Disposition: A | Discharge status: Home / Self Care | Payer: BC Managed Care – PPO | Source: Ambulatory Visit | Attending: Family | Admitting: Family

## 2022-04-10 DIAGNOSIS — M19011 Primary osteoarthritis, right shoulder: Secondary | ICD-10-CM | POA: Diagnosis not present

## 2022-04-10 DIAGNOSIS — C61 Malignant neoplasm of prostate: Secondary | ICD-10-CM | POA: Diagnosis not present

## 2022-04-10 DIAGNOSIS — M170 Bilateral primary osteoarthritis of knee: Secondary | ICD-10-CM | POA: Diagnosis not present

## 2022-04-10 MED ORDER — TECHNETIUM TC 99M MEDRONATE IV KIT
20.0000 | PACK | Freq: Once | INTRAVENOUS | Status: AC | PRN
  Administered 2022-04-10: 21 via INTRAVENOUS

## 2022-04-14 ENCOUNTER — Encounter: Payer: Self-pay | Admitting: Family

## 2022-04-17 ENCOUNTER — Ambulatory Visit: Payer: BC Managed Care – PPO | Admitting: Family

## 2022-04-17 VITALS — BP 129/67 | HR 61 | Temp 98.0°F | Resp 16 | Wt 220.0 lb

## 2022-04-17 DIAGNOSIS — R21 Rash and other nonspecific skin eruption: Secondary | ICD-10-CM | POA: Diagnosis not present

## 2022-04-17 DIAGNOSIS — E119 Type 2 diabetes mellitus without complications: Secondary | ICD-10-CM | POA: Diagnosis not present

## 2022-04-17 NOTE — Assessment & Plan Note (Signed)
New.  Rash really looks more like ringworm/fungal rash to me than drug rash.  Recommended trial of topical lotrimin bid.

## 2022-04-17 NOTE — Assessment & Plan Note (Addendum)
Uncontrolled. While I am not convinced that [place] was the cause for rash, will not plan to retrial. We discussed possibility of adding actos or mounjaro.  He wishes to think about it and will let me know what he decides via mychart. In the meantime, he will continue to work hard on his diet.

## 2022-04-17 NOTE — Patient Instructions (Signed)
Let me know if you would like to start actos or Mounjaro.  Continue your clean eating.  Apply lotrimin cream twice daily to affected areas.

## 2022-04-17 NOTE — Progress Notes (Signed)
Subjective:   By signing my name below, I, Shehryar Baig, attest that this documentation has been prepared under the direction and in the presence of Debbrah Alar, NP. 04/17/2022    Patient ID: Steve Beasley, male    DOB: [birth year], 56 y.o.   MRN: [id number]  Chief Complaint  Patient presents with   Rash    Patient reports rash after starting Januvia    Patient is in today for a office visit.   Rash- He reports developing itching rash on his left arm, left leg, and left side torso shortly after starting Januvia. He found his rashes has improved since he first discovered them.   Blood sugar- His last blood sugar levels on 03/20/2022 was 227. He reports improving his diet since he stopped Januvia. He continues taking 2 mg glimepiride 2x daily PO and 1000 mg metformin 2x daily PO. He was recommended to try actose and mounjaro to help manage his blood sugars as well. He will decide at a later time which medications he is willing to take.   Lab Results  Component Value Date   HGBA1C 8.8 (H) 03/20/2022    Health Maintenance Due  Topic Date Due   HIV Screening  Never done   Zoster Vaccines- Shingrix (1 of 2) Never done   OPHTHALMOLOGY EXAM  10/04/2016   COVID-19 Vaccine (4 - Booster for Pfizer series) 10/20/2020    Past Medical History:  Diagnosis Date   Allergy-induced asthma    Anxiety state, unspecified 09/19/2013   Atypical chest pain 11/13/2014   Basal cell carcinoma of scalp 09/09/2016   and back   Diabetes mellitus without complication ([place])    Fatty liver disease, nonalcoholic 10/62/6948   Gout 09/18/2014   Hypertension    Melanoma ([place])    left upper arm   Nicotine use disorder 06/06/2013   smokeless   Obesity, unspecified 06/06/2013   Osteopenia    Other and unspecified hyperlipidemia 01/19/2014   Preventative health care 09/19/2013   Prostate cancer ([place]) 12/26/2020   Right heart enlargement 09/18/2014   Testicular cancer ([place])    Tinnitus 03/24/2017    Valvular heart disease 09/18/2014    Past Surgical History:  Procedure Laterality Date   BASAL CELL CARCINOMA EXCISION     Within 6 months   COLONOSCOPY     POLYPECTOMY     SKIN CANCER EXCISION     Within 6 months   testicular cancer surgery      Family History  Problem Relation Age of Onset   Diabetes Mother    Breast cancer Mother    Bone cancer Mother    Cancer Mother        breast with bone mets   Depression Mother    Hypertension Father    Prostate cancer Father    Hyperlipidemia Father    Cancer Father        prostate   Colon polyps Father    Stroke Paternal Grandfather    Prostate cancer Maternal Grandfather    Hypertension Brother    Gallstones Brother    Anxiety disorder Daughter    ADD / ADHD Daughter    Bulemia Daughter    Depression Son    Stroke Maternal Uncle 4   Anxiety disorder Maternal Aunt        social anxiety, anger   Heart attack Neg Hx    Sudden death Neg Hx    Colon cancer Neg Hx    Stomach cancer Neg Hx  Rectal cancer Neg Hx    Esophageal cancer Neg Hx     Social History   Socioeconomic History   Marital status: Married    Spouse name: Not on file   Number of children: 2   Years of education: Not on file   Highest education level: Not on file  Occupational History   Occupation: finance  Tobacco Use   Smoking status: Former    Types: Cigarettes   Smokeless tobacco: Current    Types: Snuff, Chew    Last attempt to quit: 02/28/2014   Tobacco comments:    30 years ago socially/ 06/10/19 pt  uses oral nicotine chew tobacco free   Substance and Sexual Activity   Alcohol use: Yes    Alcohol/week: 0.0 standard drinks of alcohol    Comment: 2 drinks per day varies    Drug use: No   Sexual activity: Yes    Comment: lives with wife, travels with work, no dietary restrictions , exercise regular  Other Topics Concern   Not on file  Social History Narrative   Not on file   Social Determinants of Health   Financial Resource  Strain: Not on file  Food Insecurity: Not on file  Transportation Needs: Not on file  Physical Activity: Not on file  Stress: Not on file  Social Connections: Not on file  Intimate Partner Violence: Not on file    Outpatient Medications Prior to Visit  Medication Sig Dispense Refill   allopurinol (ZYLOPRIM) 100 MG tablet TAKE 1 TABLET(100 MG) BY MOUTH TWICE DAILY 180 tablet 1   amLODipine (NORVASC) 10 MG tablet TAKE 1 TABLET(10 MG) BY MOUTH DAILY 90 tablet 0   atorvastatin (LIPITOR) 20 MG tablet TAKE 1 TABLET(20 MG) BY MOUTH DAILY 90 tablet 1   glimepiride (AMARYL) 2 MG tablet Take 1 tablet twice daily with a meal. 180 tablet 1   glucose blood (ONETOUCH ULTRA) test strip USE TO CHECK BLOOD SUGAR DAILY AS DIRECTED. 100 strip 0   losartan (COZAAR) 100 MG tablet TAKE 1 TABLET(100 MG) BY MOUTH DAILY 90 tablet 1   metFORMIN (GLUCOPHAGE) 1000 MG tablet Take 1 tablet (1,000 mg total) by mouth 2 (two) times daily with a meal. 180 tablet 1   triamterene-hydrochlorothiazide (MAXZIDE-25) 37.5-25 MG tablet Take 1 tablet by mouth daily. 90 tablet 1   VITAMIN D PO Take 3,000 Units by mouth daily.     Vitamin D, Ergocalciferol, (DRISDOL) 1.25 MG (50000 UNIT) CAPS capsule Take 1 capsule (50,000 Units total) by mouth every 7 (seven) days. 12 capsule 0   sitaGLIPtin (JANUVIA) 100 MG tablet Take 1 tablet (100 mg total) by mouth daily. (Patient not taking: Reported on 04/17/2022) 30 tablet 5   No facility-administered medications prior to visit.    Allergies  Allergen Reactions   Januvia [Sitagliptin] Rash    Review of Systems  Skin:  Positive for itching (on rash sites) and rash (on left leg, left arm, and left side torso).       Objective:    Physical Exam Constitutional:      General: He is not in acute distress.    Appearance: Normal appearance. He is not ill-appearing.  HENT:     Head: Normocephalic and atraumatic.     Right Ear: External ear normal.     Left Ear: External ear normal.   Eyes:     Extraocular Movements: Extraocular movements intact.     Pupils: Pupils are equal, round, and reactive to light.  Cardiovascular:     Rate and Rhythm: Normal rate.  Pulmonary:     Effort: Pulmonary effort is normal.  Skin:    General: Skin is warm and dry.     Comments: Annular rash on left forearm, left torso, and left thigh.   Neurological:     Mental Status: He is alert and oriented to person, place, and time.  Psychiatric:        Judgment: Judgment normal.      BP 129/67 (BP Location: Right Arm, Patient Position: Sitting, Cuff Size: Small)   Pulse 61   Temp 98 F (36.7 C) (Oral)   Resp 16   Wt 220 lb (99.8 kg)   SpO2 100%   BMI 30.68 kg/m  Wt Readings from Last 3 Encounters:  04/17/22 220 lb (99.8 kg)  03/25/22 217 lb (98.4 kg)  12/19/21 215 lb (97.5 kg)       Assessment & Plan:   Problem List Items Addressed This Visit       Unprioritized   Skin rash - Primary    New.  Rash really looks more like ringworm/fungal rash to me than drug rash.  Recommended trial of topical lotrimin bid.        DM (diabetes mellitus), type 2 ([facility])    Uncontrolled. While I am not convinced that [place] was the cause for rash, will not plan to retrial. We discussed possibility of adding actos or mounjaro.  He wishes to think about it and will let me know what he decides via mychart. In the meantime, he will continue to work hard on his diet.          No orders of the defined types were placed in this encounter.   I, Nance Pear, NP, personally preformed the services described in this documentation.  All medical record entries made by the scribe were at my direction and in my presence.  I have reviewed the chart and discharge instructions (if applicable) and agree that the record reflects my personal performance and is accurate and complete. 04/17/2022   I,Shehryar Baig,acting as a Education administrator for Nance Pear, NP.,have documented all relevant  documentation on the behalf of Nance Pear, NP,as directed by  Nance Pear, NP while in the presence of Nance Pear, NP.   Nance Pear, NP

## 2022-05-08 ENCOUNTER — Encounter: Payer: Self-pay | Admitting: Family

## 2022-05-08 MED ORDER — TIRZEPATIDE 2.5 MG/0.5ML ~~LOC~~ SOAJ
2.5000 mg | SUBCUTANEOUS | 1 refills | Status: DC
Start: 2022-05-08 — End: 2022-06-24

## 2022-05-18 ENCOUNTER — Encounter: Payer: Self-pay | Admitting: Family

## 2022-05-19 MED ORDER — AMLODIPINE BESYLATE 10 MG PO TABS: 5.0000 mg | ORAL_TABLET | Freq: Every day | ORAL | 0 refills | Status: DC

## 2022-06-01 ENCOUNTER — Other Ambulatory Visit: Payer: Self-pay | Admitting: Family

## 2022-06-06 DIAGNOSIS — D225 Melanocytic nevi of trunk: Secondary | ICD-10-CM | POA: Diagnosis not present

## 2022-06-06 DIAGNOSIS — L821 Other seborrheic keratosis: Secondary | ICD-10-CM | POA: Diagnosis not present

## 2022-06-06 DIAGNOSIS — D485 Neoplasm of uncertain behavior of skin: Secondary | ICD-10-CM | POA: Diagnosis not present

## 2022-06-06 DIAGNOSIS — D229 Melanocytic nevi, unspecified: Secondary | ICD-10-CM | POA: Diagnosis not present

## 2022-06-06 DIAGNOSIS — L814 Other melanin hyperpigmentation: Secondary | ICD-10-CM | POA: Diagnosis not present

## 2022-06-06 DIAGNOSIS — C44519 Basal cell carcinoma of skin of other part of trunk: Secondary | ICD-10-CM | POA: Diagnosis not present

## 2022-06-22 ENCOUNTER — Encounter: Payer: Self-pay | Admitting: Family

## 2022-06-24 MED ORDER — TIRZEPATIDE 5 MG/0.5ML ~~LOC~~ SOAJ
5.0000 mg | SUBCUTANEOUS | 0 refills | Status: DC
Start: 2022-06-24 — End: 2022-07-12

## 2022-07-12 ENCOUNTER — Telehealth: Payer: Self-pay | Admitting: Family Medicine

## 2022-07-12 ENCOUNTER — Other Ambulatory Visit: Payer: Self-pay | Admitting: Family

## 2022-07-12 DIAGNOSIS — E119 Type 2 diabetes mellitus without complications: Secondary | ICD-10-CM

## 2022-07-12 MED ORDER — TIRZEPATIDE 5 MG/0.5ML ~~LOC~~ SOAJ
5.0000 mg | SUBCUTANEOUS | 1 refills | Status: DC
Start: 2022-07-12 — End: 2022-09-24

## 2022-07-12 NOTE — Telephone Encounter (Signed)
Pt called stating that he would like to have lab work done prior to his appt on 10.20.23 so that can be gone over in his appt and he can get his meds refilled.

## 2022-07-12 NOTE — Telephone Encounter (Signed)
Lvm to sched.

## 2022-07-12 NOTE — Telephone Encounter (Signed)
Lab orders placed. Please contact pt to schedule a lab appointment.

## 2022-07-15 ENCOUNTER — Telehealth: Payer: Self-pay

## 2022-07-15 NOTE — Telephone Encounter (Signed)
Called pt Lvm to call back to make lab appt

## 2022-07-15 NOTE — Telephone Encounter (Signed)
Caller Name [facility] Phone Number [phone] Patient Name Steve Beasley Patient DOB [birth year] Call Type Message Only Information Provided Reason for Call Request to Schedule Office Appointment Initial Comment Caller states he is needing to book a lab apt. Patient request to speak to RN No Disp. Time Disposition Final User 07/12/2022 6:01:36 PM General Information Provided Yes Janice Norrie Call Closed By: Janice Norrie Transaction Date/Time: 07/12/2022 5:58:51 PM (ET)

## 2022-07-18 ENCOUNTER — Other Ambulatory Visit (INDEPENDENT_AMBULATORY_CARE_PROVIDER_SITE_OTHER): Payer: BC Managed Care – PPO

## 2022-07-18 ENCOUNTER — Other Ambulatory Visit: Payer: Self-pay | Admitting: Family

## 2022-07-18 DIAGNOSIS — E119 Type 2 diabetes mellitus without complications: Secondary | ICD-10-CM | POA: Diagnosis not present

## 2022-07-18 LAB — COMPREHENSIVE METABOLIC PANEL
ALT: 34 U/L (ref 0–53)
AST: 24 U/L (ref 0–37)
Albumin: 4.7 g/dL (ref 3.5–5.2)
Alkaline Phosphatase: 86 U/L (ref 39–117)
BUN: 21 mg/dL (ref 6–23)
CO2: 26 mEq/L (ref 19–32)
Calcium: 11.1 mg/dL — ABNORMAL HIGH (ref 8.4–10.5)
Chloride: 92 mEq/L — ABNORMAL LOW (ref 96–112)
Creatinine, Ser: 1.29 mg/dL (ref 0.40–1.50)
GFR: 61.65 mL/min (ref >60.00)
Glucose, Bld: 120 mg/dL — ABNORMAL HIGH (ref 70–99)
Potassium: 3.6 mEq/L (ref 3.5–5.1)
Sodium: 128 mEq/L — ABNORMAL LOW (ref 135–145)
Total Bilirubin: 2 mg/dL — ABNORMAL HIGH (ref 0.2–1.2)
Total Protein: 7 g/dL (ref 6.0–8.3)

## 2022-07-18 LAB — HEMOGLOBIN A1C: Hgb A1c MFr Bld: 6.6 % — ABNORMAL HIGH (ref 4.6–6.5)

## 2022-07-19 ENCOUNTER — Ambulatory Visit: Payer: BC Managed Care – PPO | Admitting: Family

## 2022-07-19 DIAGNOSIS — E871 Hypo-osmolality and hyponatremia: Secondary | ICD-10-CM | POA: Diagnosis not present

## 2022-07-19 DIAGNOSIS — Z23 Encounter for immunization: Secondary | ICD-10-CM | POA: Diagnosis not present

## 2022-07-19 DIAGNOSIS — E119 Type 2 diabetes mellitus without complications: Secondary | ICD-10-CM | POA: Diagnosis not present

## 2022-07-19 DIAGNOSIS — R17 Unspecified jaundice: Secondary | ICD-10-CM | POA: Diagnosis not present

## 2022-07-19 NOTE — Progress Notes (Shared)
Subjective:   By signing my name below, I, Steve Beasley, attest that this documentation has been prepared under the direction and in the presence of Karie Chimera, NP 07/19/2022   Patient ID: Steve Beasley, male    DOB: [birth year], 56 y.o.   MRN: [id number]  No chief complaint on file.   HPI Patient is in today for an office visit   A1C: His A1C levels decreased from 8.8 to 6.6 as of today's visit Lab Results  Component Value Date   HGBA1C 6.6 (H) 07/18/2022   Sodium/Blood Pressure: His sodium levels are decreasing. He is currently taking 37.5-25 Mg of Maxzide-25. He states that he was originally taking the medication for blood pressure. He also states that his blood pressure is improving since starting the 5 Mg/0.5 mL of Mounjaro. He notes that he lost a total of about 16 lbs (he contributes some weight loss to his experience with Covid). He is also taking a 1/2 tablet of 10 Mg of Amlodipine BP Readings from Last 3 Encounters:  07/19/22 123/77  04/17/22 129/67  03/25/22 123/70   Pulse Readings from Last 3 Encounters:  07/19/22 80  04/17/22 61  03/25/22 62   Wt Readings from Last 3 Encounters:  07/19/22 196 lb (88.9 kg)  04/17/22 220 lb (99.8 kg)  03/25/22 217 lb (98.4 kg)   Calcium: His calcium levels are increasing from 10.8 to 11.1. He has been to an endocrinologist in the past and reports that he has undergone a 24 Hr urine test and parathyroid blood test and both results were normal. He notes that he was diagnosed with low grade prostate cancer and knows that cancer could contribute to his calcium levels. Lab Results  Component Value Date   PTH 60 03/25/2022   CALCIUM 11.1 (H) 07/18/2022   CAION CANCELED 12/18/2020   PHOS 2.3 10/24/2014   Immunizations: He is interested in receiving an influenza vaccine during today's visit. He states that he wants to wait to get his Covid-19 vaccine due to recent exposure  Health Maintenance Due  Topic Date Due   HIV  Screening  Never done   Zoster Vaccines- Shingrix (1 of 2) Never done   Diabetic kidney evaluation - Urine ACR  08/20/2016   OPHTHALMOLOGY EXAM  10/04/2016   COVID-19 Vaccine (4 - Pfizer risk series) 10/20/2020   INFLUENZA VACCINE  04/30/2022   COLONOSCOPY (Pts 45-32yr Insurance coverage will need to be confirmed)  07/07/2022    Past Medical History:  Diagnosis Date   Allergy-induced asthma    Anxiety state, unspecified 09/19/2013   Atypical chest pain 11/13/2014   Basal cell carcinoma of scalp 09/09/2016   and back   Di[place]    Fatty liver disease, nonalcoholic 124/40/1027  Gout 09/18/2014   Hypertension    [place]    [place]   Nicotine use disorder 06/06/2013   smokeless   Obesity, unspecified 06/06/2013   Osteopenia    Other and unspecified hyperlipidemia 01/19/2014   Preventative health care 09/19/2013   [place] cancer ([place] 12/26/2020   Right heart enlargement 09/18/2014   [place]    Tinnitus 03/24/2017   Valvular heart disease 09/18/2014    Past Surgical History:  Procedure Laterality Date   BASAL CELL CARCINOMA EXCISION     Within 6 months   COLONOSCOPY     POLYPECTOMY     SKIN CANCER EXCISION     Within 6 months  testicular cancer surgery      Family History  Problem Relation Age of Onset   Diabetes Mother    Breast cancer Mother    Bone cancer Mother    Cancer Mother        breast with bone mets   Depression Mother    Hypertension Father    Prostate cancer Father    Hyperlipidemia Father    Cancer Father        prostate   Colon polyps Father    Stroke Paternal Grandfather    Prostate cancer Maternal Grandfather    Hypertension Brother    Gallstones Brother    Anxiety disorder Daughter    ADD / ADHD Daughter    Bulemia Daughter    Depression Son    Stroke Maternal Uncle 7   Anxiety disorder Maternal Aunt        social anxiety, anger   Heart attack Neg Hx    Sudden death Neg  Hx    Colon cancer Neg Hx    Stomach cancer Neg Hx    Rectal cancer Neg Hx    Esophageal cancer Neg Hx     Social History   Socioeconomic History   Marital status: Married    Spouse name: Not on file   Number of children: 2   Years of education: Not on file   Highest education level: Not on file  Occupational History   Occupation: finance  Tobacco Use   Smoking status: Former    Types: Cigarettes   Smokeless tobacco: Current    Types: Snuff, Chew    Last attempt to quit: 02/28/2014   Tobacco comments:    30 years ago socially/ 06/10/19 pt  uses oral nicotine chew tobacco free   Substance and Sexual Activity   Alcohol use: Yes    Alcohol/week: 0.0 standard drinks of alcohol    Comment: 2 drinks per day varies    Drug use: No   Sexual activity: Yes    Comment: lives with wife, travels with work, no dietary restrictions , exercise regular  Other Topics Concern   Not on file  Social History Narrative   Not on file   Social Determinants of Health   Financial Resource Strain: Not on file  Food Insecurity: Not on file  Transportation Needs: Not on file  Physical Activity: Not on file  Stress: Not on file  Social Connections: Not on file  Intimate Partner Violence: Not on file    Outpatient Medications Prior to Visit  Medication Sig Dispense Refill   allopurinol (ZYLOPRIM) 100 MG tablet TAKE 1 TABLET(100 MG) BY MOUTH TWICE DAILY 180 tablet 1   amLODipine (NORVASC) 10 MG tablet TAKE 1 TABLET(10 MG) BY MOUTH DAILY 90 tablet 0   atorvastatin (LIPITOR) 20 MG tablet TAKE 1 TABLET(20 MG) BY MOUTH DAILY 90 tablet 1   glucose blood (ONETOUCH ULTRA) test strip USE TO CHECK BLOOD SUGAR DAILY AS DIRECTED. 100 strip 0   losartan (COZAAR) 100 MG tablet TAKE 1 TABLET(100 MG) BY MOUTH DAILY 90 tablet 1   metFORMIN (GLUCOPHAGE) 1000 MG tablet Take 1 tablet (1,000 mg total) by mouth 2 (two) times daily with a meal. 180 tablet 1   tirzepatide (MOUNJARO) 5 MG/0.5ML Pen Inject 5 mg into the  skin once a week. 2 mL 1   triamterene-hydrochlorothiazide (MAXZIDE-25) 37.5-25 MG tablet Take 1 tablet by mouth daily. 90 tablet 1   VITAMIN D PO Take 3,000 Units by mouth daily.  Vitamin D, Ergocalciferol, (DRISDOL) 1.25 MG (50000 UNIT) CAPS capsule Take 1 capsule (50,000 Units total) by mouth every 7 (seven) days. 12 capsule 0   No facility-administered medications prior to visit.    Allergies  Allergen Reactions   Januvia [Sitagliptin] Rash    ROS     Objective:    Physical Exam Constitutional:      General: He is not in acute distress.    Appearance: Normal appearance. He is not ill-appearing.  HENT:     Head: Normocephalic and atraumatic.     Right Ear: External ear normal.     Left Ear: External ear normal.  Eyes:     Extraocular Movements: Extraocular movements intact.     Pupils: Pupils are equal, round, and reactive to light.  Cardiovascular:     Rate and Rhythm: Normal rate and regular rhythm.     Heart sounds: Normal heart sounds. No murmur heard.    No gallop.  Pulmonary:     Effort: Pulmonary effort is normal. No respiratory distress.     Breath sounds: Normal breath sounds. No wheezing or rales.  Skin:    General: Skin is warm and dry.  Neurological:     Mental Status: He is alert and oriented to person, place, and time.  Psychiatric:        Mood and Affect: Mood normal.        Behavior: Behavior normal.        Judgment: Judgment normal.     There were no vitals taken for this visit. Wt Readings from Last 3 Encounters:  04/17/22 220 lb (99.8 kg)  03/25/22 217 lb (98.4 kg)  12/19/21 215 lb (97.5 kg)       Assessment & Plan:   Problem List Items Addressed This Visit   None  No orders of the defined types were placed in this encounter.   I, Steve Beasley, personally preformed the services described in this documentation.  All medical record entries made by the scribe were at my direction and in my presence.  I have reviewed the chart and  discharge instructions (if applicable) and agree that the record reflects my personal performance and is accurate and complete. 07/19/2022   I,Amber Collins,acting as a scribe for Nance Pear, NP.,have documented all relevant documentation on the behalf of Nance Pear, NP,as directed by  Nance Pear, NP while in the presence of Nance Pear, NP.    [employer]

## 2022-07-19 NOTE — Patient Instructions (Signed)
Stop maxide. Check blood pressure twice daily. If bp > 150/90, increase amlodipine to '10mg'$  once daily and let us know.

## 2022-07-19 NOTE — Assessment & Plan Note (Signed)
Lab Results  Component Value Date   HGBA1C 6.6 (H) 07/18/2022   HGBA1C 8.8 (H) 03/20/2022   HGBA1C 6.9 (H) 11/30/2021   Lab Results  Component Value Date   MICROALBUR 1.3 08/21/2015   LDLCALC 38 11/30/2021   CREATININE 1.29 07/18/2022   Sugar looks great with mounjaro- continue same dose as well as metformin.

## 2022-07-20 LAB — COMPREHENSIVE METABOLIC PANEL
AG Ratio: 2 (calc) (ref 1.0–2.5)
ALT: 30 U/L (ref 9–46)
AST: 21 U/L (ref 10–35)
Albumin: 4.5 g/dL (ref 3.6–5.1)
Alkaline phosphatase (APISO): 83 U/L (ref 35–144)
BUN/Creatinine Ratio: 18 (calc) (ref 6–22)
BUN: 24 mg/dL (ref 7–25)
CO2: 25 mmol/L (ref 20–32)
Calcium: 10.9 mg/dL — ABNORMAL HIGH (ref 8.6–10.3)
Chloride: 93 mmol/L — ABNORMAL LOW (ref 98–110)
Creat: 1.36 mg/dL — ABNORMAL HIGH (ref 0.70–1.30)
Globulin: 2.2 g/dL (calc) (ref 1.9–3.7)
Glucose, Bld: 156 mg/dL — ABNORMAL HIGH (ref 65–99)
Potassium: 3.7 mmol/L (ref 3.5–5.3)
Sodium: 129 mmol/L — ABNORMAL LOW (ref 135–146)
Total Bilirubin: 1.9 mg/dL — ABNORMAL HIGH (ref 0.2–1.2)
Total Protein: 6.7 g/dL (ref 6.1–8.1)

## 2022-07-20 LAB — PARATHYROID HORMONE, INTACT (NO CA): PTH: 72 pg/mL (ref 16–77)

## 2022-07-21 ENCOUNTER — Telehealth: Payer: Self-pay | Admitting: Family

## 2022-07-21 DIAGNOSIS — R17 Unspecified jaundice: Secondary | ICD-10-CM | POA: Insufficient documentation

## 2022-07-21 DIAGNOSIS — E871 Hypo-osmolality and hyponatremia: Secondary | ICD-10-CM | POA: Insufficient documentation

## 2022-07-21 NOTE — Assessment & Plan Note (Signed)
Repeat bilirubin. Would consider referral to GI.

## 2022-07-21 NOTE — Assessment & Plan Note (Signed)
Will repeat PTH, if elevated, plan referral back to endocrinology.

## 2022-07-21 NOTE — Assessment & Plan Note (Signed)
Sodium is lower than his baseline. Will d/c triamterene hctz.  Plan to repeat bmet in 2 weeks.  In the meantime he will monitor his bp closely at home and  If bp > 150/90, increase amlodipine to '10mg'$  once daily and let us know.

## 2022-07-21 NOTE — Telephone Encounter (Signed)
Please contact pt to schedule a 2 week lab visit appointment.

## 2022-07-21 NOTE — Addendum Note (Signed)
Addended by: Debbrah Alar on: 07/21/2022 11:19 AM   Modules accepted: Orders

## 2022-07-22 NOTE — Telephone Encounter (Signed)
Lvm2 sched  

## 2022-07-27 ENCOUNTER — Ambulatory Visit
Admission: RE | Admit: 2022-07-27 | Discharge: 2022-07-27 | Disposition: A | Discharge status: Home / Self Care | Payer: BC Managed Care – PPO | Source: Ambulatory Visit | Attending: Family Medicine | Admitting: Family Medicine

## 2022-07-27 ENCOUNTER — Telehealth: Payer: Self-pay | Admitting: Emergency Medicine

## 2022-07-27 VITALS — BP 117/77 | HR 69 | Temp 98.8°F | Resp 18 | Ht 71.0 in | Wt 192.0 lb

## 2022-07-27 DIAGNOSIS — L0291 Cutaneous abscess, unspecified: Secondary | ICD-10-CM

## 2022-07-27 MED ORDER — AMOXICILLIN-POT CLAVULANATE 875-125 MG PO TABS: 1.0000 | ORAL_TABLET | Freq: Two times a day (BID) | ORAL | 0 refills | Status: DC

## 2022-07-27 NOTE — Telephone Encounter (Signed)
Call to see if pt could come in earlier- hx of pilonidal cyst 20 years ago. Fraser Din stated he would come in earlier

## 2022-07-27 NOTE — Discharge Instructions (Addendum)
Take antibiotic 2 times a day for 7 days Consider taking a probiotic while on the antibiotic Warm compresses may help There is a wick in the opening that would fall out by tomorrow Call for problems

## 2022-07-27 NOTE — ED Triage Notes (Signed)
Patient c/o possible pilonidal cyst on his left upper butt cheek x 2 weeks.  Seems to be getting worse not better, doing a lot of travel and sitting which is uncomfortable.  Denies any OTC pain meds.

## 2022-07-28 ENCOUNTER — Telehealth: Payer: Self-pay | Admitting: Emergency Medicine

## 2022-07-28 DIAGNOSIS — L0291 Cutaneous abscess, unspecified: Secondary | ICD-10-CM | POA: Diagnosis not present

## 2022-07-28 NOTE — Telephone Encounter (Signed)
Call by this RN to see how Steve Beasley was today - no answer - voice mail left for concerns or questions.

## 2022-07-28 NOTE — ED Provider Notes (Signed)
[facility]    CSN: [id number] Arrival date & time: 07/27/22  1419      History   Chief Complaint Chief Complaint  Patient presents with   Abscess    Believe I have moderate pilonidal cyst.  Painful and traveling later in week so concerned about rupture and worsening infection. - Entered by patient    HPI Steve Beasley is a 56 y.o. male.   HPI  See chief complaint above.  Patient has an abscess on his upper gluteal cleft towards the left buttock.  No trouble with bowels or bladder.  No fever.  Well-controlled diabetes  Past Medical History:  Diagnosis Date   Allergy-induced asthma    Anxiety state, unspecified 09/19/2013   Atypical chest pain 11/13/2014   Basal cell carcinoma of scalp 09/09/2016   and back   Diabetes mellitus without complication ([place])    Fatty liver disease, nonalcoholic 46/27/0350   Gout 09/18/2014   Hypertension    Melanoma ([place])    left upper arm   Nicotine use disorder 06/06/2013   smokeless   Obesity, unspecified 06/06/2013   Osteopenia    Other and unspecified hyperlipidemia 01/19/2014   Preventative health care 09/19/2013   Prostate cancer ([place]) 12/26/2020   Right heart enlargement 09/18/2014   Testicular cancer ([place])    Tinnitus 03/24/2017   Valvular heart disease 09/18/2014    Patient Active Problem List   Diagnosis Date Noted   Elevated bilirubin 07/21/2022   Hyponatremia 07/21/2022   Cervical radiculopathy 12/19/2021   Primary hypertension 01/23/2021   Hyperparathyroidism ([place]) 12/26/2020   Prostate cancer ([place]) 12/26/2020   Hypercalcemia 07/23/2018   Renal insufficiency 05/10/2018   Low vitamin D level 05/10/2018   Thrombocytopenia ([place]) 05/10/2018   Colon polyp 03/23/2018   Tinnitus 03/24/2017   Basal cell carcinoma of scalp 09/09/2016   Skin cancer 02/27/2015   OSA (obstructive sleep apnea) 10/17/2014   Right heart enlargement 09/18/2014   Gout 09/18/2014   Urinary frequency 07/25/2014   Dyspnea 07/25/2014    Hyperlipidemia 01/19/2014   Preventative health care 09/19/2013   Anxiety and depression 09/19/2013   Fatty liver disease, nonalcoholic 09/38/1829   Obesity 06/06/2013   Nicotine use disorder 06/06/2013   Testicular cancer ([place]) 09/12/2012   DM (diabetes mellitus), type 2 ([place]) 09/12/2012   HTN (hypertension) 09/12/2012   Osteopenia 09/12/2012    Past Surgical History:  Procedure Laterality Date   BASAL CELL CARCINOMA EXCISION     Within 6 months   COLONOSCOPY     POLYPECTOMY     SKIN CANCER EXCISION     Within 6 months   testicular cancer surgery         Home Medications    Prior to Admission medications   Medication Sig Start Date End Date Taking? Authorizing Provider  allopurinol (ZYLOPRIM) 100 MG tablet TAKE 1 TABLET(100 MG) BY MOUTH TWICE DAILY 11/30/21  Yes Debbrah Alar, NP  amLODipine (NORVASC) 10 MG tablet TAKE 1 TABLET(10 MG) BY MOUTH DAILY 06/02/22  Yes Debbrah Alar, NP  amoxicillin-clavulanate (AUGMENTIN) 875-125 MG tablet Take 1 tablet by mouth every 12 (twelve) hours. 07/27/22  Yes Raylene Everts, MD  atorvastatin (LIPITOR) 20 MG tablet TAKE 1 TABLET(20 MG) BY MOUTH DAILY 01/14/22  Yes Penni Homans A, MD  glucose blood (ONETOUCH ULTRA) test strip USE TO CHECK BLOOD SUGAR DAILY AS DIRECTED. 07/16/21  Yes Mosie Lukes, MD  losartan (COZAAR) 100 MG tablet TAKE 1 TABLET(100 MG) BY MOUTH DAILY 01/14/22  Yes Mosie Lukes, MD  metFORMIN (GLUCOPHAGE) 1000 MG tablet Take 1 tablet (1,000 mg total) by mouth 2 (two) times daily with a meal. 11/30/21  Yes Debbrah Alar, NP  tirzepatide [facility]) 5 MG/0.5ML Pen Inject 5 mg into the skin once a week. 07/12/22  Yes Mosie Lukes, MD  VITAMIN D PO Take 3,000 Units by mouth daily.   Yes [provider]  Vitamin D, Ergocalciferol, (DRISDOL) 1.25 MG (50000 UNIT) CAPS capsule Take 1 capsule (50,000 Units total) by mouth every 7 (seven) days. 12/08/21  Yes Shelda Pal, DO   triamterene-hydrochlorothiazide (MAXZIDE-25) 37.5-25 MG tablet Take 1 tablet by mouth daily. 11/30/21   Debbrah Alar, NP    Family History Family History  Problem Relation Age of Onset   Diabetes Mother    Breast cancer Mother    Bone cancer Mother    Cancer Mother        breast with bone mets   Depression Mother    Hypertension Father    Prostate cancer Father    Hyperlipidemia Father    Cancer Father        prostate   Colon polyps Father    Stroke Paternal Grandfather    Prostate cancer Maternal Grandfather    Hypertension Brother    Gallstones Brother    Anxiety disorder Daughter    ADD / ADHD Daughter    Bulemia Daughter    Depression Son    Stroke Maternal Uncle 71   Anxiety disorder Maternal Aunt        social anxiety, anger   Heart attack Neg Hx    Sudden death Neg Hx    Colon cancer Neg Hx    Stomach cancer Neg Hx    Rectal cancer Neg Hx    Esophageal cancer Neg Hx     Social History Social History   Tobacco Use   Smoking status: Former    Types: Cigarettes   Smokeless tobacco: Current    Types: Snuff, Chew    Last attempt to quit: 02/28/2014   Tobacco comments:    30 years ago socially/ 06/10/19 pt  uses oral nicotine chew tobacco free   Substance Use Topics   Alcohol use: Yes    Alcohol/week: 0.0 standard drinks of alcohol    Comment: 2 drinks per day varies    Drug use: No     Allergies   Januvia [sitagliptin]   Review of Systems Review of Systems See HPI  Physical Exam Triage Vital Signs ED Triage Vitals  Enc Vitals Group     BP 07/27/22 1429 117/77     Pulse Rate 07/27/22 1429 69     Resp 07/27/22 1429 18     Temp 07/27/22 1429 98.8 F (37.1 C)     Temp Source 07/27/22 1429 Oral     SpO2 07/27/22 1429 100 %     Weight 07/27/22 1431 192 lb (87.1 kg)     Height 07/27/22 1431 '5\' 11"'$  (1.803 m)     Head Circumference --      Peak Flow --      Pain Score 07/27/22 1431 5     Pain Loc --      Pain Edu? --      Excl. in [place]? --     No data found.  Updated Vital Signs BP 117/77 (BP Location: Left Arm)   Pulse 69   Temp 98.8 F (37.1 C) (Oral)   Resp 18   Ht  $'5\' 11"'j$  (1.803 m)   Wt 87.1 kg   SpO2 100%   BMI 26.78 kg/m       Physical Exam Skin:    Findings: Lesion present.      UC Treatments / Results  Labs (all labs ordered are listed, but only abnormal results are displayed) Labs Reviewed - No data to display  EKG   Radiology No results found.  Procedures Incision and Drainage  Date/Time: 07/28/2022 3:47 PM  Performed by: Raylene Everts, MD Authorized by: Raylene Everts, MD   Consent:    Consent obtained:  Verbal   Consent given by:  Patient   Risks discussed:  Incomplete drainage and pain   Alternatives discussed:  No treatment and delayed treatment Universal protocol:    Patient identity confirmed:  Verbally with patient and arm band Location:    Type:  Abscess   Location:  Anogenital   Anogenital location:  Gluteal cleft Pre-procedure details:    Skin preparation:  Antiseptic wash Sedation:    Sedation type:  None Anesthesia:    Anesthesia method:  Local infiltration   Local anesthetic:  Lidocaine 2% WITH epi Procedure type:    Complexity:  Simple Procedure details:    Ultrasound guidance: no     Needle aspiration: yes     Needle size:  18 G   Incision types:  Stab incision   Incision depth:  Subcutaneous   Wound management:  Probed and deloculated and irrigated with saline   Drainage:  Bloody and purulent   Drainage amount:  Moderate   Wound treatment:  Wound left open Post-procedure details:    Procedure completion:  Tolerated well, no immediate complications    Medications Ordered in UC Medications - No data to display  Initial Impression / Assessment and Plan / UC Course  I have reviewed the triage vital signs and the nursing notes.  Pertinent labs & imaging results that were available during my care of the patient were reviewed by me and  considered in my medical decision making (see chart for details).    Final Clinical Impressions(s) / UC Diagnoses   Final diagnoses:  Abscess     Discharge Instructions      Take antibiotic 2 times a day for 7 days Consider taking a probiotic while on the antibiotic Warm compresses may help There is a wick in the opening that would fall out by tomorrow Call for problems   ED Prescriptions     Medication Sig Dispense Auth. Provider   amoxicillin-clavulanate (AUGMENTIN) 875-125 MG tablet Take 1 tablet by mouth every 12 (twelve) hours. 14 tablet Raylene Everts, MD      PDMP not reviewed this encounter.   Raylene Everts, MD 07/28/22 [id number]

## 2022-08-01 ENCOUNTER — Encounter: Payer: Self-pay | Admitting: Family

## 2022-08-09 ENCOUNTER — Other Ambulatory Visit: Payer: BC Managed Care – PPO

## 2022-08-21 ENCOUNTER — Other Ambulatory Visit (INDEPENDENT_AMBULATORY_CARE_PROVIDER_SITE_OTHER): Payer: BC Managed Care – PPO

## 2022-08-21 DIAGNOSIS — E871 Hypo-osmolality and hyponatremia: Secondary | ICD-10-CM

## 2022-08-21 DIAGNOSIS — C61 Malignant neoplasm of prostate: Secondary | ICD-10-CM | POA: Diagnosis not present

## 2022-08-21 LAB — COMPREHENSIVE METABOLIC PANEL
ALT: 48 U/L (ref 0–53)
AST: 30 U/L (ref 0–37)
Albumin: 4.5 g/dL (ref 3.5–5.2)
Alkaline Phosphatase: 111 U/L (ref 39–117)
BUN: 11 mg/dL (ref 6–23)
CO2: 29 mEq/L (ref 19–32)
Calcium: 10.8 mg/dL — ABNORMAL HIGH (ref 8.4–10.5)
Chloride: 99 mEq/L (ref 96–112)
Creatinine, Ser: 1.01 mg/dL (ref 0.40–1.50)
GFR: 82.63 mL/min (ref >60.00)
Glucose, Bld: 199 mg/dL — ABNORMAL HIGH (ref 70–99)
Potassium: 4.4 mEq/L (ref 3.5–5.1)
Sodium: 135 mEq/L (ref 135–145)
Total Bilirubin: 1.8 mg/dL — ABNORMAL HIGH (ref 0.2–1.2)
Total Protein: 6.9 g/dL (ref 6.0–8.3)

## 2022-08-30 ENCOUNTER — Telehealth: Payer: Self-pay | Admitting: *Deleted

## 2022-08-30 NOTE — Telephone Encounter (Signed)
Prior auth started via cover my meds.  Awaiting determination.  Key: [id number]

## 2022-08-31 ENCOUNTER — Other Ambulatory Visit: Payer: Self-pay | Admitting: Family

## 2022-08-31 ENCOUNTER — Other Ambulatory Visit: Payer: Self-pay | Admitting: Family Medicine

## 2022-09-09 DIAGNOSIS — R3912 Poor urinary stream: Secondary | ICD-10-CM | POA: Diagnosis not present

## 2022-09-09 DIAGNOSIS — C61 Malignant neoplasm of prostate: Secondary | ICD-10-CM | POA: Diagnosis not present

## 2022-09-21 ENCOUNTER — Encounter: Payer: Self-pay | Admitting: Family

## 2022-09-21 ENCOUNTER — Other Ambulatory Visit: Payer: Self-pay | Admitting: Family Medicine

## 2022-09-24 ENCOUNTER — Other Ambulatory Visit: Payer: Self-pay | Admitting: Family Medicine

## 2022-09-24 MED ORDER — MOUNJARO 5 MG/0.5ML ~~LOC~~ SOAJ: SUBCUTANEOUS | 2 refills | Status: DC

## 2022-10-10 ENCOUNTER — Other Ambulatory Visit: Payer: Self-pay | Admitting: Family Medicine

## 2022-10-10 ENCOUNTER — Other Ambulatory Visit: Payer: Self-pay | Admitting: Family

## 2022-10-10 DIAGNOSIS — C44519 Basal cell carcinoma of skin of other part of trunk: Secondary | ICD-10-CM | POA: Diagnosis not present

## 2022-10-10 DIAGNOSIS — D235 Other benign neoplasm of skin of trunk: Secondary | ICD-10-CM | POA: Diagnosis not present

## 2022-10-10 DIAGNOSIS — D225 Melanocytic nevi of trunk: Secondary | ICD-10-CM | POA: Diagnosis not present

## 2022-10-16 ENCOUNTER — Other Ambulatory Visit: Payer: Self-pay | Admitting: Family

## 2022-10-21 DIAGNOSIS — K1370 Unspecified lesions of oral mucosa: Secondary | ICD-10-CM | POA: Diagnosis not present

## 2022-10-24 DIAGNOSIS — K136 Irritative hyperplasia of oral mucosa: Secondary | ICD-10-CM | POA: Diagnosis not present

## 2022-11-26 DIAGNOSIS — L814 Other melanin hyperpigmentation: Secondary | ICD-10-CM | POA: Diagnosis not present

## 2022-11-26 DIAGNOSIS — D229 Melanocytic nevi, unspecified: Secondary | ICD-10-CM | POA: Diagnosis not present

## 2022-11-26 DIAGNOSIS — L821 Other seborrheic keratosis: Secondary | ICD-10-CM | POA: Diagnosis not present

## 2022-11-26 DIAGNOSIS — Z85828 Personal history of other malignant neoplasm of skin: Secondary | ICD-10-CM | POA: Diagnosis not present

## 2023-01-09 ENCOUNTER — Other Ambulatory Visit: Payer: Self-pay | Admitting: Family Medicine

## 2023-01-22 ENCOUNTER — Other Ambulatory Visit (HOSPITAL_BASED_OUTPATIENT_CLINIC_OR_DEPARTMENT_OTHER): Payer: Self-pay

## 2023-01-22 ENCOUNTER — Other Ambulatory Visit (HOSPITAL_COMMUNITY): Payer: Self-pay

## 2023-01-22 ENCOUNTER — Encounter: Payer: Self-pay | Admitting: Family

## 2023-01-22 MED ORDER — TIRZEPATIDE 2.5 MG/0.5ML ~~LOC~~ SOAJ
2.5000 mg | SUBCUTANEOUS | 1 refills | Status: DC
  Filled 2023-01-22: qty 2, 28d supply, fill #0

## 2023-02-03 ENCOUNTER — Encounter: Payer: Self-pay | Admitting: Family

## 2023-02-05 ENCOUNTER — Ambulatory Visit: Payer: BC Managed Care – PPO | Admitting: Family

## 2023-02-05 VITALS — BP 142/64 | HR 66 | Temp 98.0°F | Resp 18 | Ht 71.0 in | Wt 189.0 lb

## 2023-02-05 DIAGNOSIS — Z7985 Long-term (current) use of injectable non-insulin antidiabetic drugs: Secondary | ICD-10-CM

## 2023-02-05 DIAGNOSIS — E559 Vitamin D deficiency, unspecified: Secondary | ICD-10-CM

## 2023-02-05 DIAGNOSIS — I10 Essential (primary) hypertension: Secondary | ICD-10-CM

## 2023-02-05 DIAGNOSIS — E119 Type 2 diabetes mellitus without complications: Secondary | ICD-10-CM | POA: Diagnosis not present

## 2023-02-05 DIAGNOSIS — E785 Hyperlipidemia, unspecified: Secondary | ICD-10-CM

## 2023-02-05 DIAGNOSIS — Z1211 Encounter for screening for malignant neoplasm of colon: Secondary | ICD-10-CM

## 2023-02-05 DIAGNOSIS — C61 Malignant neoplasm of prostate: Secondary | ICD-10-CM

## 2023-02-05 LAB — LIPID PANEL
Cholesterol: 80 mg/dL (ref 0–200)
HDL: 36.6 mg/dL — ABNORMAL LOW (ref >39.00)
LDL Cholesterol: 24 mg/dL (ref 0–99)
NonHDL: 43.42
Total CHOL/HDL Ratio: 2
Triglycerides: 95 mg/dL (ref 0.0–149.0)
VLDL: 19 mg/dL (ref 0.0–40.0)

## 2023-02-05 LAB — COMPREHENSIVE METABOLIC PANEL
ALT: 33 U/L (ref 0–53)
AST: 22 U/L (ref 0–37)
Albumin: 4.3 g/dL (ref 3.5–5.2)
Alkaline Phosphatase: 92 U/L (ref 39–117)
BUN: 14 mg/dL (ref 6–23)
CO2: 28 mEq/L (ref 19–32)
Calcium: 10.8 mg/dL — ABNORMAL HIGH (ref 8.4–10.5)
Chloride: 100 mEq/L (ref 96–112)
Creatinine, Ser: 1.15 mg/dL (ref 0.40–1.50)
GFR: 70.49 mL/min (ref >60.00)
Glucose, Bld: 124 mg/dL — ABNORMAL HIGH (ref 70–99)
Potassium: 4.2 mEq/L (ref 3.5–5.1)
Sodium: 136 mEq/L (ref 135–145)
Total Bilirubin: 2.1 mg/dL — ABNORMAL HIGH (ref 0.2–1.2)
Total Protein: 6.7 g/dL (ref 6.0–8.3)

## 2023-02-05 LAB — MICROALBUMIN / CREATININE URINE RATIO
Creatinine,U: 143 mg/dL
Microalb Creat Ratio: 3 mg/g (ref 0.0–30.0)
Microalb, Ur: 4.3 mg/dL — ABNORMAL HIGH (ref 0.0–1.9)

## 2023-02-05 LAB — VITAMIN D 25 HYDROXY (VIT D DEFICIENCY, FRACTURES): VITD: 19.03 ng/mL — ABNORMAL LOW (ref 30.00–100.00)

## 2023-02-05 LAB — HEMOGLOBIN A1C: Hgb A1c MFr Bld: 6.1 % (ref 4.6–6.5)

## 2023-02-05 MED ORDER — VALSARTAN 320 MG PO TABS: 320.0000 mg | ORAL_TABLET | Freq: Every day | ORAL | 0 refills | Status: DC

## 2023-02-05 MED ORDER — TIRZEPATIDE 5 MG/0.5ML ~~LOC~~ SOAJ: 5.0000 mg | SUBCUTANEOUS | 0 refills | Status: DC

## 2023-02-05 NOTE — Progress Notes (Signed)
Subjective:   By signing my name below, I, Barrett Shell, attest that this documentation has been prepared under the direction and in the presence of Sandford Craze, NP. 02/05/2023   Patient ID: Steve Beasley, male    DOB: 04-Jul-1966, 57 y.o.   MRN: [id number]  Chief Complaint  Patient presents with   Medication Refill    HPI Patient is in today for an office visit.   Blood sugars: His blood sugars have been between 160 to 170 fasting and around 110 in the evenings when he checks them at home. He is taking 2.5 mg mounjaro injections weekly. His appetite has increased since taking 2.5 mg instead of 5 mg, but he has not gained any weight on it. He is interested in taking 5 mg mounjaro in the injections again now that they are back in stock. His A1C levels were improved during his last blood work.  Lab Results  Component Value Date   HGBA1C 6.6 (H) 07/18/2022    Blood Pressure: His blood pressure is elevated during this visit. He takes 10 mg amlodipine daily PO and 100 mg losartan daily PO to manage it. BP Readings from Last 3 Encounters:  02/05/23 (!) 142/64  07/27/22 117/77  07/19/22 123/77    Pulse Readings from Last 3 Encounters:  02/05/23 66  07/27/22 69  07/19/22 80    Right knee pain He has intermittent knee pain and burning when he runs. He has been walking more recently.   Vitamin D: He has not been taking vitamin D in the last few months.   Colonoscopy: He is interested in receiving a referral for a colonoscopy.   Vision: He has not had a vision exam in the past year. He plans to schedule.   Past Medical History:  Diagnosis Date   Allergy-induced asthma    Anxiety state, unspecified 09/19/2013   Atypical chest pain 11/13/2014   Basal cell carcinoma of scalp 09/09/2016   and back   Diabetes mellitus without complication (HCC)    Fatty liver disease, nonalcoholic 09/19/2013   Gout 09/18/2014   Hypertension    Melanoma (HCC)    left upper arm   Nicotine  use disorder 06/06/2013   smokeless   Obesity, unspecified 06/06/2013   Osteopenia    Other and unspecified hyperlipidemia 01/19/2014   Preventative health care 09/19/2013   Prostate cancer (HCC) 12/26/2020   Right heart enlargement 09/18/2014   Testicular cancer (HCC)    Tinnitus 03/24/2017   Valvular heart disease 09/18/2014    Past Surgical History:  Procedure Laterality Date   BASAL CELL CARCINOMA EXCISION     Within 6 months   COLONOSCOPY     POLYPECTOMY     SKIN CANCER EXCISION     Within 6 months   testicular cancer surgery      Family History  Problem Relation Age of Onset   Diabetes Mother    Breast cancer Mother    Bone cancer Mother    Cancer Mother        breast with bone mets   Depression Mother    Hypertension Father    Prostate cancer Father    Hyperlipidemia Father    Cancer Father        prostate   Colon polyps Father    Stroke Paternal Grandfather    Prostate cancer Maternal Grandfather    Hypertension Brother    Gallstones Brother    Anxiety disorder Daughter    ADD /  ADHD Daughter    Bulemia Daughter    Depression Son    Stroke Maternal Uncle 64   Anxiety disorder Maternal Aunt        social anxiety, anger   Heart attack Neg Hx    Sudden death Neg Hx    Colon cancer Neg Hx    Stomach cancer Neg Hx    Rectal cancer Neg Hx    Esophageal cancer Neg Hx     Social History   Socioeconomic History   Marital status: Married    Spouse name: Not on file   Number of children: 2   Years of education: Not on file   Highest education level: Not on file  Occupational History   Occupation: finance  Tobacco Use   Smoking status: Former    Types: Cigarettes   Smokeless tobacco: Current    Types: Snuff, Chew    Last attempt to quit: 02/28/2014   Tobacco comments:    30 years ago socially/ 06/10/19 pt  uses oral nicotine chew tobacco free   Substance and Sexual Activity   Alcohol use: Yes    Alcohol/week: 0.0 standard drinks of alcohol    Comment: 2  drinks per day varies    Drug use: No   Sexual activity: Yes    Comment: lives with wife, travels with work, no dietary restrictions , exercise regular  Other Topics Concern   Not on file  Social History Narrative   Not on file   Social Determinants of Health   Financial Resource Strain: Not on file  Food Insecurity: Not on file  Transportation Needs: Not on file  Physical Activity: Not on file  Stress: Not on file  Social Connections: Not on file  Intimate Partner Violence: Not on file    Outpatient Medications Prior to Visit  Medication Sig Dispense Refill   allopurinol (ZYLOPRIM) 100 MG tablet TAKE 1 TABLET(100 MG) BY MOUTH TWICE DAILY 180 tablet 1   amLODipine (NORVASC) 10 MG tablet Take 1 tablet (10 mg total) by mouth daily. 90 tablet 0   atorvastatin (LIPITOR) 20 MG tablet TAKE 1 TABLET(20 MG) BY MOUTH DAILY 90 tablet 1   glucose blood (ONETOUCH ULTRA) test strip USE TO CHECK BLOOD SUGAR DAILY AS DIRECTED. 100 strip 0   metFORMIN (GLUCOPHAGE) 1000 MG tablet TAKE 1 TABLET(1000 MG) BY MOUTH TWICE DAILY WITH A MEAL 180 tablet 1   triamterene-hydrochlorothiazide (MAXZIDE-25) 37.5-25 MG tablet Take 1 tablet by mouth daily. 90 tablet 0   VITAMIN D PO Take 3,000 Units by mouth daily.     losartan (COZAAR) 100 MG tablet TAKE 1 TABLET(100 MG) BY MOUTH DAILY 90 tablet 1   tirzepatide (MOUNJARO) 2.5 MG/0.5ML Pen Inject 2.5 mg into the skin once a week. 2 mL 1   Vitamin D, Ergocalciferol, (DRISDOL) 1.25 MG (50000 UNIT) CAPS capsule Take 1 capsule (50,000 Units total) by mouth every 7 (seven) days. 12 capsule 0   No facility-administered medications prior to visit.    Allergies  Allergen Reactions   Januvia [Sitagliptin] Rash    Review of Systems  Musculoskeletal:  Positive for joint pain (right knee).       Objective:    Physical Exam Constitutional:      General: He is not in acute distress.    Appearance: Normal appearance.  HENT:     Head: Normocephalic and  atraumatic.     Right Ear: External ear normal.     Left Ear: External ear normal.  Eyes:     Extraocular Movements: Extraocular movements intact.     Pupils: Pupils are equal, round, and reactive to light.  Cardiovascular:     Rate and Rhythm: Normal rate and regular rhythm.     Comments: Manual bp recheck 142/64 Pulmonary:     Effort: Pulmonary effort is normal.     Breath sounds: Normal breath sounds.  Skin:    General: Skin is warm.  Neurological:     Mental Status: He is alert and oriented to person, place, and time.  Psychiatric:        Judgment: Judgment normal.     BP (!) 142/64   Pulse 66   Temp 98 F (36.7 C)   Resp 18   Ht 5\' 11"  (1.803 m)   Wt 189 lb (85.7 kg)   SpO2 100%   BMI 26.36 kg/m  Wt Readings from Last 3 Encounters:  02/05/23 189 lb (85.7 kg)  07/27/22 192 lb (87.1 kg)  07/19/22 196 lb (88.9 kg)       Assessment & Plan:  Type 2 diabetes mellitus without complication, without long-term current use of insulin (HCC) Assessment & Plan: He has been doing well. Would like to go back up to mounjaro 5mg  now that his pharmacy has in stock.   Orders: -     Hemoglobin A1c -     Comprehensive metabolic panel -     Microalbumin / creatinine urine ratio  Screening for colon cancer -     Ambulatory referral to Gastroenterology  Hyperlipidemia, unspecified hyperlipidemia type Assessment & Plan: Maintained on atorvastatin 10mg . Obtain follow up lipid panel.   Orders: -     Lipid panel  Vitamin D deficiency -     VITAMIN D 25 Hydroxy (Vit-D Deficiency, Fractures)  Primary hypertension Assessment & Plan: BP slightly above goal. Will change losartan to valsartan.  Continue maxide and amlodipine.    Prostate cancer [facility]) Assessment & Plan: He has an upcoming prostate biopsy pending.    Other orders -     Tirzepatide; Inject 5 mg into the skin once a week.  Dispense: 6 mL; Refill: 0 -     Valsartan; Take 1 tablet (320 mg total) by mouth daily.   Dispense: 90 tablet; Refill: 0    I, Lemont Fillers, NP, personally preformed the services described in this documentation.  All medical record entries made by the scribe were at my direction and in my presence.  I have reviewed the chart and discharge instructions (if applicable) and agree that the record reflects my personal performance and is accurate and complete. 02/05/2023  Lemont Fillers, NP  [facility] as a scribe for Lemont Fillers, NP.,have documented all relevant documentation on the behalf of Lemont Fillers, NP,as directed by  Lemont Fillers, NP while in the presence of Lemont Fillers, NP.

## 2023-02-05 NOTE — Assessment & Plan Note (Signed)
BP slightly above goal. Will change losartan to valsartan.  Continue maxide and amlodipine.

## 2023-02-05 NOTE — Assessment & Plan Note (Signed)
>>  ASSESSMENT AND PLAN FOR HTN (HYPERTENSION) WRITTEN ON 02/05/2023  8:13 AM BY O'SULLIVAN, Vittorio Mohs, NP  BP slightly above goal. Will change losartan to valsartan.  Continue maxide and amlodipine.

## 2023-02-05 NOTE — Assessment & Plan Note (Signed)
He has been doing well. Would like to go back up to mounjaro 5mg  now that his pharmacy has in stock.

## 2023-02-05 NOTE — Assessment & Plan Note (Signed)
He has an upcoming prostate biopsy pending.

## 2023-02-05 NOTE — Assessment & Plan Note (Signed)
Maintained on atorvastatin 10mg . Obtain follow up lipid panel.

## 2023-02-06 ENCOUNTER — Other Ambulatory Visit: Payer: Self-pay

## 2023-02-06 ENCOUNTER — Telehealth: Payer: Self-pay | Admitting: Family

## 2023-02-06 MED ORDER — LOSARTAN POTASSIUM 100 MG PO TABS: 100.0000 mg | ORAL_TABLET | Freq: Every day | ORAL | 1 refills | Status: DC

## 2023-02-06 MED ORDER — ATORVASTATIN CALCIUM 20 MG PO TABS: ORAL_TABLET | ORAL | 1 refills | Status: DC

## 2023-02-06 MED ORDER — METFORMIN HCL 1000 MG PO TABS: ORAL_TABLET | ORAL | 1 refills | Status: DC

## 2023-02-06 MED ORDER — ALLOPURINOL 100 MG PO TABS: ORAL_TABLET | ORAL | 1 refills | Status: AC

## 2023-02-06 NOTE — Telephone Encounter (Signed)
A1C looks great.  Bilirubin, on of the liver tests is usually elevated for  him and likely normal for him, however this time was the highest it has been.  I would like for him to complete some additional bilurbin blood testing please.  Order placed.

## 2023-02-06 NOTE — Telephone Encounter (Signed)
Patient notified of results and provider's comments. He was scheduled to come in for labs tomorrow

## 2023-02-07 ENCOUNTER — Other Ambulatory Visit: Payer: Self-pay | Admitting: *Deleted

## 2023-02-07 ENCOUNTER — Other Ambulatory Visit (INDEPENDENT_AMBULATORY_CARE_PROVIDER_SITE_OTHER): Payer: BC Managed Care – PPO

## 2023-02-08 ENCOUNTER — Encounter: Payer: Self-pay | Admitting: Family

## 2023-02-08 DIAGNOSIS — R17 Unspecified jaundice: Secondary | ICD-10-CM

## 2023-02-08 LAB — BILIRUBIN, FRACTIONATED(TOT/DIR/INDIR)
Bilirubin, Direct: 0.3 mg/dL — ABNORMAL HIGH (ref 0.0–0.2)
Indirect Bilirubin: 1.1 mg/dL (calc) (ref 0.2–1.2)
Total Bilirubin: 1.4 mg/dL — ABNORMAL HIGH (ref 0.2–1.2)

## 2023-02-10 ENCOUNTER — Other Ambulatory Visit: Payer: Self-pay | Admitting: *Deleted

## 2023-02-10 MED ORDER — AMLODIPINE BESYLATE 10 MG PO TABS: 10.0000 mg | ORAL_TABLET | Freq: Every day | ORAL | 1 refills | Status: DC

## 2023-02-11 NOTE — Telephone Encounter (Signed)
-----   Message from Bradd Canary, MD sent at 02/10/2023  9:05 PM EDT ----- Really they are both about the same one slightly above and one slightly below high normal. Hard to know but I think work up is sound idea to be safe. Yes abd [place] and referral makes sense. thanks ----- Message ----- From: Sandford Craze, NP Sent: 02/09/2023   9:13 AM EDT To: Bradd Canary, MD  Good morning,  What are your thoughts on his bilirubin studies.  He has chronic hyperbilirubinemia with direct bili being elevated.  Gilbert's typically has elevated indirect, correct?  So if it is isn't Gilbert's, do you think I should do further work up? Maybe abdominal US and GI referral?    Thanks,  Steve Beasley

## 2023-03-08 ENCOUNTER — Ambulatory Visit (HOSPITAL_BASED_OUTPATIENT_CLINIC_OR_DEPARTMENT_OTHER)
Admission: RE | Admit: 2023-03-08 | Discharge: 2023-03-08 | Disposition: A | Discharge status: Home / Self Care | Payer: BC Managed Care – PPO | Source: Ambulatory Visit | Attending: Family | Admitting: Family

## 2023-03-08 DIAGNOSIS — R17 Unspecified jaundice: Secondary | ICD-10-CM | POA: Insufficient documentation

## 2023-03-17 ENCOUNTER — Other Ambulatory Visit: Payer: Self-pay | Admitting: Family Medicine

## 2023-03-18 DIAGNOSIS — C61 Malignant neoplasm of prostate: Secondary | ICD-10-CM | POA: Diagnosis not present

## 2023-03-25 DIAGNOSIS — C61 Malignant neoplasm of prostate: Secondary | ICD-10-CM | POA: Diagnosis not present

## 2023-03-25 DIAGNOSIS — N411 Chronic prostatitis: Secondary | ICD-10-CM | POA: Diagnosis not present

## 2023-03-31 DIAGNOSIS — C61 Malignant neoplasm of prostate: Secondary | ICD-10-CM | POA: Diagnosis not present

## 2023-04-02 ENCOUNTER — Encounter: Payer: Self-pay | Admitting: Gastroenterology

## 2023-04-23 NOTE — Progress Notes (Signed)
GU Location of Tumor / Histology: Prostate Ca  If Prostate Cancer, Gleason Score is (3 + 4) and PSA is (6.57 on 03/18/2023)  Biopsies       02/20/2023 Dr. Berniece Salines MR Prostate without/with Contrast CLINICAL DATA:  Elevated PSA level of 7.99 on 12/03/2021. Remote history testicular cancer.  IMPRESSION: 1. No focal lesion of intermediate or higher suspicion for prostate cancer is identified by MRI. 2. Prostatomegaly and benign prostatic hypertrophy. 3. 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 4. Mild sigmoid colon diverticulosis.  Past/Anticipated interventions by urology, if any:     Past/Anticipated interventions by medical oncology, if any: NA  Weight changes, if any: No  IPSS:  19 SHIM:  24  Bowel/Bladder complaints, if any: Bladder urinary frequency up 2-3 times at night, weak urinary stream (moderate).  No bowel issues.  Nausea/Vomiting, if any: No  Pain issues, if any:  0/10  SAFETY ISSUES: Prior radiation? Yes, testicular cancer. Pacemaker/ICD? No Possible current pregnancy? Male Is the patient on methotrexate? No  Current Complaints / other details:

## 2023-04-28 ENCOUNTER — Ambulatory Visit
Admission: RE | Admit: 2023-04-28 | Discharge: 2023-04-28 | Disposition: A | Discharge status: Home / Self Care | Payer: BC Managed Care – PPO | Source: Ambulatory Visit | Attending: Radiation Oncology | Admitting: Radiation Oncology

## 2023-04-28 ENCOUNTER — Other Ambulatory Visit: Payer: Self-pay

## 2023-04-28 ENCOUNTER — Encounter: Payer: Self-pay | Admitting: Radiation Oncology

## 2023-04-28 VITALS — BP 141/84 | HR 71 | Temp 97.7°F | Resp 18 | Ht 71.0 in | Wt 191.6 lb

## 2023-04-28 DIAGNOSIS — Z803 Family history of malignant neoplasm of breast: Secondary | ICD-10-CM | POA: Insufficient documentation

## 2023-04-28 DIAGNOSIS — E785 Hyperlipidemia, unspecified: Secondary | ICD-10-CM | POA: Diagnosis not present

## 2023-04-28 DIAGNOSIS — F17210 Nicotine dependence, cigarettes, uncomplicated: Secondary | ICD-10-CM | POA: Insufficient documentation

## 2023-04-28 DIAGNOSIS — K760 Fatty (change of) liver, not elsewhere classified: Secondary | ICD-10-CM | POA: Insufficient documentation

## 2023-04-28 DIAGNOSIS — C61 Malignant neoplasm of prostate: Secondary | ICD-10-CM | POA: Diagnosis not present

## 2023-04-28 DIAGNOSIS — I10 Essential (primary) hypertension: Secondary | ICD-10-CM | POA: Insufficient documentation

## 2023-04-28 DIAGNOSIS — M8580 Other specified disorders of bone density and structure, unspecified site: Secondary | ICD-10-CM | POA: Insufficient documentation

## 2023-04-28 DIAGNOSIS — Z79899 Other long term (current) drug therapy: Secondary | ICD-10-CM | POA: Insufficient documentation

## 2023-04-28 DIAGNOSIS — E669 Obesity, unspecified: Secondary | ICD-10-CM | POA: Diagnosis not present

## 2023-04-28 DIAGNOSIS — Z85828 Personal history of other malignant neoplasm of skin: Secondary | ICD-10-CM | POA: Diagnosis not present

## 2023-04-28 DIAGNOSIS — Z7984 Long term (current) use of oral hypoglycemic drugs: Secondary | ICD-10-CM | POA: Diagnosis not present

## 2023-04-28 DIAGNOSIS — M109 Gout, unspecified: Secondary | ICD-10-CM | POA: Insufficient documentation

## 2023-04-28 DIAGNOSIS — Z923 Personal history of irradiation: Secondary | ICD-10-CM | POA: Insufficient documentation

## 2023-04-28 DIAGNOSIS — E119 Type 2 diabetes mellitus without complications: Secondary | ICD-10-CM | POA: Diagnosis not present

## 2023-04-28 DIAGNOSIS — Z85820 Personal history of malignant melanoma of skin: Secondary | ICD-10-CM | POA: Diagnosis not present

## 2023-04-28 DIAGNOSIS — Z191 Hormone sensitive malignancy status: Secondary | ICD-10-CM | POA: Diagnosis not present

## 2023-04-28 DIAGNOSIS — Z8547 Personal history of malignant neoplasm of testis: Secondary | ICD-10-CM | POA: Insufficient documentation

## 2023-04-28 DIAGNOSIS — Z8042 Family history of malignant neoplasm of prostate: Secondary | ICD-10-CM | POA: Diagnosis not present

## 2023-04-28 NOTE — Progress Notes (Signed)
Radiation Oncology         [phone] ________________________________  Initial Outpatient Consultation  Name: Steve Beasley MRN: [id number]  Date: 04/28/2023  DOB: January 09, 1966  [id number], Steve Lions, MD  Crist Fat, MD   REFERRING PHYSICIAN: Crist Fat, MD  DIAGNOSIS: 57 y.o. gentleman with Stage T1c adenocarcinoma of the prostate with Gleason score of 3+4, and PSA of 6.57.    ICD-10-CM   1. Malignant neoplasm of prostate (HCC)  C61       HISTORY OF PRESENT ILLNESS: Steve Beasley is a 57 y.o. male with a diagnosis of prostate cancer. He was originally diagnosed with prostate cancer in June of 2023. At the time of his diagnosis his PSA was 7.99 and his biopsy showed a Gleason score of 3+3. He had one positive core on his initial biopsy. He decided to proceed with active surveillance at that time.   Patient a MRI of the prostate on 02/14/22 that showed no evidence of high-grade disease. PSA in November of 2023 was 9.21. Repeat PSA was 6.57.   The patient proceeded to transrectal ultrasound with 14 biopsies of the prostate on 03/23/23.  The prostate volume measured 75 cc.  Out of 14 core biopsies, 2 were positive.  The maximum Gleason score was 3+4, and this was seen in the left mid. Perineural invasion was identified.   The patient reviewed the biopsy results with his urologist and he has kindly been referred today for discussion of potential radiation treatment options.   PREVIOUS RADIATION THERAPY: Yes   1999: Radiation to his testicles and some abdominal nodes for his testicular cancer. Treatment [facility] at [facility].   PAST MEDICAL HISTORY:  Past Medical History:  Diagnosis Date   Allergy-induced asthma    Anxiety state, unspecified 09/19/2013   Atypical chest pain 11/13/2014   Basal cell carcinoma of scalp 09/09/2016   and back   Diabetes mellitus without complication (HCC)    Fatty liver disease, nonalcoholic 09/19/2013   Gout 09/18/2014    Hypertension    Melanoma (HCC)    left upper arm   Nicotine use disorder 06/06/2013   smokeless   Obesity, unspecified 06/06/2013   Osteopenia    Other and unspecified hyperlipidemia 01/19/2014   Preventative health care 09/19/2013   Prostate cancer (HCC) 12/26/2020   Right heart enlargement 09/18/2014   Testicular cancer (HCC)    Tinnitus 03/24/2017   Valvular heart disease 09/18/2014      PAST SURGICAL HISTORY: Past Surgical History:  Procedure Laterality Date   BASAL CELL CARCINOMA EXCISION     Within 6 months   COLONOSCOPY     POLYPECTOMY     SKIN CANCER EXCISION     Within 6 months   testicular cancer surgery      FAMILY HISTORY:  Family History  Problem Relation Age of Onset   Diabetes Mother    Breast cancer Mother    Bone cancer Mother    Cancer Mother        breast with bone mets   Depression Mother    Hypertension Father    Prostate cancer Father    Hyperlipidemia Father    Cancer Father        prostate   Colon polyps Father    Stroke Paternal Grandfather    Prostate cancer Maternal Grandfather    Hypertension Brother    Gallstones Brother    Anxiety disorder Daughter    ADD / ADHD Daughter  Bulemia Daughter    Depression Son    Stroke Maternal Uncle 54   Anxiety disorder Maternal Aunt        social anxiety, anger   Heart attack Neg Hx    Sudden death Neg Hx    Colon cancer Neg Hx    Stomach cancer Neg Hx    Rectal cancer Neg Hx    Esophageal cancer Neg Hx     SOCIAL HISTORY:  Social History   Socioeconomic History   Marital status: Married    Spouse name: Not on file   Number of children: 2   Years of education: Not on file   Highest education level: Not on file  Occupational History   Occupation: finance  Tobacco Use   Smoking status: Former    Types: Cigarettes   Smokeless tobacco: Current    Types: Snuff, Chew    Last attempt to quit: 02/28/2014   Tobacco comments:    30 years ago socially/ 06/10/19 pt  uses oral nicotine chew  tobacco free   Vaping Use   Vaping status: Never Used  Substance and Sexual Activity   Alcohol use: Yes    Alcohol/week: 0.0 standard drinks of alcohol    Comment: 2 drinks per day varies    Drug use: No   Sexual activity: Yes    Comment: lives with wife, travels with work, no dietary restrictions , exercise regular  Other Topics Concern   Not on file  Social History Narrative   Not on file   Social Determinants of Health   Financial Resource Strain: Not on file  Food Insecurity: No Food Insecurity (04/28/2023)   Hunger Vital Sign    Worried About Running Out of Food in the Last Year: Never true    Ran Out of Food in the Last Year: Never true  Transportation Needs: No Transportation Needs (04/28/2023)   PRAPARE - Administrator, Civil Service (Medical): No    Lack of Transportation (Non-Medical): No  Physical Activity: Not on file  Stress: Not on file  Social Connections: Not on file  Intimate Partner Violence: Not At Risk (04/28/2023)   Humiliation, Afraid, Rape, and Kick questionnaire    Fear of Current or Ex-Partner: No    Emotionally Abused: No    Physically Abused: No    Sexually Abused: No    ALLERGIES: Januvia [sitagliptin]  MEDICATIONS:  Current Outpatient Medications  Medication Sig Dispense Refill   ciclopirox (PENLAC) 8 % solution Apply topically.     Fluorouracil 5 % SOLN Apply topically.     allopurinol (ZYLOPRIM) 100 MG tablet TAKE 1 TABLET(100 MG) BY MOUTH TWICE DAILY 180 tablet 1   amLODipine (NORVASC) 10 MG tablet Take 1 tablet (10 mg total) by mouth daily. 90 tablet 1   atorvastatin (LIPITOR) 20 MG tablet TAKE 1 TABLET(20 MG) BY MOUTH DAILY 90 tablet 1   glucose blood (ONETOUCH ULTRA) test strip USE TO CHECK BLOOD SUGAR DAILY AS DIRECTED. 100 strip 0   metFORMIN (GLUCOPHAGE) 1000 MG tablet TAKE 1 TABLET(1000 MG) BY MOUTH TWICE DAILY WITH A MEAL 180 tablet 1   tirzepatide (MOUNJARO) 5 MG/0.5ML Pen Inject 5 mg into the skin once a week. 6 mL 0    valsartan (DIOVAN) 320 MG tablet Take 1 tablet (320 mg total) by mouth daily. 90 tablet 0   VITAMIN D PO Take 3,000 Units by mouth daily.     No current facility-administered medications for this encounter.  REVIEW OF SYSTEMS:  On review of systems, the patient reports that he is doing well overall. He denies any chest pain, shortness of breath, cough, fevers, chills, night sweats, unintended weight changes. He denies any bowel disturbances, and denies abdominal pain, nausea or vomiting. He denies any new musculoskeletal or joint aches or pains. His IPSS was 19Total Score: 19, indicating moderate urinary symptoms.  His SHIM was 24SHIM: 24, indicating he does not have erectile dysfunction. A complete review of systems is obtained and is otherwise negative.   PHYSICAL EXAM:  Wt Readings from Last 3 Encounters:  04/28/23 191 lb 9.6 oz (86.9 kg)  02/05/23 189 lb (85.7 kg)  07/27/22 192 lb (87.1 kg)   Temp Readings from Last 3 Encounters:  04/28/23 97.7 F (36.5 C) (Oral)  02/05/23 98 F (36.7 C)  07/27/22 98.8 F (37.1 C) (Oral)   BP Readings from Last 3 Encounters:  04/28/23 (!) 141/84  02/05/23 (!) 142/64  07/27/22 117/77   Pulse Readings from Last 3 Encounters:  04/28/23 71  02/05/23 66  07/27/22 69   Pain Assessment Pain Score: 0-No pain/10  In general this is a well appearing gentleman in no acute distress. He's alert and oriented x4 and appropriate throughout the examination. Cardiopulmonary assessment is negative for acute distress, and he exhibits normal effort.    KPS = 100  100 - Normal; no complaints; no evidence of disease. 90   - Able to carry on normal activity; minor signs or symptoms of disease. 80   - Normal activity with effort; some signs or symptoms of disease. 6   - Cares for self; unable to carry on normal activity or to do active work. 60   - Requires occasional assistance, but is able to care for most of his personal needs. 50   - Requires  considerable assistance and frequent medical care. 40   - Disabled; requires special care and assistance. 30   - Severely disabled; hospital admission is indicated although death not imminent. 20   - Very sick; hospital admission necessary; active supportive treatment necessary. 10   - Moribund; fatal processes progressing rapidly. 0     - Dead  Karnofsky DA, Abelmann WH, Craver LS and Burchenal [facility] [phone]) The use of the nitrogen mustards in the palliative treatment of carcinoma: with particular reference to bronchogenic carcinoma Cancer 1 634-56  LABORATORY DATA:  Lab Results  Component Value Date   WBC 8.9 12/18/2020   HGB 14.1 12/18/2020   HCT 40.8 12/18/2020   MCV 87.1 12/18/2020   PLT 141.0 (L) 12/18/2020   Lab Results  Component Value Date   NA 136 02/05/2023   K 4.2 02/05/2023   CL 100 02/05/2023   CO2 28 02/05/2023   Lab Results  Component Value Date   ALT 33 02/05/2023   AST 22 02/05/2023   ALKPHOS 92 02/05/2023   BILITOT 1.4 (H) 02/07/2023     RADIOGRAPHY: No results found.    IMPRESSION/PLAN: 1. 57 y.o. gentleman with Stage T1c adenocarcinoma of the prostate with Gleason score of 3+4, and PSA of 6.57. We discussed the patient's workup and outlined the nature of prostate cancer in this setting. The patient's T stage, Gleason's score, and PSA put him into the favorable-intermediate risk group. Accordingly, he is eligible for a variety of potential treatment options including  brachytherapy, 5.5-8 weeks of external radiation,  or prostatectomy. We discussed the available radiation techniques, and focused on the details and logistics of delivery. We discussed  and outlined the risks, benefits, short and long-term effects associated with radiotherapy and compared and contrasted these with prostatectomy. We discussed the role of SpaceOAR gel in reducing the rectal toxicity associated with radiotherapy. He appears to have a good understanding of his disease and our treatment  recommendations which are of curative intent.  He was encouraged to ask questions that were answered to his stated satisfaction.  At the conclusion of our conversation, the patient would like to think about all of his treatment options. He understands that, due to his large prostate and high IPSS score, he is not an ideal candidate for brachytherapy but could still be considered for external beam radiation. He is scheduled to see Dr. Merilynn Finland to discuss HIFU therapy. We will make a referral to Dr. Laverle Patter for him to further discuss his surgical options. We will share our discussion with Dr. Marlou Porch, whom the patient will meet with later this fall to make his final decision.    We personally spent 60 minutes in this encounter including chart review, reviewing radiological studies, meeting face-to-face with the patient, entering orders and completing documentation.     Joyice Faster, PA-C    Margaretmary Dys, MD  [facility]  Radiation Oncology Direct Dial: [phone]  Fax: [phone] [email or web address]  Skype  LinkedIn

## 2023-04-28 NOTE — Progress Notes (Signed)
Introduced myself to the patient as the prostate nurse navigator.  No barriers to care identified at this time.  He is here to discuss his radiation treatment options.  Patient plans on having a few more consults prior to finalizing treatment decision.  He is scheduled to meet at [facility] to discuss HIFU on 9/5, and will then follow up with urology in October.  I gave him my business card and asked him to call me with questions, concerns, or treatment decision.  Verbalized understanding.

## 2023-04-30 ENCOUNTER — Encounter (INDEPENDENT_AMBULATORY_CARE_PROVIDER_SITE_OTHER): Payer: Self-pay

## 2023-05-05 DIAGNOSIS — L814 Other melanin hyperpigmentation: Secondary | ICD-10-CM | POA: Diagnosis not present

## 2023-05-05 DIAGNOSIS — D485 Neoplasm of uncertain behavior of skin: Secondary | ICD-10-CM | POA: Diagnosis not present

## 2023-05-05 DIAGNOSIS — L821 Other seborrheic keratosis: Secondary | ICD-10-CM | POA: Diagnosis not present

## 2023-05-05 DIAGNOSIS — L578 Other skin changes due to chronic exposure to nonionizing radiation: Secondary | ICD-10-CM | POA: Diagnosis not present

## 2023-05-05 DIAGNOSIS — D492 Neoplasm of unspecified behavior of bone, soft tissue, and skin: Secondary | ICD-10-CM | POA: Diagnosis not present

## 2023-05-05 DIAGNOSIS — D2272 Melanocytic nevi of left lower limb, including hip: Secondary | ICD-10-CM | POA: Diagnosis not present

## 2023-05-05 DIAGNOSIS — D1801 Hemangioma of skin and subcutaneous tissue: Secondary | ICD-10-CM | POA: Diagnosis not present

## 2023-05-15 ENCOUNTER — Encounter: Payer: BC Managed Care – PPO | Admitting: Family Medicine

## 2023-05-16 ENCOUNTER — Ambulatory Visit (AMBULATORY_SURGERY_CENTER): Payer: BC Managed Care – PPO

## 2023-05-16 VITALS — Ht 71.0 in | Wt 190.0 lb

## 2023-05-16 DIAGNOSIS — Z86010 Personal history of colonic polyps: Secondary | ICD-10-CM

## 2023-05-16 MED ORDER — NA SULFATE-K SULFATE-MG SULF 17.5-3.13-1.6 GM/177ML PO SOLN: 1.0000 | Freq: Once | ORAL | 0 refills | Status: AC

## 2023-05-16 NOTE — Progress Notes (Signed)
Pre visit completed via phone call; Patient verified name, DOB, and address;  No egg or soy allergy known to patient;  No issues known to pt with past sedation with any surgeries or procedures; Patient denies ever being told they had issues or difficulty with intubation;  No FH of Malignant Hyperthermia; Pt is not on diet pills; Pt is not on home 02;  Pt is not on blood thinners;  Pt denies issues with constipation  No A fib or A flutter;  Have any cardiac testing pending--NO Insurance verified during PV appt--- BCBS   Pt can ambulate without assistance;  Pt denies use of chewing tobacco Discussed diabetic/weight loss medication holds; Discussed NSAID holds; Checked BMI to be less than 50; Pt instructed to use Singlecare.com or GoodRx for a price reduction on prep;  Patient's chart reviewed by Cathlyn Parsons CNRA prior to previsit and patient appropriate for the [facility].  Pre visit completed and red dot placed by patient's name on their procedure day (on provider's schedule).    Instructions sent to patient via MyChart;

## 2023-05-16 NOTE — Progress Notes (Signed)
RN spoke with patient to confirm he would like to proceed with getting in to see Dr. Laverle Patter for surgical consult.  RN will reach out to [facility] to schedule and notify patient.    RN left voicemail with Dr. Vevelyn Royals nurse to review for scheduling surgical consult.  Will continue to follow to ensure he is scheduled.

## 2023-05-20 ENCOUNTER — Other Ambulatory Visit: Payer: Self-pay | Admitting: Family

## 2023-05-20 ENCOUNTER — Other Ambulatory Visit: Payer: Self-pay | Admitting: Family Medicine

## 2023-05-20 ENCOUNTER — Encounter: Payer: Self-pay | Admitting: Gastroenterology

## 2023-05-20 DIAGNOSIS — N4231 Prostatic intraepithelial neoplasia: Secondary | ICD-10-CM | POA: Diagnosis not present

## 2023-05-20 DIAGNOSIS — C61 Malignant neoplasm of prostate: Secondary | ICD-10-CM | POA: Diagnosis not present

## 2023-05-20 MED ORDER — TIRZEPATIDE 7.5 MG/0.5ML ~~LOC~~ SOAJ: 7.5000 mg | SUBCUTANEOUS | 1 refills | Status: DC

## 2023-05-26 ENCOUNTER — Encounter: Payer: Self-pay | Admitting: Family

## 2023-05-26 ENCOUNTER — Ambulatory Visit (INDEPENDENT_AMBULATORY_CARE_PROVIDER_SITE_OTHER): Payer: BC Managed Care – PPO | Admitting: Family

## 2023-05-26 VITALS — BP 137/69 | HR 62 | Temp 97.5°F | Resp 16 | Ht 71.0 in | Wt 195.0 lb

## 2023-05-26 DIAGNOSIS — E119 Type 2 diabetes mellitus without complications: Secondary | ICD-10-CM

## 2023-05-26 DIAGNOSIS — Z0000 Encounter for general adult medical examination without abnormal findings: Secondary | ICD-10-CM

## 2023-05-26 DIAGNOSIS — I10 Essential (primary) hypertension: Secondary | ICD-10-CM | POA: Diagnosis not present

## 2023-05-26 DIAGNOSIS — Z0001 Encounter for general adult medical examination with abnormal findings: Secondary | ICD-10-CM | POA: Diagnosis not present

## 2023-05-26 DIAGNOSIS — G43B0 Ophthalmoplegic migraine, not intractable: Secondary | ICD-10-CM | POA: Diagnosis not present

## 2023-05-26 DIAGNOSIS — Z23 Encounter for immunization: Secondary | ICD-10-CM | POA: Diagnosis not present

## 2023-05-26 DIAGNOSIS — F17200 Nicotine dependence, unspecified, uncomplicated: Secondary | ICD-10-CM

## 2023-05-26 DIAGNOSIS — M109 Gout, unspecified: Secondary | ICD-10-CM

## 2023-05-26 DIAGNOSIS — C4490 Unspecified malignant neoplasm of skin, unspecified: Secondary | ICD-10-CM

## 2023-05-26 DIAGNOSIS — C61 Malignant neoplasm of prostate: Secondary | ICD-10-CM

## 2023-05-26 DIAGNOSIS — E785 Hyperlipidemia, unspecified: Secondary | ICD-10-CM

## 2023-05-26 LAB — COMPREHENSIVE METABOLIC PANEL
ALT: 43 U/L (ref 0–53)
AST: 32 U/L (ref 0–37)
Albumin: 4.3 g/dL (ref 3.5–5.2)
Alkaline Phosphatase: 104 U/L (ref 39–117)
BUN: 14 mg/dL (ref 6–23)
CO2: 26 mEq/L (ref 19–32)
Calcium: 10.5 mg/dL (ref 8.4–10.5)
Chloride: 100 mEq/L (ref 96–112)
Creatinine, Ser: 1.27 mg/dL (ref 0.40–1.50)
GFR: 62.44 mL/min (ref >60.00)
Glucose, Bld: 132 mg/dL — ABNORMAL HIGH (ref 70–99)
Potassium: 4.1 mEq/L (ref 3.5–5.1)
Sodium: 135 mEq/L (ref 135–145)
Total Bilirubin: 1.8 mg/dL — ABNORMAL HIGH (ref 0.2–1.2)
Total Protein: 6.8 g/dL (ref 6.0–8.3)

## 2023-05-26 LAB — HEMOGLOBIN A1C: Hgb A1c MFr Bld: 5.9 % (ref 4.6–6.5)

## 2023-05-26 NOTE — Assessment & Plan Note (Signed)
BP Readings from Last 3 Encounters:  05/26/23 137/69  04/28/23 (!) 141/84  02/05/23 (!) 142/64   At goal on amlodipine and diovan.

## 2023-05-26 NOTE — Assessment & Plan Note (Signed)
Work up and treatment plans ongoing.  He is followed by Urology and has also seen radiation oncology.

## 2023-05-26 NOTE — Assessment & Plan Note (Signed)
Lab Results  Component Value Date   CHOL 80 02/05/2023   HDL 36.60 (L) 02/05/2023   LDLCALC 24 02/05/2023   LDLDIRECT 51.0 12/18/2020   TRIG 95.0 02/05/2023   CHOLHDL 2 02/05/2023   At goal on lipitor, cont same.

## 2023-05-26 NOTE — Patient Instructions (Signed)
VISIT SUMMARY:  During your recent visit, we discussed your ongoing health conditions including prostate cancer, hypertension, gout, and diabetes. We also discussed a recent episode of visual disturbance you experienced, which we believe was likely an optical migraine. Your diabetes is being managed effectively with Liraglutide and your gout with Allopurinol. We also discussed your nicotine dependence and your attempts to quit.  YOUR PLAN:  -PROSTATE CANCER: You are exploring multiple treatment options for your prostate cancer. Continue with your scheduled consultations and follow-up with Dr. Kathrynn Running on October 3rd to finalize your treatment plan.  -NICOTINE DEPENDENCE: You are currently using nicotine replacement therapy. Consider step-down therapy with a lower dose nicotine replacement when you feel ready.  -HYPERTENSION: Your blood pressure is slightly elevated despite being on maximum doses of Valsartan and Amlodipine. Continue with these medications and monitor your blood pressure. We may consider additional medication if your blood pressure remains elevated.  -DIABETES: Your diabetes is being managed effectively with Liraglutide. We will repeat your A1c test today to monitor your blood glucose levels.  -GOUT: You have a history of gout flares, which are currently being managed with Allopurinol. Continue with this medication and monitor for any gout flares.  -VISUAL DISTURBANCE: You recently experienced a visual disturbance followed by a headache, which we believe was likely an optical migraine. If these symptoms recur and do not resolve, you should go to the ER.  INSTRUCTIONS:  You have a colonoscopy scheduled for September 3rd and an eye exam also scheduled. You will receive a tetanus vaccine today and plan to receive a shingles vaccine at a later date. Please follow-up in three months.

## 2023-05-26 NOTE — Assessment & Plan Note (Signed)
Lab Results  Component Value Date   HGBA1C 6.1 02/05/2023   HGBA1C 6.6 (H) 07/18/2022   HGBA1C 8.8 (H) 03/20/2022   Lab Results  Component Value Date   MICROALBUR 4.3 (H) 02/05/2023   LDLCALC 24 02/05/2023   CREATININE 1.15 02/05/2023

## 2023-05-26 NOTE — Progress Notes (Signed)
Subjective:     Patient ID: Steve Beasley, male    DOB: 26-Sep-1966, 57 y.o.   MRN: [id number]  Chief Complaint  Patient presents with   Annual Exam    HPI  Discussed the use of AI scribe software for clinical note transcription with the patient, who gave verbal consent to proceed.  History of Present Illness   The patient, a middle-aged individual with a history of prostate cancer, testicular cancer, hypertension, gout, and diabetes type 2, presents for a routine follow-up and annual physical. The patient is currently exploring various treatment options for his prostate cancer, including surgical intervention, laser ablation, and high-frequency ultrasound. He is scheduled for consultations at multiple institutions and is awaiting further information before making a decision.  The patient also reports a recent visual disturbance, described as blurriness, waviness, and blind spots, which resolved after approximately 30-40 minutes, followed by a mild headache. This was a singular episode and the patient has not experienced any similar symptoms since.  The patient's diabetes is managed with [facility], which he reports has been effective in controlling his blood glucose levels. He has recently had his dosage increased to 7.5mg  and is hopeful this may result in further weight loss.  The patient also reports ongoing issues with gout, which is managed with Allopurinol. He has not had any recent flares. He also has a history of high blood pressure, managed with Valsartan and Amlodipine.  The patient admits to using a nicotine substitute product, but is attempting to reduce his usage. He also reports a mediocre diet and moderate exercise, with a recent return to running.  The patient has a history of basal cell carcinoma and melanoma, and is under regular dermatological surveillance. He also reports some bowel irregularity, alternating between constipation and diarrhea, which he attributes to his  medication.     Immunizations: Td due, shigrix due Diet: mediocre Exercise:  starting to get back into it.  Wt Readings from Last 3 Encounters:  05/26/23 195 lb (88.5 kg)  05/16/23 190 lb (86.2 kg)  04/28/23 191 lb 9.6 oz (86.9 kg)  Colonoscopy: scheduled on 9/3 PSA:  has known hx of prostate CA Vision: my eye doctor [facility]:  up to date    Health Maintenance Due  Topic Date Due   HIV Screening  Never done   Zoster Vaccines- Shingrix (1 of 2) Never done   OPHTHALMOLOGY EXAM  10/04/2016   COVID-19 Vaccine (4 - 2023-24 season) 05/31/2022   Colonoscopy  07/07/2022   INFLUENZA VACCINE  05/01/2023    Past Medical History:  Diagnosis Date   Allergy-induced asthma    Anxiety state, unspecified 09/19/2013   Atypical chest pain 11/13/2014   Basal cell carcinoma of scalp 09/09/2016   and back   Diabetes mellitus without complication (HCC)    on meds   Fatty liver disease, nonalcoholic 09/19/2013   Gout 09/18/2014   Hypertension    Melanoma (HCC)    left upper arm   Nicotine use disorder 06/06/2013   smokeless   Obesity, unspecified 06/06/2013   Osteopenia    Other and unspecified hyperlipidemia 01/19/2014   Preventative health care 09/19/2013   Prostate cancer (HCC) 12/26/2020   Right heart enlargement 09/18/2014   Testicular cancer (HCC)    Tinnitus 03/24/2017   Valvular heart disease 09/18/2014    Past Surgical History:  Procedure Laterality Date   BASAL CELL CARCINOMA EXCISION     Within 6 months   COLONOSCOPY  2020  GM-MAC-goly(good)-HPP/TA - 3 yr recall   POLYPECTOMY     SKIN CANCER EXCISION     Within 6 months   SURGERY SCROTAL / TESTICULAR  1999   cancer   WISDOM TOOTH EXTRACTION  1982    Family History  Problem Relation Age of Onset   Diabetes Mother    Breast cancer Mother    Bone cancer Mother    Cancer Mother        breast with bone mets   Depression Mother    Hypertension Father    Prostate cancer Father    Hyperlipidemia  Father    Cancer Father        prostate   Colon polyps Father 58   Hypertension Brother    Gallstones Brother    Anxiety disorder Maternal Aunt        social anxiety, anger   Stroke Maternal Uncle 77   Prostate cancer Maternal Grandfather    Stroke Paternal Grandfather    Anxiety disorder Daughter    ADD / ADHD Daughter    Bulemia Daughter    Depression Son    Heart attack Neg Hx    Sudden death Neg Hx    Colon cancer Neg Hx    Stomach cancer Neg Hx    Rectal cancer Neg Hx    Esophageal cancer Neg Hx     Social History   Socioeconomic History   Marital status: Married    Spouse name: Not on file   Number of children: 2   Years of education: Not on file   Highest education level: Not on file  Occupational History   Occupation: finance  Tobacco Use   Smoking status: Former    Types: Cigarettes   Smokeless tobacco: Current    Types: Snuff, Chew    Last attempt to quit: 02/28/2014   Tobacco comments:    30 years ago socially/ 06/10/19 pt  uses oral nicotine chew tobacco free   Vaping Use   Vaping status: Never Used  Substance and Sexual Activity   Alcohol use: Yes    Alcohol/week: 14.0 standard drinks of alcohol    Types: 14 Standard drinks or equivalent per week    Comment: 2 drinks per day varies    Drug use: No   Sexual activity: Yes    Comment: lives with wife, travels with work, no dietary restrictions , exercise regular  Other Topics Concern   Not on file  Social History Narrative   Works at [employer] Strain: Not on file  Food Insecurity: No Food Insecurity (04/28/2023)   Hunger Vital Sign    Worried About Running Out of Food in the Last Year: Never true    Ran Out of Food in the Last Year: Never true  Transportation Needs: No Transportation Needs (04/28/2023)   PRAPARE - Administrator, Civil Service (Medical): No    Lack of Transportation (Non-Medical): No  Physical Activity: Not on file   Stress: Not on file  Social Connections: Not on file  Intimate Partner Violence: Not At Risk (04/28/2023)   Humiliation, Afraid, Rape, and Kick questionnaire    Fear of Current or Ex-Partner: No    Emotionally Abused: No    Physically Abused: No    Sexually Abused: No    Outpatient Medications Prior to Visit  Medication Sig Dispense Refill   allopurinol (ZYLOPRIM) 100 MG tablet TAKE 1 TABLET(100 MG) BY MOUTH  TWICE DAILY (Patient taking differently: 100 mg daily. TAKE 1 TABLET(100 MG) BY MOUTH TWICE DAILY) 180 tablet 1   amLODipine (NORVASC) 10 MG tablet Take 1 tablet (10 mg total) by mouth daily. 90 tablet 1   atorvastatin (LIPITOR) 20 MG tablet TAKE 1 TABLET(20 MG) BY MOUTH DAILY 90 tablet 1   Fluorouracil 5 % SOLN Apply topically.     glucose blood (ONETOUCH ULTRA) test strip USE TO CHECK BLOOD SUGAR DAILY AS DIRECTED. 100 strip 0   metFORMIN (GLUCOPHAGE) 1000 MG tablet TAKE 1 TABLET(1000 MG) BY MOUTH TWICE DAILY WITH A MEAL 180 tablet 1   tirzepatide (MOUNJARO) 7.5 MG/0.5ML Pen Inject 7.5 mg into the skin once a week. 6 mL 1   valsartan (DIOVAN) 320 MG tablet Take 1 tablet (320 mg total) by mouth daily. 90 tablet 0   VITAMIN D PO Take 3,000 Units by mouth daily.     No facility-administered medications prior to visit.    Allergies  Allergen Reactions   Januvia [Sitagliptin] Rash    Review of Systems  HENT:  Positive for hearing loss (wearing hearing aids). Negative for congestion.   Eyes:  Negative for blurred vision.  Respiratory:  Negative for cough.   Cardiovascular:  Negative for leg swelling.  Gastrointestinal:  Positive for constipation and diarrhea.  Genitourinary:  Positive for frequency (nocturia). Negative for dysuria.  Musculoskeletal:  Negative for myalgias.  Skin:  Negative for rash.       He is following with dermatology  Neurological:  Positive for headaches.  Psychiatric/Behavioral:         Denies depression/anxiety       Objective:    Physical  Exam   BP 137/69 (BP Location: Right Arm, Patient Position: Sitting, Cuff Size: Small)   Pulse 62   Temp (!) 97.5 F (36.4 C) (Oral)   Resp 16   Ht 5\' 11"  (1.803 m)   Wt 195 lb (88.5 kg)   SpO2 100%   BMI 27.20 kg/m  Wt Readings from Last 3 Encounters:  05/26/23 195 lb (88.5 kg)  05/16/23 190 lb (86.2 kg)  04/28/23 191 lb 9.6 oz (86.9 kg)  Physical Exam  Constitutional: He is oriented to person, place, and time. He appears well-developed and well-nourished. No distress.  HENT:  Head: Normocephalic and atraumatic.  Right Ear: not examined- hearing aid in place Left Ear: not examined- hearing aid in place  Mouth/Throat: Oropharynx is clear and moist.  Eyes: Pupils are equal, round, and reactive to light. No scleral icterus.  Neck: Normal range of motion. No thyromegaly present.  Cardiovascular: Normal rate and regular rhythm.   No murmur heard. Pulmonary/Chest: Effort normal and breath sounds normal. No respiratory distress. He has no wheezes. He has no rales. He exhibits no tenderness.  Abdominal: Soft. Bowel sounds are normal. He exhibits no distension and no mass. There is no tenderness. There is no rebound and no guarding.  Musculoskeletal: He exhibits no edema.  Lymphadenopathy:    He has no cervical adenopathy.  Neurological: He is alert and oriented to person, place, and time. He has normal patellar reflexes. He exhibits normal muscle tone. Coordination normal.  Skin: Skin is warm and dry.  Psychiatric: He has a normal mood and affect. His behavior is normal. Judgment and thought content normal.  Diabetic Foot Exam - Simple   Simple Foot Form Diabetic Foot exam was performed with the following findings: Yes 05/26/2023  7:38 AM  Visual Inspection No deformities, no ulcerations, no  other skin breakdown bilaterally: Yes Sensation Testing Intact to touch and monofilament testing bilaterally: Yes Pulse Check Posterior Tibialis and Dorsalis pulse intact bilaterally:  Yes Comments           Assessment & Plan:        Assessment & Plan:   Problem List Items Addressed This Visit       Unprioritized   Skin cancer    Continue q6 month skin checks with dermatology.       Prostate cancer [facility])    Work up and treatment plans ongoing.  He is followed by Urology and has also seen radiation oncology.       Primary hypertension    Bp at goal. Continue amlodipine and valsartan.       Preventative health care - Primary    Continue healthy diet, exercise and weight loss efforts.  Colo and vision exams both scheduled. Tdap today. He will return for a nurse visit on a Friday for Shingrix #1.       Ophthalmoplegic migraine, not intractable    Had one episode of visual disturbance followed by [facility]. Most consistent with optical migraine. Pt is advised to go to the ER if recurrent symptoms that do not improve.       Nicotine use disorder    Patient counseled on cessation.       Hyperlipidemia    Lab Results  Component Value Date   CHOL 80 02/05/2023   HDL 36.60 (L) 02/05/2023   LDLCALC 24 02/05/2023   LDLDIRECT 51.0 12/18/2020   TRIG 95.0 02/05/2023   CHOLHDL 2 02/05/2023   At goal on lipitor, cont same.      Gout    Stable on allopurinol once daily. Continue same.       DM (diabetes mellitus), type 2 (HCC)    Lab Results  Component Value Date   HGBA1C 6.1 02/05/2023   HGBA1C 6.6 (H) 07/18/2022   HGBA1C 8.8 (H) 03/20/2022   Lab Results  Component Value Date   MICROALBUR 4.3 (H) 02/05/2023   LDLCALC 24 02/05/2023   CREATININE 1.15 02/05/2023         Relevant Orders   HgB A1c   Comp Met (CMET)    I am having Peter Garter. Carollee Herter maintain his VITAMIN D PO, OneTouch Ultra, valsartan, atorvastatin, allopurinol, metFORMIN, amLODipine, Fluorouracil, and tirzepatide.  No orders of the defined types were placed in this encounter.

## 2023-05-26 NOTE — Assessment & Plan Note (Addendum)
>>  ASSESSMENT AND PLAN FOR PRIMARY HYPERTENSION WRITTEN ON 05/26/2023  7:55 AM BY O'SULLIVAN, Earl Zellmer, NP  Bp at goal. Continue amlodipine and valsartan.    >>ASSESSMENT AND PLAN FOR HTN (HYPERTENSION) WRITTEN ON 05/26/2023  7:24 AM BY O'SULLIVAN, Katielynn Horan, NP  BP Readings from Last 3 Encounters:  05/26/23 137/69  04/28/23 (!) 141/84  02/05/23 (!) 142/64   At goal on amlodipine and diovan.

## 2023-05-26 NOTE — Assessment & Plan Note (Signed)
Stable on allopurinol once daily. Continue same.

## 2023-05-26 NOTE — Assessment & Plan Note (Addendum)
Continue healthy diet, exercise and weight loss efforts.  Colo and vision exams both scheduled. Tdap today. He will return for a nurse visit on a Friday for Shingrix #1.

## 2023-05-26 NOTE — Assessment & Plan Note (Signed)
Patient counseled on cessation.

## 2023-05-26 NOTE — Assessment & Plan Note (Signed)
Had one episode of visual disturbance followed by [facility]. Most consistent with optical migraine. Pt is advised to go to the ER if recurrent symptoms that do not improve.

## 2023-05-26 NOTE — Addendum Note (Signed)
Addended by: Wilford Corner on: 05/26/2023 10:15 AM   Modules accepted: Orders

## 2023-05-26 NOTE — Assessment & Plan Note (Signed)
Continue q6 month skin checks with dermatology.

## 2023-05-28 NOTE — Progress Notes (Signed)
Patient is scheduled for surgical consult with Dr. Laverle Patter on 06/27/23.  RN will follow up after consult.

## 2023-05-30 ENCOUNTER — Other Ambulatory Visit: Payer: Self-pay | Admitting: Family

## 2023-06-03 ENCOUNTER — Ambulatory Visit (AMBULATORY_SURGERY_CENTER): Payer: BC Managed Care – PPO | Admitting: Gastroenterology

## 2023-06-03 ENCOUNTER — Encounter: Payer: Self-pay | Admitting: Gastroenterology

## 2023-06-03 VITALS — BP 104/46 | HR 65 | Temp 97.1°F | Resp 17 | Ht 71.0 in | Wt 190.0 lb

## 2023-06-03 DIAGNOSIS — Z86010 Personal history of colonic polyps: Secondary | ICD-10-CM

## 2023-06-03 DIAGNOSIS — Z09 Encounter for follow-up examination after completed treatment for conditions other than malignant neoplasm: Secondary | ICD-10-CM

## 2023-06-03 DIAGNOSIS — Z1211 Encounter for screening for malignant neoplasm of colon: Secondary | ICD-10-CM | POA: Diagnosis not present

## 2023-06-03 DIAGNOSIS — D128 Benign neoplasm of rectum: Secondary | ICD-10-CM

## 2023-06-03 DIAGNOSIS — D125 Benign neoplasm of sigmoid colon: Secondary | ICD-10-CM

## 2023-06-03 DIAGNOSIS — D127 Benign neoplasm of rectosigmoid junction: Secondary | ICD-10-CM

## 2023-06-03 DIAGNOSIS — D123 Benign neoplasm of transverse colon: Secondary | ICD-10-CM | POA: Diagnosis not present

## 2023-06-03 MED ORDER — VALSARTAN 320 MG PO TABS: 320.0000 mg | ORAL_TABLET | Freq: Every day | ORAL | 0 refills | Status: DC

## 2023-06-03 MED ORDER — SODIUM CHLORIDE 0.9 % IV SOLN: 500.0000 mL | INTRAVENOUS | Status: DC

## 2023-06-03 NOTE — Op Note (Addendum)
[facility] Patient Name: Steve Beasley Procedure Date: 06/03/2023 8:32 AM MRN: [id number] Endoscopist: Corliss Parish , MD, [id number] Age: 57 Referring MD:  Date of Birth: 03-05-1966 Gender: Male Account #: [id number] Procedure:                Colonoscopy Indications:              Surveillance: Personal history of adenomatous                            polyps on last colonoscopy > 3 years ago Medicines:                Monitored Anesthesia Care Procedure:                Pre-Anesthesia Assessment:                           - Prior to the procedure, a History and Physical                            was performed, and patient medications and                            allergies were reviewed. The patient's tolerance of                            previous anesthesia was also reviewed. The risks                            and benefits of the procedure and the sedation                            options and risks were discussed with the patient.                            All questions were answered, and informed consent                            was obtained. Prior Anticoagulants: The patient has                            taken no anticoagulant or antiplatelet agents. ASA                            Grade Assessment: III - A patient with severe                            systemic disease. After reviewing the risks and                            benefits, the patient was deemed in satisfactory                            condition to undergo the procedure.  After obtaining informed consent, the colonoscope                            was passed under direct vision. Throughout the                            procedure, the patient's blood pressure, pulse, and                            oxygen saturations were monitored continuously. The                            CF [id number] #[id number] was introduced through the anus                            and advanced  to the the cecum, identified by                            appendiceal orifice and ileocecal valve. The                            colonoscopy was performed without difficulty. The                            patient tolerated the procedure. The quality of the                            bowel preparation was good. The ileocecal valve,                            appendiceal orifice, and rectum were photographed. Scope In: 8:40:01 AM Scope Out: 8:54:48 AM Scope Withdrawal Time: 0 hours 11 minutes 32 seconds  Total Procedure Duration: 0 hours 14 minutes 47 seconds  Findings:                 The digital rectal exam findings include                            hemorrhoids. Pertinent negatives include no                            palpable rectal lesions.                           Four sessile polyps were found in the rectum,                            recto-sigmoid colon, sigmoid colon and transverse                            colon. The polyps were 2 to 4 mm in size. These                            polyps were removed with a cold snare. Resection  and retrieval were complete.                           Normal mucosa was found in the entire colon                            otherwise.                           Non-bleeding non-thrombosed external and internal                            hemorrhoids were found during retroflexion, during                            perianal exam and during digital exam. The                            hemorrhoids were Grade II (internal hemorrhoids                            that prolapse but reduce spontaneously). Complications:            No immediate complications. Estimated Blood Loss:     Estimated blood loss was minimal. Impression:               - Hemorrhoids found on digital rectal exam.                           - Four 2 to 4 mm polyps in the rectum, at the                            recto-sigmoid colon, in the sigmoid colon and in                             the transverse colon, removed with a cold snare.                            Resected and retrieved.                           - Normal mucosa in the entire examined colon                            otherwise.                           - Non-bleeding non-thrombosed external and internal                            hemorrhoids. Recommendation:           - The patient will be observed post-procedure,                            until all discharge criteria are met.                           -  Discharge patient to home.                           - Patient has a contact number available for                            emergencies. The signs and symptoms of potential                            delayed complications were discussed with the                            patient. Return to normal activities tomorrow.                            Written discharge instructions were provided to the                            patient.                           - High fiber diet.                           - Use FiberCon 1-2 tablets PO daily.                           - Continue present medications.                           - Await pathology results.                           - Repeat colonoscopy in 3/5/7 years for                            surveillance based on pathology results and                            previous history of adenomatous colon polyps.                           - The findings and recommendations were discussed                            with the patient.                           - The findings and recommendations were discussed                            with the patient's family. Corliss Parish, MD 06/03/2023 8:59:25 AM

## 2023-06-03 NOTE — Progress Notes (Signed)
Uneventful anesthetic. Report to pacu rn. Vss. Care resumed by rn. 

## 2023-06-03 NOTE — Patient Instructions (Signed)

## 2023-06-03 NOTE — Progress Notes (Signed)
Patient states there have been no changes to medical or surgical history since time of pre-visit. 

## 2023-06-03 NOTE — Progress Notes (Signed)
GASTROENTEROLOGY PROCEDURE H&P NOTE   Primary Care Physician: Bradd Canary, MD  HPI: Steve Beasley is a 57 y.o. male who presents for Colonoscopy for surveillance of previous adenomas.  Past Medical History:  Diagnosis Date   Allergy-induced asthma    Anxiety state, unspecified 09/19/2013   Atypical chest pain 11/13/2014   Basal cell carcinoma of scalp 09/09/2016   and back   Diabetes mellitus without complication (HCC)    on meds   Fatty liver disease, nonalcoholic 09/19/2013   Gout 09/18/2014   Hypertension    Melanoma (HCC)    left upper arm   Nicotine use disorder 06/06/2013   smokeless   Obesity, unspecified 06/06/2013   Osteopenia    Other and unspecified hyperlipidemia 01/19/2014   Preventative health care 09/19/2013   Prostate cancer (HCC) 12/26/2020   Right heart enlargement 09/18/2014   Testicular cancer (HCC)    Tinnitus 03/24/2017   Valvular heart disease 09/18/2014   Past Surgical History:  Procedure Laterality Date   BASAL CELL CARCINOMA EXCISION     Within 6 months   COLONOSCOPY  2020   GM-MAC-goly(good)-HPP/TA - 3 yr recall   POLYPECTOMY     SKIN CANCER EXCISION     Within 6 months   SURGERY SCROTAL / TESTICULAR  1999   cancer   WISDOM TOOTH EXTRACTION  1982   Current Outpatient Medications  Medication Sig Dispense Refill   allopurinol (ZYLOPRIM) 100 MG tablet TAKE 1 TABLET(100 MG) BY MOUTH TWICE DAILY (Patient taking differently: 100 mg daily. TAKE 1 TABLET(100 MG) BY MOUTH TWICE DAILY) 180 tablet 1   amLODipine (NORVASC) 10 MG tablet Take 1 tablet (10 mg total) by mouth daily. 90 tablet 1   atorvastatin (LIPITOR) 20 MG tablet TAKE 1 TABLET(20 MG) BY MOUTH DAILY 90 tablet 1   Fluorouracil 5 % SOLN Apply topically.     glucose blood (ONETOUCH ULTRA) test strip USE TO CHECK BLOOD SUGAR DAILY AS DIRECTED. 100 strip 0   metFORMIN (GLUCOPHAGE) 1000 MG tablet TAKE 1 TABLET(1000 MG) BY MOUTH TWICE DAILY WITH A MEAL 180 tablet 1   tirzepatide  (MOUNJARO) 7.5 MG/0.5ML Pen Inject 7.5 mg into the skin once a week. 6 mL 1   valsartan (DIOVAN) 320 MG tablet Take 1 tablet (320 mg total) by mouth daily. 90 tablet 0   VITAMIN D PO Take 3,000 Units by mouth daily.     Current Facility-Administered Medications  Medication Dose Route Frequency Provider Last Rate Last Admin   0.9 %  sodium chloride infusion  500 mL Intravenous Continuous Mansouraty, Netty Starring., MD        Current Outpatient Medications:    allopurinol (ZYLOPRIM) 100 MG tablet, TAKE 1 TABLET(100 MG) BY MOUTH TWICE DAILY (Patient taking differently: 100 mg daily. TAKE 1 TABLET(100 MG) BY MOUTH TWICE DAILY), Disp: 180 tablet, Rfl: 1   amLODipine (NORVASC) 10 MG tablet, Take 1 tablet (10 mg total) by mouth daily., Disp: 90 tablet, Rfl: 1   atorvastatin (LIPITOR) 20 MG tablet, TAKE 1 TABLET(20 MG) BY MOUTH DAILY, Disp: 90 tablet, Rfl: 1   Fluorouracil 5 % SOLN, Apply topically., Disp: , Rfl:    glucose blood (ONETOUCH ULTRA) test strip, USE TO CHECK BLOOD SUGAR DAILY AS DIRECTED., Disp: 100 strip, Rfl: 0   metFORMIN (GLUCOPHAGE) 1000 MG tablet, TAKE 1 TABLET(1000 MG) BY MOUTH TWICE DAILY WITH A MEAL, Disp: 180 tablet, Rfl: 1   tirzepatide (MOUNJARO) 7.5 MG/0.5ML Pen, Inject 7.5 mg into the skin  once a week., Disp: 6 mL, Rfl: 1   valsartan (DIOVAN) 320 MG tablet, Take 1 tablet (320 mg total) by mouth daily., Disp: 90 tablet, Rfl: 0   VITAMIN D PO, Take 3,000 Units by mouth daily., Disp: , Rfl:   Current Facility-Administered Medications:    0.9 %  sodium chloride infusion, 500 mL, Intravenous, Continuous, Mansouraty, Netty Starring., MD Allergies  Allergen Reactions   Januvia [Sitagliptin] Rash   Family History  Problem Relation Age of Onset   Diabetes Mother    Breast cancer Mother    Bone cancer Mother    Cancer Mother        breast with bone mets   Depression Mother    Hypertension Father    Prostate cancer Father    Hyperlipidemia Father    Cancer Father         prostate   Colon polyps Father 26   Hypertension Brother    Gallstones Brother    Anxiety disorder Maternal Aunt        social anxiety, anger   Stroke Maternal Uncle 53   Prostate cancer Maternal Grandfather    Stroke Paternal Grandfather    Anxiety disorder Daughter    ADD / ADHD Daughter    Bulemia Daughter    Depression Son    Heart attack Neg Hx    Sudden death Neg Hx    Colon cancer Neg Hx    Stomach cancer Neg Hx    Rectal cancer Neg Hx    Esophageal cancer Neg Hx    Social History   Socioeconomic History   Marital status: Married    Spouse name: Not on file   Number of children: 2   Years of education: Not on file   Highest education level: Not on file  Occupational History   Occupation: finance  Tobacco Use   Smoking status: Former    Types: Cigarettes   Smokeless tobacco: Current    Types: Snuff, Chew    Last attempt to quit: 02/28/2014   Tobacco comments:    30 years ago socially/ 06/10/19 pt  uses oral nicotine chew tobacco free   Vaping Use   Vaping status: Never Used  Substance and Sexual Activity   Alcohol use: Yes    Alcohol/week: 14.0 standard drinks of alcohol    Types: 14 Standard drinks or equivalent per week    Comment: 2 drinks per day varies    Drug use: No   Sexual activity: Yes    Comment: lives with wife, travels with work, no dietary restrictions , exercise regular  Other Topics Concern   Not on file  Social History Narrative   Works at [employer] Strain: Not on file  Food Insecurity: No Food Insecurity (04/28/2023)   Hunger Vital Sign    Worried About Running Out of Food in the Last Year: Never true    Ran Out of Food in the Last Year: Never true  Transportation Needs: No Transportation Needs (04/28/2023)   PRAPARE - Administrator, Civil Service (Medical): No    Lack of Transportation (Non-Medical): No  Physical Activity: Not on file  Stress: Not on file  Social  Connections: Not on file  Intimate Partner Violence: Not At Risk (04/28/2023)   Humiliation, Afraid, Rape, and Kick questionnaire    Fear of Current or Ex-Partner: No    Emotionally Abused: No    Physically Abused: No  Sexually Abused: No    Physical Exam: Today's Vitals   06/03/23 0748  BP: 131/62  Pulse: 60  Temp: (!) 97.1 F (36.2 C)  TempSrc: Skin  SpO2: 100%  Weight: 190 lb (86.2 kg)  Height: 5\' 11"  (1.803 m)   Body mass index is 26.5 kg/m. GEN: NAD EYE: Sclerae anicteric ENT: MMM CV: Non-tachycardic GI: Soft, NT/ND NEURO:  Alert & Oriented x 3  Lab Results: No results for input(s): "WBC", "HGB", "HCT", "PLT" in the last 72 hours. BMET No results for input(s): "NA", "K", "CL", "CO2", "GLUCOSE", "BUN", "CREATININE", "CALCIUM" in the last 72 hours. LFT No results for input(s): "PROT", "ALBUMIN", "AST", "ALT", "ALKPHOS", "BILITOT", "BILIDIR", "IBILI" in the last 72 hours. PT/INR No results for input(s): "LABPROT", "INR" in the last 72 hours.   Impression / Plan: This is a 57 y.o.male who presents for Colonoscopy for surveillance of previous adenomas.  The risks and benefits of endoscopic evaluation/treatment were discussed with the patient and/or family; these include but are not limited to the risk of perforation, infection, bleeding, missed lesions, lack of diagnosis, severe illness requiring hospitalization, as well as anesthesia and sedation related illnesses.  The patient's history has been reviewed, patient examined, no change in status, and deemed stable for procedure.  The patient and/or family is agreeable to proceed.    Corliss Parish, MD [facility]oscopy Office # [phone]

## 2023-06-03 NOTE — Progress Notes (Signed)
Called to room to assist during endoscopic procedure.  Patient ID and intended procedure confirmed with present staff. Received instructions for my participation in the procedure from the performing physician.  

## 2023-06-04 ENCOUNTER — Telehealth: Payer: Self-pay

## 2023-06-04 NOTE — Telephone Encounter (Signed)
  Follow up Call-     06/03/2023    7:45 AM  Call back number  Post procedure Call Back phone  # [phone]  Permission to leave phone message Yes     Patient questions:  Do you have a fever, pain , or abdominal swelling? No. Pain Score  0 *  Have you tolerated food without any problems? Yes.    Have you been able to return to your normal activities? Yes.    Do you have any questions about your discharge instructions: Diet   No. Medications  No. Follow up visit  No.  Do you have questions or concerns about your Care? No.  Actions: * If pain score is 4 or above: No action needed, pain <4.

## 2023-06-07 ENCOUNTER — Encounter: Payer: Self-pay | Admitting: Gastroenterology

## 2023-06-09 ENCOUNTER — Encounter: Payer: Self-pay | Admitting: Gastroenterology

## 2023-06-11 LAB — HM DIABETES EYE EXAM

## 2023-06-23 DIAGNOSIS — C61 Malignant neoplasm of prostate: Secondary | ICD-10-CM | POA: Diagnosis not present

## 2023-06-27 DIAGNOSIS — C61 Malignant neoplasm of prostate: Secondary | ICD-10-CM | POA: Diagnosis not present

## 2023-06-30 NOTE — Progress Notes (Signed)
Patient will be pursing treatment with [facility].  RN left message to confirm decision.

## 2023-07-10 ENCOUNTER — Encounter: Payer: Self-pay | Admitting: Family

## 2023-07-10 DIAGNOSIS — R972 Elevated prostate specific antigen [PSA]: Secondary | ICD-10-CM

## 2023-07-21 ENCOUNTER — Other Ambulatory Visit: Payer: BC Managed Care – PPO

## 2023-07-21 DIAGNOSIS — R972 Elevated prostate specific antigen [PSA]: Secondary | ICD-10-CM | POA: Diagnosis not present

## 2023-07-21 NOTE — Progress Notes (Signed)
Specimen provided

## 2023-07-22 ENCOUNTER — Other Ambulatory Visit: Payer: BC Managed Care – PPO

## 2023-07-22 LAB — URINALYSIS, ROUTINE W REFLEX MICROSCOPIC
Bacteria, UA: NONE SEEN /[HPF]
Bilirubin Urine: NEGATIVE
Glucose, UA: NEGATIVE
Hgb urine dipstick: NEGATIVE
Hyaline Cast: NONE SEEN /[LPF]
Ketones, ur: NEGATIVE
Leukocytes,Ua: NEGATIVE
Nitrite: NEGATIVE
RBC / HPF: NONE SEEN /[HPF] (ref 0–2)
Specific Gravity, Urine: 1.022 (ref 1.001–1.035)
Squamous Epithelial / HPF: NONE SEEN /[HPF] (ref <=5)
WBC, UA: NONE SEEN /[HPF] (ref 0–5)
pH: 5.5 (ref 5.0–8.0)

## 2023-07-22 LAB — MICROSCOPIC MESSAGE

## 2023-07-22 LAB — URINE CULTURE
MICRO NUMBER:: 15620256
Result:: NO GROWTH
SPECIMEN QUALITY:: ADEQUATE

## 2023-08-11 ENCOUNTER — Other Ambulatory Visit: Payer: Self-pay | Admitting: Family Medicine

## 2023-08-11 ENCOUNTER — Encounter: Payer: Self-pay | Admitting: *Deleted

## 2023-08-19 ENCOUNTER — Other Ambulatory Visit: Payer: Self-pay | Admitting: Family Medicine

## 2023-09-04 ENCOUNTER — Other Ambulatory Visit (HOSPITAL_COMMUNITY): Payer: Self-pay

## 2023-09-06 ENCOUNTER — Encounter: Payer: Self-pay | Admitting: Family

## 2023-09-06 DIAGNOSIS — E119 Type 2 diabetes mellitus without complications: Secondary | ICD-10-CM

## 2023-09-06 DIAGNOSIS — C61 Malignant neoplasm of prostate: Secondary | ICD-10-CM

## 2023-09-06 DIAGNOSIS — E785 Hyperlipidemia, unspecified: Secondary | ICD-10-CM

## 2023-09-08 MED ORDER — TIRZEPATIDE 7.5 MG/0.5ML ~~LOC~~ SOAJ: 7.5000 mg | SUBCUTANEOUS | 1 refills | Status: DC

## 2023-09-08 NOTE — Telephone Encounter (Signed)
Pt called and scheduled a follow-up appt with Melissa on 11/28/23. He wanted to know if Steve Beasley wants him to complete blood work a week before.   He also asked If he could have his PSA done at the same time? Please call and advise with Patient.

## 2023-09-09 ENCOUNTER — Telehealth: Payer: Self-pay | Admitting: Family Medicine

## 2023-09-09 NOTE — Telephone Encounter (Signed)
Pt called stating that his tirzepatide [facility]) 7.5 MG/0.5ML Pen requires a PA and needed to be initiated per his pharmacy.

## 2023-09-10 NOTE — Addendum Note (Signed)
Addended by: Sandford Craze on: 09/10/2023 08:16 AM   Modules accepted: Orders

## 2023-09-11 ENCOUNTER — Telehealth: Payer: Self-pay

## 2023-09-11 ENCOUNTER — Other Ambulatory Visit (HOSPITAL_COMMUNITY): Payer: Self-pay

## 2023-09-11 DIAGNOSIS — D485 Neoplasm of uncertain behavior of skin: Secondary | ICD-10-CM | POA: Diagnosis not present

## 2023-09-11 NOTE — Telephone Encounter (Signed)
PA request has been Approved. New Encounter created for follow up. For additional info see Pharmacy Prior Auth telephone encounter from 09/11/23.

## 2023-09-11 NOTE — Telephone Encounter (Signed)
Pharmacy Patient Advocate Encounter   Received notification from Pt Calls Messages that prior authorization for Mounjaro 7.5MG /0.5ML auto-injectors is required/requested.   Insurance verification completed.   The patient is insured through [employer] .   Per test claim: PA required and submitted KEY/EOC/Request #: [id number] APPROVED from 08/12/23 to 09/10/24. Ran test claim, Copay is $30. This test claim was processed through [facility]- copay amounts may vary at other pharmacies due to pharmacy/plan contracts, or as the patient moves through the different stages of their insurance plan.   PA Case ID #: [id number]

## 2023-10-27 ENCOUNTER — Ambulatory Visit: Payer: Self-pay | Admitting: Family Medicine

## 2023-10-27 ENCOUNTER — Ambulatory Visit: Payer: BC Managed Care – PPO | Admitting: Physician Assistant

## 2023-10-27 ENCOUNTER — Encounter: Payer: Self-pay | Admitting: Physician Assistant

## 2023-10-27 VITALS — BP 148/82 | HR 67 | Temp 98.0°F | Ht 71.0 in | Wt 193.2 lb

## 2023-10-27 DIAGNOSIS — H66002 Acute suppurative otitis media without spontaneous rupture of ear drum, left ear: Secondary | ICD-10-CM | POA: Diagnosis not present

## 2023-10-27 MED ORDER — AMOXICILLIN-POT CLAVULANATE 875-125 MG PO TABS: 1.0000 | ORAL_TABLET | Freq: Two times a day (BID) | ORAL | 0 refills | Status: AC

## 2023-10-27 NOTE — Progress Notes (Signed)
Established patient visit   Patient: Steve Beasley   DOB: Jun 03, 1966   58 y.o. Male  MRN: [id number] Visit Date: 10/27/2023  Today's healthcare provider: Alfredia Ferguson, PA-C   Chief Complaint  Patient presents with   URI    Just left ear- clogged feeling. Both eyes are stuck together in morning, green crusty. Cough- mucus and congested. OTC- sudafed and mucinex- not helping   Subjective    Pt reports 10 days of cough, L ear pain, L ear fullness/clogged sensation, sore throat. Reports tmax over the weekend was 101.36F. Taking sudafed, mucinex otc. Reports negative covid and flu tests at home.  Medications: Outpatient Medications Prior to Visit  Medication Sig   allopurinol (ZYLOPRIM) 100 MG tablet TAKE 1 TABLET(100 MG) BY MOUTH TWICE DAILY (Patient taking differently: 100 mg daily. TAKE 1 TABLET(100 MG) BY MOUTH TWICE DAILY)   amLODipine (NORVASC) 10 MG tablet TAKE 1 TABLET DAILY   atorvastatin (LIPITOR) 20 MG tablet TAKE 1 TABLET DAILY   Fluorouracil 5 % SOLN Apply topically.   glucose blood (ONETOUCH ULTRA) test strip USE TO CHECK BLOOD SUGAR DAILY AS DIRECTED.   metFORMIN (GLUCOPHAGE) 1000 MG tablet TAKE 1 TABLET TWICE A DAY WITH MEALS   tirzepatide (MOUNJARO) 7.5 MG/0.5ML Pen Inject 7.5 mg into the skin once a week.   valsartan (DIOVAN) 320 MG tablet Take 1 tablet (320 mg total) by mouth daily.   VITAMIN D PO Take 3,000 Units by mouth daily.   No facility-administered medications prior to visit.    Review of Systems  Constitutional:  Positive for fatigue and fever.  HENT:  Positive for congestion, ear pain and sore throat.   Respiratory:  Positive for cough. Negative for shortness of breath.   Cardiovascular:  Negative for chest pain, palpitations and leg swelling.  Neurological:  Negative for dizziness and headaches.       Objective    BP (!) 148/82   Pulse 67   Temp 98 F (36.7 C) (Oral)   Ht 5\' 11"  (1.803 m)   Wt 193 lb 4 oz (87.7 kg)   SpO2 100%    BMI 26.95 kg/m    Physical Exam Constitutional:      General: He is awake.     Appearance: He is well-developed.  HENT:     Head: Normocephalic.     Right Ear: Tympanic membrane normal.     Ears:     Comments: L TM is erythematous-- top half is bulging   Eyes:     Conjunctiva/sclera: Conjunctivae normal.  Cardiovascular:     Rate and Rhythm: Normal rate and regular rhythm.     Heart sounds: Normal heart sounds.  Pulmonary:     Effort: Pulmonary effort is normal.     Breath sounds: Normal breath sounds.  Skin:    General: Skin is warm.  Neurological:     Mental Status: He is alert and oriented to person, place, and time.  Psychiatric:        Attention and Perception: Attention normal.        Mood and Affect: Mood normal.        Speech: Speech normal.        Behavior: Behavior is cooperative.     No results found for any visits on 10/27/23.  Assessment & Plan    Non-recurrent acute suppurative otitis media of left ear without spontaneous rupture of tympanic membrane -     Amoxicillin-Pot Clavulanate; Take 1 tablet  by mouth 2 (two) times daily for 7 days.  Dispense: 14 tablet; Refill: 0   Rx augmentin x 7 days. Cont otc meds. Chest is clear-- if other symptoms resolve but cough continues would order chest xray r/o atypical pneumonia.  If hearing loss continues, would ref to ENT-- TM appears near rupture, advised on cautions at home if he notices some L ear discharge  Return if symptoms worsen or fail to improve.       Alfredia Ferguson, PA-C  [facility] [phone] (phone) [phone] (fax)  [facility]

## 2023-10-27 NOTE — Telephone Encounter (Signed)
  Chief Complaint: cough, earache Symptoms: productive cough with light green sputum, minor chest pain when coughing, left earache/fullness, crusty discharge/crust to bilateral eyes Frequency: x 7-10 days Pertinent Negatives: Patient denies difficulty breathing Disposition: [] ED /[] Urgent Care (no appt availability in office) / [x] Appointment(In office/virtual)/ []  [place] [facility]/ [] Home Care/ [] Refused Recommended Disposition /[] [place] [facility]/ []  Follow-up with PCP Additional Notes: Patient states he has taken Muccinex and Sudafed OTC and applied a warm compress to his left ear. Patient complaining that his cough is getting to be severe at night and he feels like the right side of his chest is congested. Patient tested negative for COVID and FLU.  Copied from CRM [id number]. Topic: Clinical - Red Word Triage >> Oct 27, 2023  7:55 AM Pascal Lux wrote: Red Word that prompted transfer to Nurse Triage: Patient stated he may have a bacterial infection, mucus coming out of his eye, can't hear out of left ear, cough is not improving especially at night. Reason for Disposition  Earache  Answer Assessment - Initial Assessment Questions 1. ONSET: "When did the cough begin?"      X 10 days.  2. SEVERITY: "How bad is the cough today?"      Mild to moderate during the day and severe at night when lying flat.  3. SPUTUM: "Describe the color of your sputum" (none, dry cough; clear, white, yellow, green)     Light green.  4. HEMOPTYSIS: "Are you coughing up any blood?" If so ask: "How much?" (flecks, streaks, tablespoons, etc.)     Denies.  5. DIFFICULTY BREATHING: "Are you having difficulty breathing?" If Yes, ask: "How bad is it?" (e.g., mild, moderate, severe)    - MILD: No SOB at rest, mild SOB with walking, speaks normally in sentences, can lie down, no retractions, pulse < 100.    - MODERATE: SOB at rest, SOB with minimal exertion and prefers to sit, cannot lie down flat, speaks in  phrases, mild retractions, audible wheezing, pulse 100-120.    - SEVERE: Very SOB at rest, speaks in single words, struggling to breathe, sitting hunched forward, retractions, pulse > 120      Denies difficulty breathing.  6. FEVER: "Do you have a fever?" If Yes, ask: "What is your temperature, how was it measured, and when did it start?"     Fever last weekend 101.7 10/18/23 (patient states he tested negative twice for Flu and COVID).  7. CARDIAC HISTORY: "Do you have any history of heart disease?" (e.g., heart attack, congestive heart failure)      Denies.  8. LUNG HISTORY: "Do you have any history of lung disease?"  (e.g., pulmonary embolus, asthma, emphysema)     Denies.  9. PE RISK FACTORS: "Do you have a history of blood clots?" (or: recent major surgery, recent prolonged travel, bedridden)     Denies.  10. OTHER SYMPTOMS: "Do you have any other symptoms?" (e.g., runny nose, wheezing, chest pain)       Fatigue, minor chest pain from the coughing, runny nose, left earache, bilateral eye discharge (crusty eyelashes)  11. TRAVEL: "Have you traveled out of the country in the last month?" (e.g., travel history, exposures)       Denies recent travel out of the country; states he did travel out of state and symptoms appeared after traveling.  Protocols used: Cough - Acute Productive-A-AH

## 2023-11-03 ENCOUNTER — Encounter: Payer: Self-pay | Admitting: Physician Assistant

## 2023-11-03 DIAGNOSIS — C61 Malignant neoplasm of prostate: Secondary | ICD-10-CM | POA: Diagnosis not present

## 2023-11-04 ENCOUNTER — Other Ambulatory Visit: Payer: Self-pay | Admitting: Physician Assistant

## 2023-11-04 DIAGNOSIS — H66002 Acute suppurative otitis media without spontaneous rupture of ear drum, left ear: Secondary | ICD-10-CM

## 2023-11-04 DIAGNOSIS — H9192 Unspecified hearing loss, left ear: Secondary | ICD-10-CM

## 2023-11-05 ENCOUNTER — Ambulatory Visit: Payer: BC Managed Care – PPO | Admitting: Family

## 2023-11-05 ENCOUNTER — Ambulatory Visit: Payer: Self-pay | Admitting: Family Medicine

## 2023-11-05 VITALS — BP 136/74 | HR 75 | Temp 97.5°F | Resp 16 | Ht 71.0 in | Wt 194.0 lb

## 2023-11-05 DIAGNOSIS — H6692 Otitis media, unspecified, left ear: Secondary | ICD-10-CM | POA: Insufficient documentation

## 2023-11-05 MED ORDER — AMOXICILLIN-POT CLAVULANATE 875-125 MG PO TABS: 1.0000 | ORAL_TABLET | Freq: Two times a day (BID) | ORAL | 0 refills | Status: DC

## 2023-11-05 NOTE — Telephone Encounter (Signed)
 Copied from CRM [id number]. Topic: Clinical - Red Word Triage >> Nov 05, 2023  8:35 AM Gerardine PARAS wrote: Red Word that prompted transfer to Nurse Triage: Patient representative called in regarding scheduling patient due to loss of hearing and worsening in left ear after an ear infection   Chief Complaint: Left ear hearing decreased Symptoms: hearing decreased Frequency: since last visit with PCP for same Pertinent Negatives: Patient denies pain Disposition: [] ED /[] Urgent Care (no appt availability in office) / [x] Appointment(In office/virtual)/ []  [place] [facility]/ [] Home Care/ [] Refused Recommended Disposition /[] [place] [facility]/ []  Follow-up with PCP Additional Notes: Patient's executive assistant called and wanted to set up an appointment for the patient.  Patient is booked with back to back meetings but has decreased hearing in his left ear at this time.  He denies any pain at this time. Patient has a history of some hearing loss and got hearing aids last year and has had a recent sinus infection that his PCP saw him for.  Left ear is the ear of concern that has recently had problems.  Patient messaged his PCP through MyChart and he was advised to come in and let them take a look in his ear and possibly refer him to an ENT. Appointment made today 11/05/2023 with Melissa O'Sullivan at 3pm.  She is advised that if the patient has any worsening he can go to the emergency room.  She verbalized understanding but states the patient is not having any pain and this is just a follow up from previous communication with his PCP  Reason for Disposition  Decreased hearing following an ear infection  Protocols used: Hearing Loss or Change-A-AH

## 2023-11-05 NOTE — Patient Instructions (Signed)
 VISIT SUMMARY:  Today, we discussed your worsening hearing loss and ear pain following a recent left ear infection. We also reviewed your blood pressure management and general health maintenance.  YOUR PLAN:  -LEFT EAR INFECTION: You have a persistent ear infection in your left ear, which has caused significant hearing loss and pressure. We will extend your Augmentin  treatment for an additional 3 days and suggest using Flonase to help with drainage. If your symptoms do not improve, you have an ENT appointment scheduled for 01/01/2024.  -GENERAL HEALTH MAINTENANCE: Continue managing your diabetes with your current GLP-1 drugs and keep monitoring your blood sugar levels as usual.  INSTRUCTIONS:  Please follow up with your ENT specialist on 01/01/2024 if your ear symptoms persist. Additionally, we will recheck your blood pressure at your next visit on 11/27/2023 or 11/28/2023.

## 2023-11-05 NOTE — Progress Notes (Signed)
 Subjective:     Patient ID: Steve Beasley, male    DOB: 11-14-1965, 58 y.o.   MRN: [id number]  Chief Complaint  Patient presents with   Hearing Problem    Patient reports losing hearing on left ear after recent ear infectionl    HPI  Discussed the use of AI scribe software for clinical note transcription with the patient, who gave verbal consent to proceed.  History of Present Illness   Steve Beasley is a 57 year old male who presents with worsening hearing loss and ear pain following treatment for a left ear infection.  He initially experienced a left ear infection on January 27th and was prescribed Augmentin  for seven days, which he completed a couple of days ago. The pain initially subsided within a day or two of starting the medication, but his hearing progressively worsened. In the last 24 hours, he has experienced increased pressure and a slight return of pain in the left ear. He estimates an 80-90% reduction in hearing in the affected ear, which is more severe than his baseline hearing loss. He is unable to hear the TV or his wife speaking when the right ear is occluded, although he can perceive vibrations from loud noises.  He mentions a recent history of a cold that progressed to 'gunky snot' in his eyes and then an earache. He tested negative for COVID-19 and flu. He suspects residual fluid or pus in the eustachian tube or inner ear as a contributing factor to his symptoms.  He notes stress from work, frequent caffeine consumption, and use of nicotine products, which he acknowledges may contribute to his elevated blood pressure. He has a history of high blood pressure and mentions not taking his medication consistently, with typical morning readings of 115-125/65, which increase throughout the day.        Health Maintenance Due  Topic Date Due   Pneumococcal Vaccine 59-74 Years old (1 of 2 - PCV) Never done   HIV Screening  Never done   Zoster Vaccines- Shingrix (1  of 2) Never done   COVID-19 Vaccine (4 - 2024-25 season) 06/01/2023    Past Medical History:  Diagnosis Date   Allergy-induced asthma    Anxiety state, unspecified 09/19/2013   Atypical chest pain 11/13/2014   Basal cell carcinoma of scalp 09/09/2016   and back   Diabetes mellitus without complication (HCC)    on meds   Fatty liver disease, nonalcoholic 09/19/2013   Gout 09/18/2014   Hypertension    Melanoma (HCC)    left upper arm   Nicotine use disorder 06/06/2013   smokeless   Obesity, unspecified 06/06/2013   Osteopenia    Other and unspecified hyperlipidemia 01/19/2014   Preventative health care 09/19/2013   Prostate cancer (HCC) 12/26/2020   Right heart enlargement 09/18/2014   Testicular cancer (HCC)    Tinnitus 03/24/2017   Valvular heart disease 09/18/2014    Past Surgical History:  Procedure Laterality Date   BASAL CELL CARCINOMA EXCISION     Within 6 months   COLONOSCOPY  2020   GM-MAC-goly(good)-HPP/TA - 3 yr recall   POLYPECTOMY     SKIN CANCER EXCISION     Within 6 months   SURGERY SCROTAL / TESTICULAR  1999   cancer   WISDOM TOOTH EXTRACTION  1982    Family History  Problem Relation Age of Onset   Diabetes Mother    Breast cancer Mother    Bone cancer Mother  Cancer Mother        breast with bone mets   Depression Mother    Hypertension Father    Prostate cancer Father    Hyperlipidemia Father    Cancer Father        prostate   Colon polyps Father 75   Hypertension Brother    Gallstones Brother    Anxiety disorder Maternal Aunt        social anxiety, anger   Stroke Maternal Uncle 69   Prostate cancer Maternal Grandfather    Stroke Paternal Grandfather    Anxiety disorder Daughter    ADD / ADHD Daughter    Bulemia Daughter    Depression Son    Heart attack Neg Hx    Sudden death Neg Hx    Colon cancer Neg Hx    Stomach cancer Neg Hx    Rectal cancer Neg Hx    Esophageal cancer Neg Hx     Social History   Socioeconomic  History   Marital status: Married    Spouse name: Not on file   Number of children: 2   Years of education: Not on file   Highest education level: Not on file  Occupational History   Occupation: finance  Tobacco Use   Smoking status: Former    Types: Cigarettes   Smokeless tobacco: Current    Types: Snuff, Chew    Last attempt to quit: 02/28/2014   Tobacco comments:    30 years ago socially/ 06/10/19 pt  uses oral nicotine chew tobacco free   Vaping Use   Vaping status: Never Used  Substance and Sexual Activity   Alcohol use: Yes    Alcohol/week: 14.0 standard drinks of alcohol    Types: 14 Standard drinks or equivalent per week    Comment: 2 drinks per day varies    Drug use: No   Sexual activity: Yes    Comment: lives with wife, travels with work, no dietary restrictions , exercise regular  Other Topics Concern   Not on file  Social History Narrative   Works at [employer] Strain: Not on file  Food Insecurity: No Food Insecurity (06/16/2023)   Received from [facility]   Hunger Vital Sign    Worried About Running Out of Food in the Last Year: Never true    Ran Out of Food in the Last Year: Never true  Transportation Needs: No Transportation Needs (06/16/2023)   Received from [facility] - Transportation    Lack of Transportation (Medical): No    Lack of Transportation (Non-Medical): No  Physical Activity: Insufficiently Active (06/16/2023)   Received from [facility]   Exercise Vital Sign    Days of Exercise per Week: 2 days    Minutes of Exercise per Session: 30 min  Stress: Not on file  Social Connections: Not on file  Intimate Partner Violence: Not At Risk (04/28/2023)   Humiliation, Afraid, Rape, and Kick questionnaire    Fear of Current or Ex-Partner: No    Emotionally Abused: No    Physically Abused: No    Sexually Abused: No    Outpatient Medications Prior to Visit  Medication Sig Dispense Refill    allopurinol  (ZYLOPRIM ) 100 MG tablet TAKE 1 TABLET(100 MG) BY MOUTH TWICE DAILY (Patient taking differently: 100 mg daily. TAKE 1 TABLET(100 MG) BY MOUTH TWICE DAILY) 180 tablet 1   amLODipine  (NORVASC ) 10 MG tablet TAKE  1 TABLET DAILY 90 tablet 0   atorvastatin  (LIPITOR) 20 MG tablet TAKE 1 TABLET DAILY 90 tablet 0   Fluorouracil 5 % SOLN Apply topically.     glucose blood (ONETOUCH ULTRA) test strip USE TO CHECK BLOOD SUGAR DAILY AS DIRECTED. 100 strip 0   metFORMIN  (GLUCOPHAGE ) 1000 MG tablet TAKE 1 TABLET TWICE A DAY WITH MEALS 180 tablet 0   tirzepatide  (MOUNJARO ) 7.5 MG/0.5ML Pen Inject 7.5 mg into the skin once a week. 6 mL 1   valsartan  (DIOVAN ) 320 MG tablet Take 1 tablet (320 mg total) by mouth daily. 90 tablet 0   VITAMIN D  PO Take 3,000 Units by mouth daily.     No facility-administered medications prior to visit.    Allergies  Allergen Reactions   Januvia  [Sitagliptin ] Rash    ROS See hpi    Objective:    Physical Exam Constitutional:      Appearance: Normal appearance.  HENT:     Head: Normocephalic and atraumatic.     Right Ear: Tympanic membrane and ear canal normal.     Left Ear: Ear canal normal. Tympanic membrane is erythematous (mild erythema at 12 oclock) and retracted.  Eyes:     Extraocular Movements: Extraocular movements intact.  Cardiovascular:     Rate and Rhythm: Normal rate.  Pulmonary:     Effort: Pulmonary effort is normal.  Musculoskeletal:     Cervical back: Neck supple.  Lymphadenopathy:     Cervical: No cervical adenopathy.  Neurological:     Mental Status: He is alert and oriented to person, place, and time.  Psychiatric:        Mood and Affect: Mood normal.        Behavior: Behavior normal.        Thought Content: Thought content normal.      BP 136/74   Pulse 75   Temp (!) 97.5 F (36.4 C) (Oral)   Resp 16   Ht 5' 11 (1.803 m)   Wt 194 lb (88 kg)   SpO2 100%   BMI 27.06 kg/m  Wt Readings from Last 3 Encounters:   11/05/23 194 lb (88 kg)  10/27/23 193 lb 4 oz (87.7 kg)  06/03/23 190 lb (86.2 kg)       Assessment & Plan:   Problem List Items Addressed This Visit       Unprioritized   Left otitis media - Primary    Recent treatment with Augmentin  for 7 days. Pain resolved but hearing loss and pressure persist. Mild erythema noted on examination. -Extend Augmentin  for 3 more days. -Advised it can take several weeks for hearing to return following OM with effusion.  -Consider use of Flonase to facilitate drainage. -ENT appointment scheduled for 01/01/2024 if symptoms persist.      Relevant Medications   amoxicillin -clavulanate (AUGMENTIN ) 875-125 MG tablet    I am having Belvie PARAS. Leedy start on amoxicillin -clavulanate. I am also having him maintain his VITAMIN D  PO, OneTouch Ultra, allopurinol , Fluorouracil, amLODipine , metFORMIN , atorvastatin , valsartan , and tirzepatide .  Meds ordered this encounter  Medications   amoxicillin -clavulanate (AUGMENTIN ) 875-125 MG tablet    Sig: Take 1 tablet by mouth 2 (two) times daily.    Dispense:  6 tablet    Refill:  0    Supervising Provider:   DOMENICA BLACKBIRD A [[id number]]

## 2023-11-05 NOTE — Assessment & Plan Note (Signed)
  Recent treatment with Augmentin  for 7 days. Pain resolved but hearing loss and pressure persist. Mild erythema noted on examination. -Extend Augmentin  for 3 more days. -Advised it can take several weeks for hearing to return following OM with effusion.  -Consider use of Flonase to facilitate drainage. -ENT appointment scheduled for 01/01/2024 if symptoms persist.

## 2023-11-10 ENCOUNTER — Other Ambulatory Visit: Payer: Self-pay | Admitting: Family Medicine

## 2023-11-10 DIAGNOSIS — C61 Malignant neoplasm of prostate: Secondary | ICD-10-CM | POA: Diagnosis not present

## 2023-11-10 DIAGNOSIS — R351 Nocturia: Secondary | ICD-10-CM | POA: Diagnosis not present

## 2023-11-11 ENCOUNTER — Other Ambulatory Visit: Payer: Self-pay | Admitting: Family Medicine

## 2023-11-27 DIAGNOSIS — C61 Malignant neoplasm of prostate: Secondary | ICD-10-CM | POA: Diagnosis not present

## 2023-11-27 NOTE — Assessment & Plan Note (Signed)
 Lab Results  Component Value Date   CHOL 80 02/05/2023   HDL 36.60 (L) 02/05/2023   LDLCALC 24 02/05/2023   LDLDIRECT 51.0 12/18/2020   TRIG 95.0 02/05/2023   CHOLHDL 2 02/05/2023   Lipids look great.  Continue statin.  Update lipids next visit.

## 2023-11-27 NOTE — Assessment & Plan Note (Signed)
 Update bili levels.

## 2023-11-27 NOTE — Assessment & Plan Note (Addendum)
 Lab Results  Component Value Date   HGBA1C 5.9 05/26/2023   HGBA1C 6.1 02/05/2023   HGBA1C 6.6 (H) 07/18/2022   Lab Results  Component Value Date   MICROALBUR 4.3 (H) 02/05/2023   LDLCALC 24 02/05/2023   CREATININE 1.27 05/26/2023   Wt Readings from Last 3 Encounters:  11/05/23 194 lb (88 kg)  10/27/23 193 lb 4 oz (87.7 kg)  06/03/23 190 lb (86.2 kg)   He is maintained on mounjaro. Has been well controlled. Update A1C.

## 2023-11-27 NOTE — Assessment & Plan Note (Signed)
 Update PTH.

## 2023-11-27 NOTE — Assessment & Plan Note (Signed)
 Maintained on diovan and amlodipine. Repeat BP at goal.   BP Readings from Last 3 Encounters:  11/28/23 125/65  11/05/23 136/74  10/27/23 (!) 148/82

## 2023-11-27 NOTE — Assessment & Plan Note (Signed)
 Needs follow up dexa.

## 2023-11-27 NOTE — Progress Notes (Unsigned)
 Subjective:     Patient ID: Steve Beasley, male    DOB: July 21, 1966, 58 y.o.   MRN: [id number]  No chief complaint on file.   HPI  Discussed the use of AI scribe software for clinical note transcription with the patient, who gave verbal consent to proceed.  History of Present Illness       Health Maintenance Due  Topic Date Due   Pneumococcal Vaccine 62-87 Years old (1 of 2 - PCV) Never done   HIV Screening  Never done   Zoster Vaccines- Shingrix (1 of 2) Never done   COVID-19 Vaccine (4 - 2024-25 season) 06/01/2023   HEMOGLOBIN A1C  11/26/2023    Past Medical History:  Diagnosis Date   Allergy-induced asthma    Anxiety state, unspecified 09/19/2013   Atypical chest pain 11/13/2014   Basal cell carcinoma of scalp 09/09/2016   and back   Diabetes mellitus without complication (HCC)    on meds   Fatty liver disease, nonalcoholic 09/19/2013   Gout 09/18/2014   Hypertension    Melanoma (HCC)    left upper arm   Nicotine use disorder 06/06/2013   smokeless   Obesity, unspecified 06/06/2013   Osteopenia    Other and unspecified hyperlipidemia 01/19/2014   Preventative health care 09/19/2013   Prostate cancer (HCC) 12/26/2020   Right heart enlargement 09/18/2014   Testicular cancer (HCC)    Tinnitus 03/24/2017   Valvular heart disease 09/18/2014    Past Surgical History:  Procedure Laterality Date   BASAL CELL CARCINOMA EXCISION     Within 6 months   COLONOSCOPY  2020   GM-MAC-goly(good)-HPP/TA - 3 yr recall   POLYPECTOMY     SKIN CANCER EXCISION     Within 6 months   SURGERY SCROTAL / TESTICULAR  1999   cancer   WISDOM TOOTH EXTRACTION  1982    Family History  Problem Relation Age of Onset   Diabetes Mother    Breast cancer Mother    Bone cancer Mother    Cancer Mother        breast with bone mets   Depression Mother    Hypertension Father    Prostate cancer Father    Hyperlipidemia Father    Cancer Father        prostate   Colon polyps  Father 51   Hypertension Brother    Gallstones Brother    Anxiety disorder Maternal Aunt        social anxiety, anger   Stroke Maternal Uncle 65   Prostate cancer Maternal Grandfather    Stroke Paternal Grandfather    Anxiety disorder Daughter    ADD / ADHD Daughter    Bulemia Daughter    Depression Son    Heart attack Neg Hx    Sudden death Neg Hx    Colon cancer Neg Hx    Stomach cancer Neg Hx    Rectal cancer Neg Hx    Esophageal cancer Neg Hx     Social History   Socioeconomic History   Marital status: Married    Spouse name: Not on file   Number of children: 2   Years of education: Not on file   Highest education level: Not on file  Occupational History   Occupation: finance  Tobacco Use   Smoking status: Former    Types: Cigarettes   Smokeless tobacco: Current    Types: Snuff, Chew    Last attempt to quit: 02/28/2014   Tobacco  comments:    30 years ago socially/ 06/10/19 pt  uses oral nicotine chew tobacco free   Vaping Use   Vaping status: Never Used  Substance and Sexual Activity   Alcohol use: Yes    Alcohol/week: 14.0 standard drinks of alcohol    Types: 14 Standard drinks or equivalent per week    Comment: 2 drinks per day varies    Drug use: No   Sexual activity: Yes    Comment: lives with wife, travels with work, no dietary restrictions , exercise regular  Other Topics Concern   Not on file  Social History Narrative   Works at [employer] Strain: Not on file  Food Insecurity: No Food Insecurity (06/16/2023)   Received from [facility]   Hunger Vital Sign    Worried About Running Out of Food in the Last Year: Never true    Ran Out of Food in the Last Year: Never true  Transportation Needs: No Transportation Needs (06/16/2023)   [facility] - Transportation    Lack of Transportation (Medical): No    Lack of Transportation (Non-Medical): No  Physical Activity: Insufficiently  Active (06/16/2023)   Received from [facility]   Exercise Vital Sign    Days of Exercise per Week: 2 days    Minutes of Exercise per Session: 30 min  Stress: Not on file  Social Connections: Not on file  Intimate Partner Violence: Not At Risk (04/28/2023)   Humiliation, Afraid, Rape, and Kick questionnaire    Fear of Current or Ex-Partner: No    Emotionally Abused: No    Physically Abused: No    Sexually Abused: No    Outpatient Medications Prior to Visit  Medication Sig Dispense Refill   allopurinol (ZYLOPRIM) 100 MG tablet TAKE 1 TABLET(100 MG) BY MOUTH TWICE DAILY (Patient taking differently: 100 mg daily. TAKE 1 TABLET(100 MG) BY MOUTH TWICE DAILY) 180 tablet 1   amLODipine (NORVASC) 10 MG tablet TAKE 1 TABLET DAILY 90 tablet 3   amoxicillin-clavulanate (AUGMENTIN) 875-125 MG tablet Take 1 tablet by mouth 2 (two) times daily. 6 tablet 0   atorvastatin (LIPITOR) 20 MG tablet TAKE 1 TABLET DAILY 90 tablet 3   Fluorouracil 5 % SOLN Apply topically.     glucose blood (ONETOUCH ULTRA) test strip USE TO CHECK BLOOD SUGAR DAILY AS DIRECTED. 100 strip 0   metFORMIN (GLUCOPHAGE) 1000 MG tablet TAKE 1 TABLET TWICE A DAY WITH MEALS 180 tablet 3   tirzepatide (MOUNJARO) 7.5 MG/0.5ML Pen Inject 7.5 mg into the skin once a week. 6 mL 1   valsartan (DIOVAN) 320 MG tablet Take 1 tablet (320 mg total) by mouth daily. 90 tablet 0   VITAMIN D PO Take 3,000 Units by mouth daily.     No facility-administered medications prior to visit.    Allergies  Allergen Reactions   Januvia [Sitagliptin] Rash    ROS     Objective:    Physical Exam   There were no vitals taken for this visit. Wt Readings from Last 3 Encounters:  11/05/23 194 lb (88 kg)  10/27/23 193 lb 4 oz (87.7 kg)  06/03/23 190 lb (86.2 kg)       Assessment & Plan:   Problem List Items Addressed This Visit   None   I am having Steve Beasley. Steve Beasley maintain his VITAMIN D PO, OneTouch Ultra, allopurinol, Fluorouracil,  tirzepatide, amoxicillin-clavulanate, atorvastatin, amLODipine,  metFORMIN, and valsartan.  No orders of the defined types were placed in this encounter.

## 2023-11-28 ENCOUNTER — Telehealth: Payer: Self-pay | Admitting: Family

## 2023-11-28 ENCOUNTER — Ambulatory Visit: Payer: BC Managed Care – PPO | Admitting: Family

## 2023-11-28 VITALS — BP 125/65 | HR 66 | Temp 97.6°F | Resp 16 | Ht 71.0 in | Wt 194.6 lb

## 2023-11-28 DIAGNOSIS — E785 Hyperlipidemia, unspecified: Secondary | ICD-10-CM | POA: Diagnosis not present

## 2023-11-28 DIAGNOSIS — M109 Gout, unspecified: Secondary | ICD-10-CM | POA: Diagnosis not present

## 2023-11-28 DIAGNOSIS — R17 Unspecified jaundice: Secondary | ICD-10-CM | POA: Diagnosis not present

## 2023-11-28 DIAGNOSIS — E213 Hyperparathyroidism, unspecified: Secondary | ICD-10-CM | POA: Diagnosis not present

## 2023-11-28 DIAGNOSIS — E119 Type 2 diabetes mellitus without complications: Secondary | ICD-10-CM

## 2023-11-28 DIAGNOSIS — M8580 Other specified disorders of bone density and structure, unspecified site: Secondary | ICD-10-CM

## 2023-11-28 DIAGNOSIS — I10 Essential (primary) hypertension: Secondary | ICD-10-CM | POA: Diagnosis not present

## 2023-11-28 DIAGNOSIS — Z7985 Long-term (current) use of injectable non-insulin antidiabetic drugs: Secondary | ICD-10-CM

## 2023-11-28 DIAGNOSIS — H6692 Otitis media, unspecified, left ear: Secondary | ICD-10-CM

## 2023-11-28 DIAGNOSIS — C61 Malignant neoplasm of prostate: Secondary | ICD-10-CM

## 2023-11-28 LAB — MICROALBUMIN / CREATININE URINE RATIO
Creatinine,U: 172.4 mg/dL
Microalb Creat Ratio: 33 mg/g — ABNORMAL HIGH (ref 0.0–30.0)
Microalb, Ur: 5.7 mg/dL — ABNORMAL HIGH (ref 0.0–1.9)

## 2023-11-28 LAB — COMPREHENSIVE METABOLIC PANEL
ALT: 37 U/L (ref 0–53)
AST: 20 U/L (ref 0–37)
Albumin: 4.3 g/dL (ref 3.5–5.2)
Alkaline Phosphatase: 116 U/L (ref 39–117)
BUN: 17 mg/dL (ref 6–23)
CO2: 27 meq/L (ref 19–32)
Calcium: 10.7 mg/dL — ABNORMAL HIGH (ref 8.4–10.5)
Chloride: 104 meq/L (ref 96–112)
Creatinine, Ser: 1.2 mg/dL (ref 0.40–1.50)
GFR: 66.6 mL/min (ref >60.00)
Glucose, Bld: 167 mg/dL — ABNORMAL HIGH (ref 70–99)
Potassium: 4.4 meq/L (ref 3.5–5.1)
Sodium: 138 meq/L (ref 135–145)
Total Bilirubin: 1.3 mg/dL — ABNORMAL HIGH (ref 0.2–1.2)
Total Protein: 6.7 g/dL (ref 6.0–8.3)

## 2023-11-28 LAB — VITAMIN D 25 HYDROXY (VIT D DEFICIENCY, FRACTURES): VITD: 14.57 ng/mL — ABNORMAL LOW (ref 30.00–100.00)

## 2023-11-28 LAB — URIC ACID: Uric Acid, Serum: 7.1 mg/dL (ref 4.0–7.8)

## 2023-11-28 LAB — HEMOGLOBIN A1C: Hgb A1c MFr Bld: 6.2 % (ref 4.6–6.5)

## 2023-11-28 MED ORDER — VITAMIN D (ERGOCALCIFEROL) 1.25 MG (50000 UNIT) PO CAPS: 50000.0000 [IU] | ORAL_CAPSULE | ORAL | 0 refills | Status: DC

## 2023-11-28 NOTE — Telephone Encounter (Signed)
 See mychart.

## 2023-11-28 NOTE — Assessment & Plan Note (Signed)
 Resolved

## 2023-11-28 NOTE — Assessment & Plan Note (Addendum)
 Patient has chosen surveillance.  Working with a Emergency planning/management officer (Dr. Marlou Porch), also saw Dr. Laverle Patter.  He hopes to get focal treatment.

## 2023-11-28 NOTE — Patient Instructions (Signed)
 VISIT SUMMARY:  Today, we discussed your prostate cancer, recent hearing loss, hypertension, gout, diabetes, and general health maintenance. We reviewed your current treatments and made plans for further monitoring and testing.  YOUR PLAN:  -PROSTATE CANCER: Prostate cancer is a condition where cancer cells form in the tissues of the prostate. Your Gleason score is 7, and your PSA levels are rising. You are currently monitoring the condition with your urologist, Dr. Marlou Porch, and considering treatment options such as focal treatment or surgery. We will continue to monitor your PSA levels and MRI findings to guide future decisions.  -HYPERPARATHYROIDISM: Hyperparathyroidism is a condition where the parathyroid glands produce too much hormone, leading to high calcium levels in the blood. We will check your parathyroid hormone and calcium levels today to monitor this condition.  -GOUT: Gout is a form of arthritis characterized by severe pain, redness, and tenderness in joints. You have reduced your allopurinol dose to once daily and are not experiencing symptoms. We will check your uric acid level today to ensure it remains under control.  -VITAMIN D DEFICIENCY: Vitamin D deficiency occurs when you do not have enough vitamin D in your body, which is essential for bone health. We will check your vitamin D level today to see if you need supplementation.  -GENERAL HEALTH MAINTENANCE: We will order a bone density scan due to your history of hyperparathyroidism and long-term use of allopurinol. This scan will help [place] assess your bone health. Please follow up in 3 months for a review of your condition and any new test results.  INSTRUCTIONS:  Please follow up in 3 months for a review of your condition and any new test results.

## 2023-11-28 NOTE — Assessment & Plan Note (Signed)
 Update calcium today.

## 2023-11-29 LAB — BILIRUBIN, FRACTIONATED(TOT/DIR/INDIR)
Bilirubin, Direct: 0.3 mg/dL — ABNORMAL HIGH (ref 0.0–0.2)
Indirect Bilirubin: 0.9 mg/dL (ref 0.2–1.2)
Total Bilirubin: 1.2 mg/dL (ref 0.2–1.2)

## 2023-11-29 LAB — PARATHYROID HORMONE, INTACT (NO CA): PTH: 59 pg/mL (ref 16–77)

## 2023-11-30 ENCOUNTER — Encounter: Payer: Self-pay | Admitting: Family

## 2023-12-25 ENCOUNTER — Encounter: Payer: Self-pay | Admitting: Family

## 2023-12-25 ENCOUNTER — Ambulatory Visit (HOSPITAL_BASED_OUTPATIENT_CLINIC_OR_DEPARTMENT_OTHER)
Admission: RE | Admit: 2023-12-25 | Discharge: 2023-12-25 | Disposition: A | Discharge status: Home / Self Care | Payer: BC Managed Care – PPO | Source: Ambulatory Visit | Attending: Family | Admitting: Family

## 2023-12-25 DIAGNOSIS — M85852 Other specified disorders of bone density and structure, left thigh: Secondary | ICD-10-CM | POA: Diagnosis not present

## 2023-12-25 DIAGNOSIS — M8580 Other specified disorders of bone density and structure, unspecified site: Secondary | ICD-10-CM | POA: Insufficient documentation

## 2023-12-31 ENCOUNTER — Encounter (INDEPENDENT_AMBULATORY_CARE_PROVIDER_SITE_OTHER): Payer: Self-pay

## 2024-01-01 ENCOUNTER — Ambulatory Visit (INDEPENDENT_AMBULATORY_CARE_PROVIDER_SITE_OTHER): Payer: BC Managed Care – PPO | Admitting: Audiology

## 2024-01-01 ENCOUNTER — Institutional Professional Consult (permissible substitution) (INDEPENDENT_AMBULATORY_CARE_PROVIDER_SITE_OTHER): Payer: BC Managed Care – PPO

## 2024-02-25 DIAGNOSIS — C61 Malignant neoplasm of prostate: Secondary | ICD-10-CM | POA: Diagnosis not present

## 2024-03-04 ENCOUNTER — Other Ambulatory Visit: Payer: Self-pay | Admitting: Family Medicine

## 2024-03-08 DIAGNOSIS — R3912 Poor urinary stream: Secondary | ICD-10-CM | POA: Diagnosis not present

## 2024-03-08 DIAGNOSIS — N5201 Erectile dysfunction due to arterial insufficiency: Secondary | ICD-10-CM | POA: Diagnosis not present

## 2024-03-08 DIAGNOSIS — C61 Malignant neoplasm of prostate: Secondary | ICD-10-CM | POA: Diagnosis not present

## 2024-03-09 ENCOUNTER — Other Ambulatory Visit: Payer: Self-pay | Admitting: Urology

## 2024-03-09 DIAGNOSIS — C61 Malignant neoplasm of prostate: Secondary | ICD-10-CM

## 2024-04-08 DIAGNOSIS — D2260 Melanocytic nevi of unspecified upper limb, including shoulder: Secondary | ICD-10-CM | POA: Diagnosis not present

## 2024-04-08 DIAGNOSIS — L821 Other seborrheic keratosis: Secondary | ICD-10-CM | POA: Diagnosis not present

## 2024-04-08 DIAGNOSIS — Z85828 Personal history of other malignant neoplasm of skin: Secondary | ICD-10-CM | POA: Diagnosis not present

## 2024-04-08 DIAGNOSIS — L578 Other skin changes due to chronic exposure to nonionizing radiation: Secondary | ICD-10-CM | POA: Diagnosis not present

## 2024-05-18 ENCOUNTER — Other Ambulatory Visit: Payer: Self-pay | Admitting: Family Medicine

## 2024-06-01 ENCOUNTER — Ambulatory Visit: Admitting: Family

## 2024-06-01 ENCOUNTER — Other Ambulatory Visit: Payer: Self-pay | Admitting: Family

## 2024-06-01 VITALS — BP 139/67 | HR 65 | Temp 98.2°F | Resp 12 | Ht 71.0 in | Wt 199.8 lb

## 2024-06-01 DIAGNOSIS — E785 Hyperlipidemia, unspecified: Secondary | ICD-10-CM

## 2024-06-01 DIAGNOSIS — R972 Elevated prostate specific antigen [PSA]: Secondary | ICD-10-CM | POA: Diagnosis not present

## 2024-06-01 DIAGNOSIS — Z23 Encounter for immunization: Secondary | ICD-10-CM

## 2024-06-01 DIAGNOSIS — E559 Vitamin D deficiency, unspecified: Secondary | ICD-10-CM | POA: Diagnosis not present

## 2024-06-01 DIAGNOSIS — E119 Type 2 diabetes mellitus without complications: Secondary | ICD-10-CM

## 2024-06-01 DIAGNOSIS — C61 Malignant neoplasm of prostate: Secondary | ICD-10-CM

## 2024-06-01 DIAGNOSIS — Z7984 Long term (current) use of oral hypoglycemic drugs: Secondary | ICD-10-CM

## 2024-06-01 DIAGNOSIS — G4733 Obstructive sleep apnea (adult) (pediatric): Secondary | ICD-10-CM

## 2024-06-01 DIAGNOSIS — I10 Essential (primary) hypertension: Secondary | ICD-10-CM

## 2024-06-01 DIAGNOSIS — F17200 Nicotine dependence, unspecified, uncomplicated: Secondary | ICD-10-CM

## 2024-06-01 NOTE — Patient Instructions (Signed)
 VISIT SUMMARY:  You had a follow-up visit to review your medications and overall health. Your diabetes and hypertension are well-managed, and your previous cold and ear issues have resolved. We discussed your weight, exercise habits, and upcoming vaccinations for international travel. We also reviewed your prostate cancer surveillance and other health concerns.  YOUR PLAN:  PROSTATE CANCER, ACTIVE SURVEILLANCE: Your prostate cancer is under active surveillance with recent biopsies showing no evidence of cancer, but PSA levels remain elevated. -Order a PSA test. -Proceed with MRI on September 11. -Consider focal treatments like high frequency ultrasound or other ablative treatments if MRI results are definitive. -Continue active surveillance.  TYPE 2 DIABETES MELLITUS: Your diabetes is well-controlled with an A1c of 6.2%, though your weight has increased by 5 pounds. -Order an A1c test. -Continue Mounjaro  7.5 mg. -Continue metformin  1000 mg twice daily.  OBESITY: Your weight has increased by 5 pounds but remains relatively stable. -Continue Mounjaro  7.5 mg. -Encourage regular exercise.  HYPERTENSION: Your blood pressure is managed with amlodipine  and valsartan , currently at an acceptable level of 139 mmHg. -Continue amlodipine  10 mg. -Continue valsartan  320 mg.  HYPERLIPIDEMIA: Your cholesterol levels have not been checked in over a year. -Order a cholesterol test. -Continue Lipitor.  VITAMIN D  DEFICIENCY: You have a history of vitamin D  deficiency and are not taking daily supplements as recommended. -Order a vitamin D  level test.

## 2024-06-01 NOTE — Assessment & Plan Note (Signed)
 Stable on amlodipine  and valsartan . Continue same.

## 2024-06-01 NOTE — Assessment & Plan Note (Addendum)
 Lab Results  Component Value Date   HGBA1C 6.2 11/28/2023   HGBA1C 5.9 05/26/2023   HGBA1C 6.1 02/05/2023   Lab Results  Component Value Date   MICROALBUR 5.7 (H) 11/28/2023   LDLCALC 24 02/05/2023   CREATININE 1.20 11/28/2023   Doing well on mounjaro  7.5mg  and metformin . Update A1C.

## 2024-06-01 NOTE — Assessment & Plan Note (Signed)
 Not snoring.  Has not used cpap in years. Had previously negative sleep study.

## 2024-06-01 NOTE — Progress Notes (Signed)
 Subjective:     Patient ID: Steve Beasley, male    DOB: Dec 14, 1965, 58 y.o.   MRN: [id number]  Chief Complaint  Patient presents with   Medication Refill    Medication Refill    Discussed the use of AI scribe software for clinical note transcription with the patient, who gave verbal consent to proceed.  History of Present Illness  Steve Beasley is a 58 year old male with diabetes and hypertension who presents for a follow-up on his medications.  He feels well overall. His previous cold and ear issues have resolved. He is on Mounjaro  7.5 mg, metformin  1000 mg twice daily, Lipitor, amlodipine  10 mg, and valsartan  320 mg. His last A1c was 6.2 in February. He anticipates a slight increase due to a 5-pound weight gain. He exercises once a week and has reduced coffee intake to manage his blood pressure, which he monitors at home, noting it is usually good in the morning.  He has experienced weight loss and no longer snores. He continues to have cold hands and feet and has not quit nicotine. He does not use a CPAP machine, and his wife reports he no longer snores or experiences apnea during sleep.  He is considering vaccinations for upcoming international travel, including pneumonia, flu, and COVID vaccines. He has had reactions to the COVID vaccine previously but plans to receive it soon. He is also contemplating the shingles vaccine for a later date.  He has a history of prostate cancer with a high PSA level, previously recorded at 11-12. He has undergone multiple biopsies with varying Gleason scores and is currently monitoring the situation with MRIs and potential future biopsies.  He has low testosterone  levels but is not pursuing treatment due to his prostate cancer history. He notes a decrease in body hair and cold extremities.     Health Maintenance Due  Topic Date Due   HIV Screening  Never done   Pneumococcal Vaccine: 50+ Years (1 of 2 - PCV) Never done   Hepatitis B  Vaccines 19-59 Average Risk (1 of 3 - 19+ 3-dose series) Never done   INFLUENZA VACCINE  04/30/2024   HEMOGLOBIN A1C  05/27/2024    Past Medical History:  Diagnosis Date   Allergy-induced asthma    Anxiety state, unspecified 09/19/2013   Atypical chest pain 11/13/2014   Basal cell carcinoma of scalp 09/09/2016   and back   Diabetes mellitus without complication (HCC)    on meds   Fatty liver disease, nonalcoholic 09/19/2013   Gout 09/18/2014   Hypertension    Melanoma (HCC)    left upper arm   Nicotine use disorder 06/06/2013   smokeless   Obesity, unspecified 06/06/2013   Osteopenia    Other and unspecified hyperlipidemia 01/19/2014   Preventative health care 09/19/2013   Prostate cancer (HCC) 12/26/2020   Right heart enlargement 09/18/2014   Testicular cancer (HCC)    Tinnitus 03/24/2017   Valvular heart disease 09/18/2014    Past Surgical History:  Procedure Laterality Date   BASAL CELL CARCINOMA EXCISION     Within 6 months   COLONOSCOPY  2020   GM-MAC-goly(good)-HPP/TA - 3 yr recall   POLYPECTOMY     SKIN CANCER EXCISION     Within 6 months   SURGERY SCROTAL / TESTICULAR  1999   cancer   WISDOM TOOTH EXTRACTION  1982    Family History  Problem Relation Age of Onset   Diabetes Mother    Breast  cancer Mother    Bone cancer Mother    Cancer Mother        breast with bone mets   Depression Mother    Hypertension Father    Prostate cancer Father    Hyperlipidemia Father    Cancer Father        prostate   Colon polyps Father 9   Hypertension Brother    Gallstones Brother    Anxiety disorder Maternal Aunt        social anxiety, anger   Stroke Maternal Uncle 29   Prostate cancer Maternal Grandfather    Stroke Paternal Grandfather    Anxiety disorder Daughter    ADD / ADHD Daughter    Bulemia Daughter    Depression Son    Heart attack Neg Hx    Sudden death Neg Hx    Colon cancer Neg Hx    Stomach cancer Neg Hx    Rectal cancer Neg Hx     Esophageal cancer Neg Hx     Social History   Socioeconomic History   Marital status: Married    Spouse name: Not on file   Number of children: 2   Years of education: Not on file   Highest education level: Master's degree (e.g., MA, MS, MEng, MEd, MSW, MBA)  Occupational History   Occupation: finance  Tobacco Use   Smoking status: Former    Types: Cigarettes   Smokeless tobacco: Current    Types: Snuff, Chew    Last attempt to quit: 02/28/2014   Tobacco comments:    30 years ago socially/ 06/10/19 pt  uses oral nicotine chew tobacco free   Vaping Use   Vaping status: Never Used  Substance and Sexual Activity   Alcohol use: Yes    Alcohol/week: 14.0 standard drinks of alcohol    Types: 14 Standard drinks or equivalent per week    Comment: 2 drinks per day varies    Drug use: No   Sexual activity: Yes    Comment: lives with wife, travels with work, no dietary restrictions , exercise regular  Other Topics Concern   Not on file  Social History Narrative   Works at [employer]: Low Risk  (06/01/2024)   Overall Financial Resource Strain (CARDIA)    Difficulty of Paying Living Expenses: Not hard at all  Food Insecurity: No Food Insecurity (06/01/2024)   Hunger Vital Sign    Worried About Running Out of Food in the Last Year: Never true    Ran Out of Food in the Last Year: Never true  Transportation Needs: No Transportation Needs (06/01/2024)   PRAPARE - Administrator, Civil Service (Medical): No    Lack of Transportation (Non-Medical): No  Physical Activity: Insufficiently Active (06/01/2024)   Exercise Vital Sign    Days of Exercise per Week: 4 days    Minutes of Exercise per Session: 30 min  Stress: No Stress Concern Present (06/01/2024)   [employer] of Occupational Health - Occupational Stress Questionnaire    Feeling of Stress: Not at all  Social Connections: Moderately Integrated (06/01/2024)   Social  Connection and Isolation Panel    Frequency of Communication with Friends and Family: More than three times a week    Frequency of Social Gatherings with Friends and Family: Twice a week    Attends Religious Services: Never    Database administrator or Organizations: Yes  Attends Banker Meetings: 1 to 4 times per year    Marital Status: Married  Catering manager Violence: Not At Risk (04/28/2023)   Humiliation, Afraid, Rape, and Kick questionnaire    Fear of Current or Ex-Partner: No    Emotionally Abused: No    Physically Abused: No    Sexually Abused: No    Outpatient Medications Prior to Visit  Medication Sig Dispense Refill   allopurinol  (ZYLOPRIM ) 100 MG tablet TAKE 1 TABLET(100 MG) BY MOUTH TWICE DAILY (Patient taking differently: 100 mg daily. TAKE 1 TABLET(100 MG) BY MOUTH TWICE DAILY) 180 tablet 1   amLODipine  (NORVASC ) 10 MG tablet TAKE 1 TABLET DAILY 90 tablet 3   atorvastatin  (LIPITOR) 20 MG tablet TAKE 1 TABLET DAILY 90 tablet 3   Fluorouracil 5 % SOLN Apply topically.     glucose blood (ONETOUCH ULTRA) test strip USE TO CHECK BLOOD SUGAR DAILY AS DIRECTED. 100 strip 0   metFORMIN  (GLUCOPHAGE ) 1000 MG tablet TAKE 1 TABLET TWICE A DAY WITH MEALS 180 tablet 3   valsartan  (DIOVAN ) 320 MG tablet Take 1 tablet (320 mg total) by mouth daily. Needs appt 90 tablet 0   tirzepatide  (MOUNJARO ) 7.5 MG/0.5ML Pen Inject 7.5 mg into the skin once a week. 6 mL 1   VITAMIN D  PO Take 3,000 Units by mouth daily.     Vitamin D , Ergocalciferol , (DRISDOL ) 1.25 MG (50000 UNIT) CAPS capsule Take 1 capsule (50,000 Units total) by mouth every 7 (seven) days. 12 capsule 0   No facility-administered medications prior to visit.    Allergies  Allergen Reactions   Januvia  [Sitagliptin ] Rash    ROS    See HPI Objective:    Physical Exam Constitutional:      General: He is not in acute distress.    Appearance: He is well-developed.  HENT:     Head: Normocephalic and  atraumatic.  Cardiovascular:     Rate and Rhythm: Normal rate and regular rhythm.     Heart sounds: No murmur heard. Pulmonary:     Effort: Pulmonary effort is normal. No respiratory distress.     Breath sounds: Normal breath sounds. No wheezing or rales.  Skin:    General: Skin is warm and dry.  Neurological:     Mental Status: He is alert and oriented to person, place, and time.  Psychiatric:        Behavior: Behavior normal.        Thought Content: Thought content normal.      BP 139/67 (BP Location: Right Arm, Patient Position: Sitting, Cuff Size: Normal)   Pulse 65   Temp 98.2 F (36.8 C) (Oral)   Resp 12   Ht 5' 11 (1.803 m)   Wt 199 lb 12.8 oz (90.6 kg) Comment: with shoes  SpO2 100%   BMI 27.87 kg/m  Wt Readings from Last 3 Encounters:  06/01/24 199 lb 12.8 oz (90.6 kg)  11/28/23 194 lb 9.6 oz (88.3 kg)  11/05/23 194 lb (88 kg)       Assessment & Plan:   Problem List Items Addressed This Visit       Unprioritized   Vitamin D  deficiency   Completed the 12 week course of 49999.  Update level.       Relevant Orders   Vitamin D  (25 hydroxy)   Prostate cancer (HCC)   Requesting follow up PSA today.  Has follow up scheduled with Urology.       Primary hypertension  Stable on amlodipine  and valsartan . Continue same.       OSA (obstructive sleep apnea)   Not snoring.  Has not used cpap in years. Had previously negative sleep study.        Nicotine use disorder   His is interested in quitting.        Hyperlipidemia   Lab Results  Component Value Date   CHOL 80 02/05/2023   HDL 36.60 (L) 02/05/2023   LDLCALC 24 02/05/2023   LDLDIRECT 51.0 12/18/2020   TRIG 95.0 02/05/2023   CHOLHDL 2 02/05/2023   Due for lipid panel. Continue lipitor.       Relevant Orders   Lipid panel   DM (diabetes mellitus), type 2 (HCC) - Primary   Lab Results  Component Value Date   HGBA1C 6.2 11/28/2023   HGBA1C 5.9 05/26/2023   HGBA1C 6.1 02/05/2023   Lab  Results  Component Value Date   MICROALBUR 5.7 (H) 11/28/2023   LDLCALC 24 02/05/2023   CREATININE 1.20 11/28/2023   Doing well on mounjaro  7.5mg  and metformin . Update A1C.        Relevant Orders   HgB A1c   Comp Met (CMET)   Urine Microalbumin w/creat. ratio   Other Visit Diagnoses       Elevated PSA       Relevant Orders   PSA     Pneumococcal vaccination given       Relevant Orders   Pneumococcal conjugate vaccine 20-valent (Prevnar 20)       I have discontinued Belvie PARAS. Wieser's VITAMIN D  PO and Vitamin D  (Ergocalciferol ). I am also having him maintain his OneTouch Ultra, allopurinol , Fluorouracil, atorvastatin , amLODipine , metFORMIN , and valsartan .  No orders of the defined types were placed in this encounter.

## 2024-06-01 NOTE — Assessment & Plan Note (Signed)
 His is interested in quitting.

## 2024-06-01 NOTE — Assessment & Plan Note (Signed)
 Completed the 12 week course of 49999.  Update level.

## 2024-06-01 NOTE — Assessment & Plan Note (Signed)
 Requesting follow up PSA today.  Has follow up scheduled with Urology.

## 2024-06-01 NOTE — Assessment & Plan Note (Signed)
 Lab Results  Component Value Date   CHOL 80 02/05/2023   HDL 36.60 (L) 02/05/2023   LDLCALC 24 02/05/2023   LDLDIRECT 51.0 12/18/2020   TRIG 95.0 02/05/2023   CHOLHDL 2 02/05/2023   Due for lipid panel. Continue lipitor.

## 2024-06-02 ENCOUNTER — Ambulatory Visit: Payer: Self-pay | Admitting: Family

## 2024-06-02 DIAGNOSIS — E559 Vitamin D deficiency, unspecified: Secondary | ICD-10-CM

## 2024-06-02 LAB — COMPREHENSIVE METABOLIC PANEL WITH GFR
ALT: 40 U/L (ref 0–53)
AST: 26 U/L (ref 0–37)
Albumin: 4.6 g/dL (ref 3.5–5.2)
Alkaline Phosphatase: 104 U/L (ref 39–117)
BUN: 12 mg/dL (ref 6–23)
CO2: 28 meq/L (ref 19–32)
Calcium: 10.9 mg/dL — ABNORMAL HIGH (ref 8.4–10.5)
Chloride: 101 meq/L (ref 96–112)
Creatinine, Ser: 1.13 mg/dL (ref 0.40–1.50)
GFR: 71.32 mL/min (ref >60.00)
Glucose, Bld: 138 mg/dL — ABNORMAL HIGH (ref 70–99)
Potassium: 3.8 meq/L (ref 3.5–5.1)
Sodium: 138 meq/L (ref 135–145)
Total Bilirubin: 1.8 mg/dL — ABNORMAL HIGH (ref 0.2–1.2)
Total Protein: 7.2 g/dL (ref 6.0–8.3)

## 2024-06-02 LAB — LIPID PANEL
Cholesterol: 94 mg/dL (ref 0–200)
HDL: 44.9 mg/dL (ref >39.00)
LDL Cholesterol: 28 mg/dL (ref 0–99)
NonHDL: 48.66
Total CHOL/HDL Ratio: 2
Triglycerides: 103 mg/dL (ref 0.0–149.0)
VLDL: 20.6 mg/dL (ref 0.0–40.0)

## 2024-06-02 LAB — HEMOGLOBIN A1C: Hgb A1c MFr Bld: 6.2 % (ref 4.6–6.5)

## 2024-06-02 LAB — PSA: PSA: 14.17 ng/mL — ABNORMAL HIGH (ref 0.10–4.00)

## 2024-06-02 LAB — MICROALBUMIN / CREATININE URINE RATIO
Creatinine,U: 80.8 mg/dL
Microalb Creat Ratio: 49.9 mg/g — ABNORMAL HIGH (ref 0.0–30.0)
Microalb, Ur: 4 mg/dL — ABNORMAL HIGH (ref 0.0–1.9)

## 2024-06-02 LAB — VITAMIN D 25 HYDROXY (VIT D DEFICIENCY, FRACTURES): VITD: 29.1 ng/mL — ABNORMAL LOW (ref 30.00–100.00)

## 2024-06-02 MED ORDER — VITAMIN D3 75 MCG (3000 UT) PO TABS: 1.0000 | ORAL_TABLET | Freq: Every day | ORAL | Status: AC

## 2024-06-07 ENCOUNTER — Encounter: Payer: Self-pay | Admitting: Urology

## 2024-06-10 ENCOUNTER — Ambulatory Visit
Admission: RE | Admit: 2024-06-10 | Discharge: 2024-06-10 | Disposition: A | Discharge status: Home / Self Care | Source: Ambulatory Visit | Attending: Urology

## 2024-06-10 DIAGNOSIS — C61 Malignant neoplasm of prostate: Secondary | ICD-10-CM | POA: Diagnosis not present

## 2024-06-10 MED ORDER — GADOPICLENOL 0.5 MMOL/ML IV SOLN
9.0000 mL | Freq: Once | INTRAVENOUS | Status: AC | PRN
  Administered 2024-06-10: 9 mL via INTRAVENOUS

## 2024-08-13 ENCOUNTER — Other Ambulatory Visit (HOSPITAL_COMMUNITY): Payer: Self-pay

## 2024-08-16 ENCOUNTER — Other Ambulatory Visit (HOSPITAL_COMMUNITY): Payer: Self-pay

## 2024-08-18 ENCOUNTER — Other Ambulatory Visit: Payer: Self-pay | Admitting: Family Medicine

## 2024-08-18 ENCOUNTER — Encounter: Payer: Self-pay | Admitting: Family

## 2024-08-19 ENCOUNTER — Ambulatory Visit: Payer: Self-pay

## 2024-08-19 ENCOUNTER — Encounter: Payer: Self-pay | Admitting: Adult Health

## 2024-08-19 ENCOUNTER — Ambulatory Visit: Admitting: Student

## 2024-08-19 ENCOUNTER — Ambulatory Visit: Admitting: Adult Health

## 2024-08-19 VITALS — BP 130/70 | HR 66 | Temp 98.3°F | Ht 71.0 in | Wt 200.0 lb

## 2024-08-19 DIAGNOSIS — F17200 Nicotine dependence, unspecified, uncomplicated: Secondary | ICD-10-CM

## 2024-08-19 DIAGNOSIS — I10 Essential (primary) hypertension: Secondary | ICD-10-CM

## 2024-08-19 DIAGNOSIS — Z789 Other specified health status: Secondary | ICD-10-CM | POA: Diagnosis not present

## 2024-08-19 NOTE — Progress Notes (Signed)
 Subjective:    Patient ID: Steve Beasley, male    DOB: 02-09-1966, 58 y.o.   MRN: [id number]  HPI Discussed the use of AI scribe software for clinical note transcription with the patient, who gave verbal consent to proceed.  History of Present Illness   Steve Beasley is a 58 year old male with hypertension who presents with concerns about elevated blood pressure and arm spasms.  He experiences elevated blood pressure readings at home, with systolic values reaching 160/100 mmHg before bed and 150/100 mmHg in the morning, despite taking amlodipine  10 mg and Diovan  320 mg regularly. There is a discrepancy in readings between his arms, with the left arm showing higher values. He experiences arm spasms and a visible pulse in the right inner arm, which began during a memorial service last week and recurred a few days ago. He also has a headache, possibly related to caffeine withdrawal, as he has not consumed caffeine today.  Approximately two weeks ago, he faced significant stress due to work-related layoffs, the death of his mother-in-law, and his son's mental health issues. 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 He has a history of heavy smokeless tobacco use and high caffeine consumption, which may affect his blood pressure.        Review of Systems See HPI   Past Medical History:  Diagnosis Date   Allergy-induced asthma    Anxiety state, unspecified 09/19/2013   Atypical chest pain 11/13/2014   Basal cell carcinoma of scalp 09/09/2016   and back   Diabetes mellitus without complication (HCC)    on meds   Fatty liver disease, nonalcoholic 09/19/2013   Gout 09/18/2014   Hypertension    Melanoma (HCC)    left upper arm   Nicotine use disorder 06/06/2013   smokeless   Obesity, unspecified 06/06/2013   Osteopenia    Other and unspecified hyperlipidemia 01/19/2014   Preventative health care 09/19/2013   Prostate cancer (HCC) 12/26/2020   Right heart enlargement  09/18/2014   Testicular cancer (HCC)    Tinnitus 03/24/2017   Valvular heart disease 09/18/2014    Social History   Socioeconomic History   Marital status: Married    Spouse name: Not on file   Number of children: 2   Years of education: Not on file   Highest education level: Master's degree (e.g., MA, MS, MEng, MEd, MSW, MBA)  Occupational History   Occupation: finance  Tobacco Use   Smoking status: Former    Types: Cigarettes   Smokeless tobacco: Current    Types: Snuff, Chew    Last attempt to quit: 02/28/2014   Tobacco comments:    30 years ago socially/ 06/10/19 pt  uses oral nicotine chew tobacco free   Vaping Use   Vaping status: Never Used  Substance and Sexual Activity   Alcohol use: Yes    Alcohol/week: 14.0 standard drinks of alcohol    Types: 14 Standard drinks or equivalent per week    Comment: 2 drinks per day varies    Drug use: No   Sexual activity: Yes    Comment: lives with wife, travels with work, no dietary restrictions , exercise regular  Other Topics Concern   Not on file  Social History Narrative   Works at [employer]: Low Risk  (06/01/2024)   Overall Financial Resource Strain (CARDIA)    Difficulty of Paying Living Expenses: Not hard  at all  Food Insecurity: No Food Insecurity (06/01/2024)   Hunger Vital Sign    Worried About Running Out of Food in the Last Year: Never true    Ran Out of Food in the Last Year: Never true  Transportation Needs: No Transportation Needs (06/01/2024)   PRAPARE - Administrator, Civil Service (Medical): No    Lack of Transportation (Non-Medical): No  Physical Activity: Insufficiently Active (06/01/2024)   Exercise Vital Sign    Days of Exercise per Week: 4 days    Minutes of Exercise per Session: 30 min  Stress: No Stress Concern Present (06/01/2024)   [employer] of Occupational Health - Occupational Stress Questionnaire    Feeling of Stress: Not at  all  Social Connections: Moderately Integrated (06/01/2024)   Social Connection and Isolation Panel    Frequency of Communication with Friends and Family: More than three times a week    Frequency of Social Gatherings with Friends and Family: Twice a week    Attends Religious Services: Never    Database Administrator or Organizations: Yes    Attends Banker Meetings: 1 to 4 times per year    Marital Status: Married  Catering Manager Violence: Not At Risk (04/28/2023)   Humiliation, Afraid, Rape, and Kick questionnaire    Fear of Current or Ex-Partner: No    Emotionally Abused: No    Physically Abused: No    Sexually Abused: No    Past Surgical History:  Procedure Laterality Date   BASAL CELL CARCINOMA EXCISION     Within 6 months   COLONOSCOPY  2020   GM-MAC-goly(good)-HPP/TA - 3 yr recall   POLYPECTOMY     SKIN CANCER EXCISION     Within 6 months   SURGERY SCROTAL / TESTICULAR  1999   cancer   WISDOM TOOTH EXTRACTION  1982    Family History  Problem Relation Age of Onset   Diabetes Mother    Breast cancer Mother    Bone cancer Mother    Cancer Mother        breast with bone mets   Depression Mother    Hypertension Father    Prostate cancer Father    Hyperlipidemia Father    Cancer Father        prostate   Colon polyps Father 65   Hypertension Brother    Gallstones Brother    Anxiety disorder Maternal Aunt        social anxiety, anger   Stroke Maternal Uncle 34   Prostate cancer Maternal Grandfather    Stroke Paternal Grandfather    Anxiety disorder Daughter    ADD / ADHD Daughter    Bulemia Daughter    Depression Son    Heart attack Neg Hx    Sudden death Neg Hx    Colon cancer Neg Hx    Stomach cancer Neg Hx    Rectal cancer Neg Hx    Esophageal cancer Neg Hx     Allergies  Allergen Reactions   Januvia  [Sitagliptin ] Rash    Current Outpatient Medications on File Prior to Visit  Medication Sig Dispense Refill   allopurinol  (ZYLOPRIM )  100 MG tablet TAKE 1 TABLET(100 MG) BY MOUTH TWICE DAILY 180 tablet 1   amLODipine  (NORVASC ) 10 MG tablet TAKE 1 TABLET DAILY 90 tablet 3   atorvastatin  (LIPITOR) 20 MG tablet TAKE 1 TABLET DAILY 90 tablet 3   Cholecalciferol (VITAMIN D3) 75 MCG (3000 UT) TABS Take  1 tablet by mouth daily at 6 (six) AM.     metFORMIN  (GLUCOPHAGE ) 1000 MG tablet TAKE 1 TABLET TWICE A DAY WITH MEALS 180 tablet 3   tadalafil (CIALIS) 20 MG tablet Take 10 mg by mouth daily as needed.     tirzepatide  (MOUNJARO ) 7.5 MG/0.5ML Pen INJECT 7.5 MG UNDER THE SKIN WEEKLY 6 mL 1   valsartan  (DIOVAN ) 320 MG tablet TAKE 1 TABLET DAILY (NEED APPOINTMENT) 90 tablet 0   Fluorouracil 5 % SOLN Apply topically.     glucose blood (ONETOUCH ULTRA) test strip USE TO CHECK BLOOD SUGAR DAILY AS DIRECTED. 100 strip 0   No current facility-administered medications on file prior to visit.    BP 130/70   Pulse 66   Temp 98.3 F (36.8 C) (Oral)   Ht 5' 11 (1.803 m)   Wt 200 lb (90.7 kg)   SpO2 98%   BMI 27.89 kg/m       Objective:   Physical Exam Vitals and nursing note reviewed.  Constitutional:      Appearance: Normal appearance.  Cardiovascular:     Rate and Rhythm: Normal rate and regular rhythm.     Pulses: Normal pulses.     Heart sounds: Normal heart sounds.  Pulmonary:     Effort: Pulmonary effort is normal.     Breath sounds: Normal breath sounds.  Skin:    General: Skin is warm and dry.  Neurological:     General: No focal deficit present.     Mental Status: He is alert and oriented to person, place, and time.  Psychiatric:        Mood and Affect: Mood normal.        Behavior: Behavior normal.        Thought Content: Thought content normal.        Judgment: Judgment normal.         Assessment & Plan:   Discussed the use of AI scribe software for clinical note transcription with the patient, who gave verbal consent to proceed.  History of Present Illness   Steve Beasley is a 58 year old male  with hypertension who presents with concerns about elevated blood pressure and arm spasms.  He experiences elevated blood pressure readings at home, with systolic values reaching 160/100 mmHg before bed and 150/100 mmHg in the morning, despite taking amlodipine  10 mg and Diovan  320 mg regularly. There is a discrepancy in readings between his arms, with the left arm showing higher values. He experiences arm spasms and a visible pulse in the right inner arm, which began during a memorial service last week and recurred a few days ago. He also has a headache, possibly related to caffeine withdrawal, as he has not consumed caffeine today.  Approximately two weeks ago, he faced significant stress due to work-related layoffs, the death of his mother-in-law, and his son's mental health issues. 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 He has a history of heavy smokeless tobacco use and high caffeine consumption, which may affect his blood pressure.     I personally spent a total of 31 minutes in the care of the patient today including preparing to see the patient, getting/reviewing separately obtained history, performing a medically appropriate exam/evaluation, counseling and educating, and documenting clinical information in the EHR.

## 2024-08-19 NOTE — Telephone Encounter (Signed)
 FYI Only or Action Required?: FYI only for provider: appointment scheduled on 08/19/24 with NP Harlene.  Patient was last seen in primary care on 06/01/2024 by Daryl Setter, NP.  Called Nurse Triage reporting Hypertension and Irregular Heart Beat.  Symptoms began a week ago.  Interventions attempted: Prescription medications: amlodipine , valsartan .  Symptoms are: hypertension 160/90; left brachial pulse feels bounding positionally for a few minutes, pulse in left arm is 10-15 BPM faster than right arm gradually worsening.  Triage Disposition: See PCP When Office is Open (Within 3 Days)  Patient/caregiver understands and will follow disposition?: Yes          Copied from CRM #[id number]. Topic: Clinical - Red Word Triage >> Aug 19, 2024  7:44 AM Mia F wrote: Red Word that prompted transfer to Nurse Triage: Pt says he has a bounding pulse in his left arm and elevated blood pressure. He says his bp was reading 160/90 while taking on bp medications. No other symptoms Reason for Disposition  Systolic BP >= 160 OR Diastolic >= 100  Answer Assessment - Initial Assessment Questions 1. BLOOD PRESSURE: What is your blood pressure? Did you take at least two measurements 5 minutes apart?     160/90; 152/85  2. ONSET: When did you take your blood pressure?     Taken last night: 150-160 SBP, lowest reading was 145. Readings also taken 30 minutes ago.  3. HOW: How did you take your blood pressure? (e.g., automatic home BP monitor, visiting nurse)     Automatic home BP monitor.  4. HISTORY: Do you have a history of high blood pressure?     Yes.  5. MEDICINES: Are you taking any medicines for blood pressure? Have you missed any doses recently?     Amlodipine  and valsartan . No missed doses.  6. OTHER SYMPTOMS: Do you have any symptoms? (e.g., blurred vision, chest pain, difficulty breathing, headache, weakness)     Bounding pulse felt in left arm (last a few minutes and  resolves with positional adjustment), pulse discrepancy 10-15 BPM faster in left arm compared to right arm). Denies chest pain, SOB, unilateral numbness or weakness, changes in speech or vision, headache, dizziness.  Protocols used: Blood Pressure - High-A-AH

## 2024-08-20 ENCOUNTER — Other Ambulatory Visit (HOSPITAL_COMMUNITY): Payer: Self-pay

## 2024-08-24 ENCOUNTER — Telehealth: Payer: Self-pay

## 2024-08-24 NOTE — Telephone Encounter (Signed)
 Pharmacy Patient Advocate Encounter   Received notification from Onbase that prior authorization for MOUNJARO  7.5 MG/0.5ML Champ SOAJ is required/requested.   Insurance verification completed.   The patient is insured through [employer].   Per test claim: PA required; PA submitted to above mentioned insurance via Latent Key/confirmation #/EOC [id number] Status is pending

## 2024-08-30 NOTE — Telephone Encounter (Signed)
 Pharmacy Patient Advocate Encounter  Received notification from [facility] that Prior Authorization for Mounjaro  7.5MG /0.5ML auto-injectors  has been APPROVED from 07/26/2024 to 08/25/2025   PA #/Case ID/Reference #: [id number];Dujuld:Jeemnczi;Review Type:Prior Auth;Coverage Start Date:07/26/2024;Coverage End Date:08/25/2025;

## 2024-10-04 ENCOUNTER — Ambulatory Visit: Payer: Self-pay

## 2024-10-04 NOTE — Telephone Encounter (Signed)
 FYI Only or Action Required?: FYI only for provider: appointment scheduled on 10/07/24.  Patient was last seen in primary care on 08/19/2024 by Merna Huxley, NP.  Called Nurse Triage reporting Hypertension.  Symptoms began about a month ago.  Interventions attempted: Prescription medications: amlodipine  and valsartan .  Symptoms are: gradually worsening.  Triage Disposition: See PCP When Office is Open (Within 3 Days)  Patient/caregiver understands and will follow disposition?: Yes    BP started creeping up 2 months ago. SBP has been in 150 to 170's recently. 171/92 then 164/85 taken 5 minutes apart this morning. This afternoon 146/66.  No CP, SOB, numbness, weakness, tingling or confusion. Mild headache. Had a root canal recently and concerned it may be causing elevated readings.   Taking amlodipine  and valsartan  currently. Was taking hydrochlorothiazide but stopped after taking Monjauro and lost some weight and stopped taking the med. Scheduled appt with different provider at home office d/t no PCP availability within timeframe. Advised UC or ED for worsening symptoms.     Copied from CRM [id number]. Topic: Clinical - Red Word Triage >> Oct 04, 2024  4:41 PM Roselie BROCKS wrote: Red Word that prompted transfer to Nurse Triage: Patient states his blood pressure is staying high, and feeling bad. Reason for Disposition  Systolic BP >= 160 OR Diastolic >= 100  Answer Assessment - Initial Assessment Questions 1. BLOOD PRESSURE: What is your blood pressure? Did you take at least two measurements 5 minutes apart?     171/92, 164/85 taken 5 minutes apart this morning. This afternoon 146/66.   2. ONSET: When did you take your blood pressure?     This morning and this afternoon.  3. HOW: How did you take your blood pressure? (e.g., automatic home BP monitor, visiting nurse)     Electric arm cuff  4. HISTORY: Do you have a history of high blood pressure?     Yes  5. MEDICINES:  Are you taking any medicines for blood pressure? Have you missed any doses recently?     Amlodipine  and valsartan    6. OTHER SYMPTOMS: Do you have any symptoms? (e.g., blurred vision, chest pain, difficulty breathing, headache, weakness)     Dull headache  Protocols used: Blood Pressure - High-A-AH

## 2024-10-05 NOTE — Progress Notes (Signed)
 [facility], [facility] [employer] [address], [address] [place], [place] [address] [phone]  Date:  10/07/2024   Name:  Steve Beasley   DOB:  October 06, 1965   MRN:  [id number]  PCP:  Domenica Harlene LABOR, MD    Chief Complaint: Hypertension (Onset  it happened back in November, was seen by the doctor and it was normal. Headaches /140-150 at night )   History of Present Illness:  Steve Beasley is a 59 y.o. very pleasant male patient who presents with the following:  Primary patient of my partner Dr.Blyth seen today with concern of elevated blood pressure.  Patient notes he actually tends to see Eleanor Jester most frequently I have not seen him myself previously. He was seen at another primary care location on 11/20 with concern of high blood pressure at home and a feeling of spasms in his arms He does have a history of a lot of stress related to work and some family issues in addition to his job  He has history of diabetes, hyperlipidemia in addition to hypertension He also has prostate cancer which is being monitored at this time per urology  Amlodipine  10 Valsartan  320 Atorvastatin  20 Metformin  1000 twice daily Mounjaro  7.5  Lab Results  Component Value Date   HGBA1C 6.2 06/01/2024    Wt Readings from Last 3 Encounters:  10/07/24 201 lb 9.6 oz (91.4 kg)  08/19/24 200 lb (90.7 kg)  06/01/24 199 lb 12.8 oz (90.6 kg)   Most recent EKG on chart for 2016 He did do a Myoview in 2016 as well which was negative Patient also pointed out that he had a more recent stress done per [facility] in 2021  Discussed the use of AI scribe software for clinical note transcription with the patient, who gave verbal consent to proceed.  History of Present Illness Steve Beasley is a 59 year old male with hypertension who presents with blood pressure concerns.  He has been experiencing elevated blood pressure readings at home, reaching as high as 170-180  mmHg in the evenings, which he attributes to stress and lifestyle factors. He uses a Multimedia Program[employer] at home. During a recent visit, his blood pressure was recorded at 135 mmHg by his grandfather.  He had a recent episode involving an inflamed tooth requiring a root canal, during which he took a high dosage of Advil for pain, leading to headaches. During this period, his blood pressure readings were in the 160-170 mmHg range in the evenings, with diastolic readings in the mid to low 80s. He has since stopped taking Advil, and his blood pressure has improved over the last 48-72 hours.  He is currently on amlodipine  10 mg and valsartan  320 mg for blood pressure management. He previously discontinued triamterene  HCTZ 18 months ago after losing 30 pounds and experiencing dizziness post-exercise due to low blood pressure. He has since regained about 10 pounds but is still lower than where he began.  He has a history of prostate cancer, which is low grade and under active surveillance. He also reports a history of toenail fungus affecting one to four toes, for which he has tried topical treatments without success.  He has cut out caffeine to help manage his blood pressure and is a heavy nicotine user, smoking tobacco. He also mentions having a sinus cold with postnasal drip, causing an itchy throat and coughing up green phlegm.  He has a history  of elevated calcium  levels, which have been stable over the years, and a slightly elevated bilirubin. He has undergone various tests, including a parathyroid  hormone test and a bone scan, which were unremarkable. No chest pain or difficulty breathing.    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/26/2023   Elevated bilirubin 07/21/2022   Hyponatremia 07/21/2022   Cervical radiculopathy 12/19/2021   Hyperparathyroidism 12/26/2020   Prostate cancer (HCC) 12/26/2020   Hypercalcemia 07/23/2018    Renal insufficiency 05/10/2018   Low vitamin D  level 05/10/2018   Thrombocytopenia 05/10/2018   Colon polyp 03/23/2018   Tinnitus 03/24/2017   Basal cell carcinoma of scalp 09/09/2016   Skin cancer 02/27/2015   OSA (obstructive sleep apnea) 10/17/2014   Right heart enlargement 09/18/2014   Gout 09/18/2014   Dyspnea 07/25/2014   Hyperlipidemia 01/19/2014   Preventative health care 09/19/2013   Anxiety and depression 09/19/2013   Fatty liver disease, nonalcoholic 09/19/2013   Overweight (BMI 25.0-29.9) 06/06/2013   Nicotine use disorder 06/06/2013   Testicular cancer (HCC) 09/12/2012   DM (diabetes mellitus), type 2 (HCC) 09/12/2012   Osteopenia 09/12/2012   Vitamin D  deficiency 09/12/2012   Primary hypertension 09/12/2012    Past Medical History:  Diagnosis Date   Allergy-induced asthma    Anxiety state, unspecified 09/19/2013   Atypical chest pain 11/13/2014   Basal cell carcinoma of scalp 09/09/2016   and back   Diabetes mellitus without complication (HCC)    on meds   Fatty liver disease, nonalcoholic 09/19/2013   Gout 09/18/2014   Hypertension    Melanoma (HCC)    left upper arm   Nicotine use disorder 06/06/2013   smokeless   Obesity, unspecified 06/06/2013   Osteopenia    Other and unspecified hyperlipidemia 01/19/2014   Preventative health care 09/19/2013   Prostate cancer (HCC) 12/26/2020   Right heart enlargement 09/18/2014   Testicular cancer (HCC)    Tinnitus 03/24/2017   Valvular heart disease 09/18/2014    Past Surgical History:  Procedure Laterality Date   BASAL CELL CARCINOMA EXCISION     Within 6 months   COLONOSCOPY  2020   GM-MAC-goly(good)-HPP/TA - 3 yr recall   POLYPECTOMY     SKIN CANCER EXCISION     Within 6 months   SURGERY SCROTAL / TESTICULAR  1999   cancer   WISDOM TOOTH EXTRACTION  1982    Social History[1]  Family History  Problem Relation Age of Onset   Diabetes Mother    Breast cancer Mother    Bone cancer Mother     Cancer Mother        breast with bone mets   Depression Mother    Hypertension Father    Prostate cancer Father    Hyperlipidemia Father    Cancer Father        prostate   Colon polyps Father 73   Hypertension Brother    Gallstones Brother    Anxiety disorder Maternal Aunt        social anxiety, anger   Stroke Maternal Uncle 65   Prostate cancer Maternal Grandfather    Stroke Paternal Grandfather    Anxiety disorder Daughter    ADD / ADHD Daughter    Bulemia Daughter    Depression Son    Heart attack Neg Hx    Sudden death Neg Hx    Colon cancer Neg Hx    Stomach cancer Neg Hx    Rectal  cancer Neg Hx    Esophageal cancer Neg Hx     Allergies[2]  Medication list has been reviewed and updated.  Medications Ordered Prior to Encounter[3]  Review of Systems:  As per HPI- otherwise negative.   Physical Examination: Vitals:   10/07/24 1345  BP: (!) 142/78  Pulse: 76  SpO2: 98%   Vitals:   10/07/24 1345  Weight: 201 lb 9.6 oz (91.4 kg)  Height: 5' 11 (1.803 m)   Body mass index is 28.12 kg/m. Ideal Body Weight: Weight in (lb) to have BMI = 25: 178.9  GEN: no acute distress.  Mild overweight, looks well  HEENT: Atraumatic, Normocephalic.  Ears and Nose: No external deformity. CV: RRR, No M/G/R. No JVD. No thrill. No extra heart sounds. PULM: CTA B, no wheezes, crackles, rhonchi. No retractions. No resp. distress. No accessory muscle use. ABD: S, NT, ND, +BS. No rebound. No HSM. EXTR: No c/c/e PSYCH: Normally interactive. Conversant.   EKG: Sinus rhythm, possible old infarct noted per machine read Assessment and Plan: Primary hypertension - Plan: triamterene -hydrochlorothiazide (MAXZIDE-25) 37.5-25 MG tablet, EKG 12-Lead  Abnormal EKG - Plan: ECHOCARDIOGRAM COMPLETE  Type 2 diabetes mellitus without complication, without long-term current use of insulin (HCC) - Plan: tirzepatide  (MOUNJARO ) 10 MG/0.5ML Pen  Assessment & Plan Primary  hypertension Blood pressure elevated, likely due to stress, NSAID use, and lifestyle. Current medications include amlodipine  and valsartan . Previous triamterene  HCTZ discontinued due to hypotension. Recent weight gain noted. - Prescribed triamterene  HCTZ at half dose as needed or every other day. - Ordered echocardiogram to assess for ventricular hypertrophy. - Increased Mounjaro  to 10 mg for weight loss and blood pressure management. - Monitor blood pressure and adjust medications based on weight loss and readings.  Type 2 diabetes mellitus A1c increased from 5.6 to 6.2. Current management includes Mounjaro , with dose increase planned for weight loss and glycemic control. - Increased Mounjaro  to 10 mg for better glycemic control and weight loss.  Prostate cancer, active surveillance Low-grade prostate cancer under active surveillance. Recent MRI shows high PI-RADS score, cancer not visible. Prefers surveillance due to previous radiation and concerns about urinary issues. - Continue active surveillance with regular follow-ups and biopsies as scheduled.    Signed Harlene Schroeder, MD     [1]  Social History Tobacco Use   Smoking status: Former    Types: Cigarettes   Smokeless tobacco: Current    Types: Snuff, Chew    Last attempt to quit: 02/28/2014   Tobacco comments:    30 years ago socially/ 06/10/19 pt  uses oral nicotine chew tobacco free   Vaping Use   Vaping status: Never Used  Substance Use Topics   Alcohol use: Yes    Alcohol/week: 14.0 standard drinks of alcohol    Types: 14 Standard drinks or equivalent per week    Comment: 2 drinks per day varies    Drug use: No  [2]  Allergies Allergen Reactions   Januvia  [Sitagliptin ] Rash  [3]  Current Outpatient Medications on File Prior to Visit  Medication Sig Dispense Refill   allopurinol  (ZYLOPRIM ) 100 MG tablet TAKE 1 TABLET(100 MG) BY MOUTH TWICE DAILY 180 tablet 1   amLODipine  (NORVASC ) 10 MG tablet TAKE 1 TABLET  DAILY 90 tablet 3   atorvastatin  (LIPITOR) 20 MG tablet TAKE 1 TABLET DAILY 90 tablet 3   Cholecalciferol (VITAMIN D3) 75 MCG (3000 UT) TABS Take 1 tablet by mouth daily at 6 (six) AM.     Fluorouracil 5 %  SOLN Apply topically.     glucose blood (ONETOUCH ULTRA) test strip USE TO CHECK BLOOD SUGAR DAILY AS DIRECTED. 100 strip 0   metFORMIN  (GLUCOPHAGE ) 1000 MG tablet TAKE 1 TABLET TWICE A DAY WITH MEALS 180 tablet 3   tadalafil (CIALIS) 20 MG tablet Take 10 mg by mouth daily as needed.     valsartan  (DIOVAN ) 320 MG tablet TAKE 1 TABLET DAILY (NEED APPOINTMENT) 90 tablet 0   No current facility-administered medications on file prior to visit.   "

## 2024-10-05 NOTE — Patient Instructions (Addendum)
 It was good to see you today, please keep us  posted regarding your BP We will add back hydrochlorothiazide/triamterene  1/2 tablet daily (also ok to take just as needed for your BP if you like)

## 2024-10-07 ENCOUNTER — Encounter: Payer: Self-pay | Admitting: Family Medicine

## 2024-10-07 ENCOUNTER — Ambulatory Visit: Admitting: Family Medicine

## 2024-10-07 VITALS — BP 142/78 | HR 76 | Ht 71.0 in | Wt 201.6 lb

## 2024-10-07 DIAGNOSIS — R9431 Abnormal electrocardiogram [ECG] [EKG]: Secondary | ICD-10-CM

## 2024-10-07 DIAGNOSIS — I10 Essential (primary) hypertension: Secondary | ICD-10-CM | POA: Diagnosis not present

## 2024-10-07 DIAGNOSIS — Z7985 Long-term (current) use of injectable non-insulin antidiabetic drugs: Secondary | ICD-10-CM | POA: Diagnosis not present

## 2024-10-07 DIAGNOSIS — E119 Type 2 diabetes mellitus without complications: Secondary | ICD-10-CM

## 2024-10-07 MED ORDER — TIRZEPATIDE 10 MG/0.5ML ~~LOC~~ SOAJ: 10.0000 mg | SUBCUTANEOUS | 2 refills | Status: AC

## 2024-10-07 MED ORDER — TRIAMTERENE-HCTZ 37.5-25 MG PO TABS: 0.5000 | ORAL_TABLET | Freq: Every day | ORAL | 3 refills | Status: AC

## 2024-10-19 ENCOUNTER — Encounter (HOSPITAL_COMMUNITY): Payer: Self-pay | Admitting: Family Medicine

## 2024-11-03 ENCOUNTER — Other Ambulatory Visit: Payer: Self-pay | Admitting: Family Medicine

## 2024-12-06 ENCOUNTER — Ambulatory Visit (HOSPITAL_COMMUNITY)
